# Patient Record
Sex: Female | Born: 1945
Health system: Southern US, Community
[De-identification: ages and names within clinical notes are randomized; demographics above are authoritative.]

## PROBLEM LIST (undated history)

## (undated) DIAGNOSIS — D649 Anemia, unspecified: Secondary | ICD-10-CM

## (undated) DIAGNOSIS — Z803 Family history of malignant neoplasm of breast: Secondary | ICD-10-CM

## (undated) DIAGNOSIS — C50919 Malignant neoplasm of unspecified site of unspecified female breast: Secondary | ICD-10-CM

## (undated) DIAGNOSIS — I8390 Asymptomatic varicose veins of unspecified lower extremity: Secondary | ICD-10-CM

## (undated) DIAGNOSIS — E785 Hyperlipidemia, unspecified: Secondary | ICD-10-CM

## (undated) DIAGNOSIS — E669 Obesity, unspecified: Secondary | ICD-10-CM

## (undated) DIAGNOSIS — Z87891 Personal history of nicotine dependence: Secondary | ICD-10-CM

## (undated) DIAGNOSIS — N6019 Diffuse cystic mastopathy of unspecified breast: Secondary | ICD-10-CM

## (undated) DIAGNOSIS — T884XXA Failed or difficult intubation, initial encounter: Secondary | ICD-10-CM

## (undated) DIAGNOSIS — C73 Malignant neoplasm of thyroid gland: Secondary | ICD-10-CM

## (undated) HISTORY — PX: BREAST LUMPECTOMY: SHX2

## (undated) HISTORY — DX: Asymptomatic varicose veins of unspecified lower extremity: I83.90

## (undated) HISTORY — PX: THYROID SURGERY: SHX805

## (undated) HISTORY — PX: VARICOSE VEIN SURGERY: SHX832

## (undated) HISTORY — PX: TUBAL LIGATION: SHX77

## (undated) HISTORY — DX: Malignant neoplasm of thyroid gland: C73

## (undated) HISTORY — PX: FRACTURE SURGERY: SHX138

## (undated) HISTORY — DX: Malignant neoplasm of unspecified site of unspecified female breast: C50.919

## (undated) HISTORY — DX: Personal history of nicotine dependence: Z87.891

## (undated) HISTORY — PX: BREAST SURGERY: SHX581

## (undated) HISTORY — PX: COLONOSCOPY: SHX5424

## (undated) HISTORY — DX: Obesity, unspecified: E66.9

## (undated) HISTORY — DX: Diffuse cystic mastopathy of unspecified breast: N60.19

## (undated) HISTORY — DX: Family history of malignant neoplasm of breast: Z80.3

---

## 1987-02-08 HISTORY — PX: BREAST EXCISIONAL BIOPSY: SUR124

## 2004-08-11 ENCOUNTER — Ambulatory Visit: Payer: Self-pay | Admitting: General Surgery

## 2005-09-08 ENCOUNTER — Ambulatory Visit: Payer: Self-pay | Admitting: General Surgery

## 2006-09-12 ENCOUNTER — Ambulatory Visit: Payer: Self-pay | Admitting: General Surgery

## 2007-02-08 HISTORY — PX: OTHER SURGICAL HISTORY: SHX169

## 2007-09-13 ENCOUNTER — Ambulatory Visit: Payer: Self-pay | Admitting: General Surgery

## 2008-09-15 ENCOUNTER — Ambulatory Visit: Payer: Self-pay | Admitting: General Surgery

## 2009-02-07 HISTORY — PX: COLONOSCOPY: SHX174

## 2009-09-09 ENCOUNTER — Ambulatory Visit: Payer: Self-pay | Admitting: Unknown Physician Specialty

## 2009-09-16 ENCOUNTER — Ambulatory Visit: Payer: Self-pay | Admitting: General Surgery

## 2009-09-17 ENCOUNTER — Ambulatory Visit: Payer: Self-pay | Admitting: General Surgery

## 2010-04-19 ENCOUNTER — Ambulatory Visit: Payer: Self-pay | Admitting: General Surgery

## 2010-09-21 ENCOUNTER — Ambulatory Visit: Payer: Self-pay

## 2011-11-01 ENCOUNTER — Ambulatory Visit: Payer: Self-pay

## 2011-11-04 ENCOUNTER — Ambulatory Visit: Payer: Self-pay

## 2012-03-17 ENCOUNTER — Encounter: Payer: Self-pay | Admitting: General Surgery

## 2012-05-07 ENCOUNTER — Ambulatory Visit: Payer: Self-pay

## 2012-05-15 ENCOUNTER — Ambulatory Visit (INDEPENDENT_AMBULATORY_CARE_PROVIDER_SITE_OTHER): Payer: BC Managed Care – PPO | Admitting: General Surgery

## 2012-05-15 ENCOUNTER — Encounter: Payer: Self-pay | Admitting: General Surgery

## 2012-05-15 VITALS — BP 124/80 | HR 80 | Resp 16 | Ht 66.0 in | Wt 189.0 lb

## 2012-05-15 DIAGNOSIS — Z803 Family history of malignant neoplasm of breast: Secondary | ICD-10-CM

## 2012-05-15 DIAGNOSIS — N6019 Diffuse cystic mastopathy of unspecified breast: Secondary | ICD-10-CM

## 2012-05-15 DIAGNOSIS — Z1231 Encounter for screening mammogram for malignant neoplasm of breast: Secondary | ICD-10-CM

## 2012-05-15 DIAGNOSIS — R920 Mammographic microcalcification found on diagnostic imaging of breast: Secondary | ICD-10-CM | POA: Insufficient documentation

## 2012-05-15 NOTE — Patient Instructions (Addendum)
Patient  To return in five month with bilateral screening mammogram.

## 2012-05-15 NOTE — Progress Notes (Signed)
Patient ID: Tanya Frey, female   DOB: 1945/03/11, 67 y.o.   MRN: [id number]  Chief Complaint  Patient presents with  . Follow-up    mammogram     HPI Tanya Frey is a 67 y.o. female here today for her follow up mammogram done @ [facility] on 05/07/12 cat 2. No new breast problems. She has FCD and family history of breast cancer. 6 months ago she had faint microcalcifications-was felt to cat 3.  HPI  Past Medical History  Diagnosis Date  . Personal history of tobacco use, presenting hazards to health   . Varicose veins   . Asthma   . Diffuse cystic mastopathy   . Breast screening, unspecified   . Mammographic microcalcification 2013    left breast cluster of calcifications are very faint   . Special screening for malignant neoplasms, colon   . Obesity, unspecified   . Family history of malignant neoplasm of breast     Past Surgical History  Procedure Laterality Date  . Vein closure procedure Right 2009  . Varicose vein surgery    . Colonoscopy  2011    Dr. Mechele Collin; colon polyps (tubular adenoma)  . Tubal ligation    . Breast biopsy Bilateral 1989    Family History  Problem Relation Age of Onset  . Breast cancer Daughter     Social History History  Substance Use Topics  . Smoking status: Former Smoker    Quit date: 02/07/1990  . Smokeless tobacco: Never Used  . Alcohol Use: .5 - 2 oz/week    1-4 drink(s) per week     Comment: wine    No Known Allergies  Current Outpatient Prescriptions  Medication Sig Dispense Refill  . simvastatin (ZOCOR) 40 MG tablet Take 40 mg by mouth daily.       No current facility-administered medications for this visit.    Review of Systems Review of Systems  Constitutional: Negative.   Respiratory: Negative.   Cardiovascular: Negative.     Blood pressure 124/80, pulse 80, resp. rate 16, height 5\' 6"  (1.676 m), weight 189 lb (85.73 kg).  Physical Exam Physical Exam  Constitutional: She appears well-developed and  well-nourished.  Neck: Neck supple.  Pulmonary/Chest: Effort normal and breath sounds normal. Right breast exhibits no inverted nipple, no mass, no nipple discharge, no skin change and no tenderness. Left breast exhibits no inverted nipple, no mass, no nipple discharge, no skin change and no tenderness.  Lymphadenopathy:    She has no cervical adenopathy.    She has no axillary adenopathy.    Data Reviewed Mammogram shows no change in there faint microcalcifications  Assessment    Stable exam.     Plan    6 mo f/u with bilammogram        SANKAR,SEEPLAPUTHUR G 05/15/2012, 8:05 PM

## 2012-11-01 ENCOUNTER — Ambulatory Visit: Payer: Self-pay | Admitting: General Surgery

## 2012-11-05 ENCOUNTER — Encounter: Payer: Self-pay | Admitting: General Surgery

## 2012-11-07 ENCOUNTER — Ambulatory Visit: Payer: BC Managed Care – PPO | Admitting: General Surgery

## 2012-11-13 ENCOUNTER — Encounter: Payer: Self-pay | Admitting: General Surgery

## 2012-11-13 ENCOUNTER — Ambulatory Visit (INDEPENDENT_AMBULATORY_CARE_PROVIDER_SITE_OTHER): Payer: BC Managed Care – PPO | Admitting: General Surgery

## 2012-11-13 VITALS — BP 142/90 | HR 78 | Resp 14 | Ht 66.0 in | Wt 191.0 lb

## 2012-11-13 DIAGNOSIS — Z803 Family history of malignant neoplasm of breast: Secondary | ICD-10-CM

## 2012-11-13 DIAGNOSIS — N6019 Diffuse cystic mastopathy of unspecified breast: Secondary | ICD-10-CM

## 2012-11-13 NOTE — Patient Instructions (Addendum)
Continue self breast exams. Call office for any new breast issues or concerns. Follow up one year bilateral screening mammogram and office visit

## 2012-11-13 NOTE — Progress Notes (Signed)
Patient ID: Tanya Frey, female   DOB: 1945/11/14, 67 y.o.   MRN: [id number]  Chief Complaint  Patient presents with  . Follow-up    mammogram    HPI Tanya Frey is a 67 y.o. female who presents for a breast evaluation. The most recent mammogram was done on 10/29/12.Patient does perform regular self breast checks and gets regular mammograms done.  She denies any new problems at this time.   HPI  Past Medical History  Diagnosis Date  . Personal history of tobacco use, presenting hazards to health   . Varicose veins   . Asthma   . Diffuse cystic mastopathy   . Breast screening, unspecified   . Mammographic microcalcification 2013    left breast cluster of calcifications are very faint   . Special screening for malignant neoplasms, colon   . Obesity, unspecified   . Family history of malignant neoplasm of breast     Past Surgical History  Procedure Laterality Date  . Vein closure procedure Right 2009  . Varicose vein surgery    . Colonoscopy  2011    Dr. Mechele Collin; colon polyps (tubular adenoma)  . Tubal ligation    . Breast biopsy Bilateral 1989    Family History  Problem Relation Age of Onset  . Breast cancer Daughter     Social History History  Substance Use Topics  . Smoking status: Former Smoker    Quit date: 02/07/1990  . Smokeless tobacco: Never Used  . Alcohol Use: .5 - 2 oz/week    1-4 drink(s) per week     Comment: wine    No Known Allergies  Current Outpatient Prescriptions  Medication Sig Dispense Refill  . simvastatin (ZOCOR) 40 MG tablet Take 40 mg by mouth daily.       No current facility-administered medications for this visit.    Review of Systems Review of Systems  Constitutional: Negative.   Respiratory: Negative.   Cardiovascular: Negative.     Blood pressure 142/90, pulse 78, resp. rate 14, height 5\' 6"  (1.676 m), weight 191 lb (86.637 kg).  Physical Exam Physical Exam  Constitutional: She is oriented to person, place, and  time. She appears well-developed and well-nourished.  Eyes: Conjunctivae are normal. No scleral icterus.  Neck: Neck supple.  Cardiovascular: Normal rate, regular rhythm and normal heart sounds.   Pulmonary/Chest: Effort normal and breath sounds normal. Right breast exhibits no inverted nipple, no mass, no nipple discharge, no skin change and no tenderness. Left breast exhibits no inverted nipple, no mass, no nipple discharge, no skin change and no tenderness.  Abdominal: Soft. There is no hepatosplenomegaly. There is no tenderness.  Lymphadenopathy:    She has no cervical adenopathy.    She has no axillary adenopathy.  Neurological: She is alert and oriented to person, place, and time.  Skin: Skin is warm and dry.    Data Reviewed Mammogram stable with micro calcifications left breast.  Assessment    Stable    Plan    Follow up one year bilateral screening mammogram and office visit.       Tanya Frey 11/13/2012, 6:39 PM

## 2013-06-14 DIAGNOSIS — D649 Anemia, unspecified: Secondary | ICD-10-CM | POA: Insufficient documentation

## 2013-06-14 DIAGNOSIS — E785 Hyperlipidemia, unspecified: Secondary | ICD-10-CM | POA: Insufficient documentation

## 2013-11-19 ENCOUNTER — Ambulatory Visit: Payer: Self-pay | Admitting: General Surgery

## 2013-11-21 ENCOUNTER — Encounter: Payer: Self-pay | Admitting: General Surgery

## 2013-11-27 ENCOUNTER — Encounter: Payer: Self-pay | Admitting: General Surgery

## 2013-11-27 ENCOUNTER — Ambulatory Visit (INDEPENDENT_AMBULATORY_CARE_PROVIDER_SITE_OTHER): Payer: Commercial Managed Care - HMO | Admitting: General Surgery

## 2013-11-27 VITALS — BP 100/64 | HR 72 | Resp 12 | Ht 65.0 in | Wt 184.0 lb

## 2013-11-27 DIAGNOSIS — Z803 Family history of malignant neoplasm of breast: Secondary | ICD-10-CM

## 2013-11-27 DIAGNOSIS — N6019 Diffuse cystic mastopathy of unspecified breast: Secondary | ICD-10-CM | POA: Insufficient documentation

## 2013-11-27 NOTE — Patient Instructions (Signed)
The patient has been asked to return to the office in one year with a bilateral screening mammogram. Continue self breast exams. Call office for any new breast issues or concerns.  

## 2013-11-27 NOTE — Progress Notes (Signed)
Patient ID: Tanya Frey, female   DOB: May 01, 1945, 68 y.o.   MRN: [id number]  Chief Complaint  Patient presents with  . Follow-up    mammogram    HPI Tanya Frey is a 68 y.o. female who presents for a breast evaluation. The most recent mammogram was done on 11/19/13.  Patient does perform regular self breast checks and gets regular mammograms done.  No new breast issues.  HPI  Past Medical History  Diagnosis Date  . Personal history of tobacco use, presenting hazards to health   . Varicose veins   . Asthma   . Diffuse cystic mastopathy   . Obesity, unspecified   . Family history of malignant neoplasm of breast     Past Surgical History  Procedure Laterality Date  . Vein closure procedure Right 2009  . Varicose vein surgery    . Colonoscopy  2011    Dr. [name]; colon polyps (tubular adenoma)  . Tubal ligation    . Breast biopsy Bilateral 1989    Family History  Problem Relation Age of Onset  . Breast cancer Daughter     [name]    Social History History  Substance Use Topics  . Smoking status: Former Smoker    Quit date: 02/07/1990  . Smokeless tobacco: Never Used  . Alcohol Use: 0.5 - 2.0 oz/week    1-4 drink(s) per week     Comment: wine    No Known Allergies  Current Outpatient Prescriptions  Medication Sig Dispense Refill  . Calcium-Vitamin D 600-200 MG-UNIT per tablet Take by mouth.      . simvastatin (ZOCOR) 40 MG tablet Take 40 mg by mouth daily.       No current facility-administered medications for this visit.    Review of Systems Review of Systems  Respiratory: Negative.   Cardiovascular: Negative.     Blood pressure 100/64, pulse 72, resp. rate 12, height 5\' 5"  (1.651 m), weight 184 lb (83.462 kg).  Physical Exam Physical Exam  Constitutional: She is oriented to person, place, and time. She appears well-developed and well-nourished.  Eyes: Conjunctivae are normal. No scleral icterus.  Neck: Neck supple.   Cardiovascular: Normal rate, regular rhythm and normal heart sounds.   Pulmonary/Chest: Effort normal and breath sounds normal. Right breast exhibits no inverted nipple, no mass, no nipple discharge, no skin change and no tenderness. Left breast exhibits no inverted nipple, no mass, no nipple discharge, no skin change and no tenderness.  Abdominal: Soft. Bowel sounds are normal. There is no hepatomegaly. There is no tenderness.  Lymphadenopathy:    She has no cervical adenopathy.    She has no axillary adenopathy.  Neurological: She is alert and oriented to person, place, and time.  Skin: Skin is warm and dry.    Data Reviewed Mammogram reviewed and stable.  Assessment    Stable physical exam. FCD. FH breast cancer.     Plan    The patient has been asked to return to the office in one year with a bilateral screening mammogram.     PCP: Ivin Poot 11/27/2013, 4:21 PM

## 2013-12-09 ENCOUNTER — Encounter: Payer: Self-pay | Admitting: General Surgery

## 2014-06-11 DIAGNOSIS — D649 Anemia, unspecified: Secondary | ICD-10-CM | POA: Diagnosis not present

## 2014-06-11 DIAGNOSIS — E780 Pure hypercholesterolemia: Secondary | ICD-10-CM | POA: Diagnosis not present

## 2014-06-17 DIAGNOSIS — Z23 Encounter for immunization: Secondary | ICD-10-CM | POA: Diagnosis not present

## 2014-06-17 DIAGNOSIS — Z0001 Encounter for general adult medical examination with abnormal findings: Secondary | ICD-10-CM | POA: Diagnosis not present

## 2014-08-29 DIAGNOSIS — Z86010 Personal history of colonic polyps: Secondary | ICD-10-CM | POA: Diagnosis not present

## 2014-09-22 ENCOUNTER — Other Ambulatory Visit: Payer: Self-pay

## 2014-09-22 DIAGNOSIS — Z1231 Encounter for screening mammogram for malignant neoplasm of breast: Secondary | ICD-10-CM

## 2014-10-09 ENCOUNTER — Encounter: Payer: Self-pay | Admitting: *Deleted

## 2014-10-10 ENCOUNTER — Ambulatory Visit: Payer: Commercial Managed Care - HMO | Admitting: Anesthesiology

## 2014-10-10 ENCOUNTER — Ambulatory Visit
Admission: RE | Admit: 2014-10-10 | Discharge: 2014-10-10 | Disposition: A | Discharge status: Home / Self Care | Payer: Commercial Managed Care - HMO | Source: Ambulatory Visit | Attending: Unknown Physician Specialty | Admitting: Unknown Physician Specialty

## 2014-10-10 ENCOUNTER — Encounter
Admission: RE | Disposition: A | Discharge status: Home / Self Care | Payer: Self-pay | Source: Ambulatory Visit | Attending: Unknown Physician Specialty

## 2014-10-10 DIAGNOSIS — Z09 Encounter for follow-up examination after completed treatment for conditions other than malignant neoplasm: Secondary | ICD-10-CM | POA: Insufficient documentation

## 2014-10-10 DIAGNOSIS — K649 Unspecified hemorrhoids: Secondary | ICD-10-CM | POA: Diagnosis not present

## 2014-10-10 DIAGNOSIS — Z87891 Personal history of nicotine dependence: Secondary | ICD-10-CM | POA: Diagnosis not present

## 2014-10-10 DIAGNOSIS — E669 Obesity, unspecified: Secondary | ICD-10-CM | POA: Diagnosis not present

## 2014-10-10 DIAGNOSIS — Z803 Family history of malignant neoplasm of breast: Secondary | ICD-10-CM | POA: Insufficient documentation

## 2014-10-10 DIAGNOSIS — Z79899 Other long term (current) drug therapy: Secondary | ICD-10-CM | POA: Insufficient documentation

## 2014-10-10 DIAGNOSIS — K640 First degree hemorrhoids: Secondary | ICD-10-CM | POA: Insufficient documentation

## 2014-10-10 DIAGNOSIS — Z86010 Personal history of colonic polyps: Secondary | ICD-10-CM | POA: Diagnosis not present

## 2014-10-10 DIAGNOSIS — I8390 Asymptomatic varicose veins of unspecified lower extremity: Secondary | ICD-10-CM | POA: Insufficient documentation

## 2014-10-10 DIAGNOSIS — D125 Benign neoplasm of sigmoid colon: Secondary | ICD-10-CM | POA: Diagnosis not present

## 2014-10-10 DIAGNOSIS — K635 Polyp of colon: Secondary | ICD-10-CM | POA: Diagnosis not present

## 2014-10-10 DIAGNOSIS — Z7982 Long term (current) use of aspirin: Secondary | ICD-10-CM | POA: Insufficient documentation

## 2014-10-10 DIAGNOSIS — D649 Anemia, unspecified: Secondary | ICD-10-CM | POA: Insufficient documentation

## 2014-10-10 DIAGNOSIS — Z6829 Body mass index (BMI) 29.0-29.9, adult: Secondary | ICD-10-CM | POA: Diagnosis not present

## 2014-10-10 DIAGNOSIS — J45909 Unspecified asthma, uncomplicated: Secondary | ICD-10-CM | POA: Diagnosis not present

## 2014-10-10 HISTORY — DX: Anemia, unspecified: D64.9

## 2014-10-10 HISTORY — PX: COLONOSCOPY WITH PROPOFOL: SHX5780

## 2014-10-10 SURGERY — COLONOSCOPY WITH PROPOFOL
Anesthesia: General

## 2014-10-10 MED ORDER — LIDOCAINE HCL (CARDIAC) 20 MG/ML IV SOLN
INTRAVENOUS | Status: DC | PRN
Start: 2014-10-10 — End: 2014-10-10
  Administered 2014-10-10: 60 mg via INTRAVENOUS

## 2014-10-10 MED ORDER — PROPOFOL 10 MG/ML IV BOLUS
INTRAVENOUS | Status: DC | PRN
  Administered 2014-10-10: 70 mg via INTRAVENOUS

## 2014-10-10 MED ORDER — PROPOFOL INFUSION 10 MG/ML OPTIME
INTRAVENOUS | Status: DC | PRN
  Administered 2014-10-10: 150 ug/kg/min via INTRAVENOUS

## 2014-10-10 MED ORDER — SODIUM CHLORIDE 0.9 % IV SOLN: INTRAVENOUS | Status: DC

## 2014-10-10 MED ORDER — SODIUM CHLORIDE 0.9 % IV SOLN
INTRAVENOUS | Status: DC
  Administered 2014-10-10: 1000 mL via INTRAVENOUS
  Administered 2014-10-10: 11:00:00 via INTRAVENOUS

## 2014-10-10 MED ORDER — FENTANYL CITRATE (PF) 100 MCG/2ML IJ SOLN
INTRAMUSCULAR | Status: DC | PRN
  Administered 2014-10-10: 50 ug via INTRAVENOUS

## 2014-10-10 MED ORDER — EPHEDRINE SULFATE 50 MG/ML IJ SOLN
INTRAMUSCULAR | Status: DC | PRN
  Administered 2014-10-10 (×2): 5 mg via INTRAVENOUS

## 2014-10-10 NOTE — Transfer of Care (Signed)
Immediate Anesthesia Transfer of Care Note  Patient: Tanya Frey  Procedure(s) Performed: Procedure(s): COLONOSCOPY WITH PROPOFOL (N/A)  Patient Location: PACU and Endoscopy Unit  Anesthesia Type:General  Level of Consciousness: awake  Airway & Oxygen Therapy: Patient Spontanous Breathing  Post-op Assessment: Report given to RN  Post vital signs: stable  Last Vitals:  Filed Vitals:   10/10/14 1013  BP: 138/66  Pulse: 72  Temp: 35.8 C  Resp: 18    Complications: No apparent anesthesia complications

## 2014-10-10 NOTE — H&P (Signed)
   Primary Care Physician:  Madelyn Brunner, MD Primary Gastroenterologist:  Dr. [name]  Pre-Procedure History & Physical: HPI:  Tanya Frey is a 69 y.o. female is here for an colonoscopy.   Past Medical History  Diagnosis Date  . Personal history of tobacco use, presenting hazards to health   . Varicose veins   . Asthma   . Diffuse cystic mastopathy   . Obesity, unspecified   . Family history of malignant neoplasm of breast   . Anemia     Past Surgical History  Procedure Laterality Date  . Vein closure procedure Right 2009  . Varicose vein surgery    . Colonoscopy  2011    Dr. [name]; colon polyps (tubular adenoma)  . Tubal ligation    . Breast biopsy Bilateral 1989    Prior to Admission medications   Medication Sig Start Date End Date Taking? Authorizing Provider  aspirin EC 81 MG tablet Take 81 mg by mouth daily.   Yes Historical Provider, MD  Calcium-Vitamin D 600-200 MG-UNIT per tablet Take by mouth.    Historical Provider, MD  simvastatin (ZOCOR) 40 MG tablet Take 40 mg by mouth daily.    Historical Provider, MD    Allergies as of 09/09/2014  . (No Known Allergies)    Family History  Problem Relation Age of Onset  . Breast cancer Daughter     [name]    Social History   Social History  . Marital Status: Married    Spouse Name: N/A  . Number of Children: N/A  . Years of Education: N/A   Occupational History  . Not on file.   Social History Main Topics  . Smoking status: Former Smoker -- 1.00 packs/day for 10 years    Quit date: 02/07/1990  . Smokeless tobacco: Never Used  . Alcohol Use: 0.5 - 2.0 oz/week    1-4 Standard drinks or equivalent per week     Comment: wine  . Drug Use: No  . Sexual Activity: Not on file   Other Topics Concern  . Not on file   Social History Narrative    Review of Systems: See HPI, otherwise negative ROS  Physical Exam: BP 138/66 mmHg  Pulse 72  Temp(Src) 96.4 F (35.8 C) (Tympanic)  Resp  18  Ht 5\' 5"  (1.651 m)  Wt 81.647 kg (180 lb)  BMI 29.95 kg/m2  SpO2 100% General:   Alert,  pleasant and cooperative in NAD Head:  Normocephalic and atraumatic. Neck:  Supple; no masses or thyromegaly. Lungs:  Clear throughout to auscultation.    Heart:  Regular rate and rhythm. Abdomen:  Soft, nontender and nondistended. Normal bowel sounds, without guarding, and without rebound.   Neurologic:  Alert and  oriented x4;  grossly normal neurologically.  Impression/Plan: Tanya Frey is here for an colonoscopy to be performed for [facility] colon polyps  Risks, benefits, limitations, and alternatives regarding  colonoscopy have been reviewed with the patient.  Questions have been answered.  All parties agreeable.   Gaylyn Cheers, MD  10/10/2014, 11:17 AM

## 2014-10-10 NOTE — Op Note (Signed)
[facility] Gastroenterology Patient Name: Tanya Frey Procedure Date: 10/10/2014 11:11 AM MRN: [id number] Account #: [id number] Date of Birth: Jun 03, 1945 Admit Type: Outpatient Age: 69 Room: [facility] ENDO ROOM 1 Gender: Female Note Status: Finalized Procedure:         Colonoscopy Indications:       Follow-up for history of adenomatous polyps in the colon Providers:         [name], MD Referring MD:      Hewitt Blade. Sarina Ser, MD (Referring MD) Medicines:         Propofol per Anesthesia Complications:     No immediate complications. Procedure:         Pre-Anesthesia Assessment:                    - After reviewing the risks and benefits, the patient was                     deemed in satisfactory condition to undergo the procedure.                    After obtaining informed consent, the colonoscope was                     passed under direct vision. Throughout the procedure, the                     patient's blood pressure, pulse, and oxygen saturations                     were monitored continuously. The Colonoscope was                     introduced through the anus and advanced to the the cecum,                     identified by appendiceal orifice and ileocecal valve. The                     colonoscopy was performed without difficulty. The patient                     tolerated the procedure well. The quality of the bowel                     preparation was excellent. Findings:      Two sessile polyps were found in the sigmoid colon. The polyps were       diminutive in size. These polyps were removed with a jumbo cold forceps.       Resection and retrieval were complete.      Internal hemorrhoids were found during endoscopy. The hemorrhoids were       small and Grade I (internal hemorrhoids that do not prolapse). Impression:        - Two diminutive polyps in the sigmoid colon. Resected and                     retrieved.                    - Internal  hemorrhoids. Recommendation:    - Await pathology results. [name], MD 10/10/2014 11:47:57 AM This report has been signed electronically. Number of Addenda: 0 Note Initiated On: 10/10/2014 11:11 AM Scope Withdrawal Time: 0 hours 13 minutes  11 seconds  Total Procedure Duration: 0 hours 19 minutes 46 seconds       [facility]

## 2014-10-10 NOTE — Anesthesia Preprocedure Evaluation (Signed)
Anesthesia Evaluation  Patient identified by MRN, date of birth, ID band Patient awake    Reviewed: Allergy & Precautions, NPO status , Patient's Chart, lab work & pertinent test results  History of Anesthesia Complications (+) PONV  Airway Mallampati: IV  TM Distance: <3 FB    Comment: Small mouth Dental  (+) Chipped   Pulmonary asthma , former smoker,  breath sounds clear to auscultation  Pulmonary exam normal       Cardiovascular negative cardio ROS Normal cardiovascular exam    Neuro/Psych negative neurological ROS  negative psych ROS   GI/Hepatic negative GI ROS, Neg liver ROS,   Endo/Other  negative endocrine ROS  Renal/GU negative Renal ROS  negative genitourinary   Musculoskeletal negative musculoskeletal ROS (+)   Abdominal Normal abdominal exam  (+)   Peds negative pediatric ROS (+)  Hematology  (+) anemia ,   Anesthesia Other Findings Varicose veins  Reproductive/Obstetrics                             Anesthesia Physical Anesthesia Plan  ASA: II  Anesthesia Plan: General   Post-op Pain Management:    Induction: Intravenous  Airway Management Planned: Nasal Cannula  Additional Equipment:   Intra-op Plan:   Post-operative Plan:   Informed Consent: I have reviewed the patients History and Physical, chart, labs and discussed the procedure including the risks, benefits and alternatives for the proposed anesthesia with the patient or authorized representative who has indicated his/her understanding and acceptance.   Dental advisory given  Plan Discussed with: CRNA and Surgeon  Anesthesia Plan Comments:         Anesthesia Quick Evaluation

## 2014-10-14 ENCOUNTER — Encounter: Payer: Self-pay | Admitting: Unknown Physician Specialty

## 2014-10-14 LAB — SURGICAL PATHOLOGY

## 2014-10-14 NOTE — Anesthesia Postprocedure Evaluation (Signed)
  Anesthesia Post-op Note  Patient: Tanya Frey  Procedure(s) Performed: Procedure(s): COLONOSCOPY WITH PROPOFOL (N/A)  Anesthesia type:General  Patient location: PACU  Post pain: Pain level controlled  Post assessment: Post-op Vital signs reviewed, Patient's Cardiovascular Status Stable, Respiratory Function Stable, Patent Airway and No signs of Nausea or vomiting  Post vital signs: Reviewed and stable  Last Vitals:  Filed Vitals:   10/10/14 1220  BP: 106/67  Pulse: 71  Temp:   Resp: 16    Level of consciousness: awake, alert  and patient cooperative  Complications: No apparent anesthesia complications

## 2014-11-21 ENCOUNTER — Other Ambulatory Visit: Payer: Self-pay | Admitting: General Surgery

## 2014-11-21 ENCOUNTER — Ambulatory Visit
Admission: RE | Admit: 2014-11-21 | Discharge: 2014-11-21 | Disposition: A | Discharge status: Home / Self Care | Payer: Commercial Managed Care - HMO | Source: Ambulatory Visit | Attending: General Surgery | Admitting: General Surgery

## 2014-11-21 DIAGNOSIS — Z1231 Encounter for screening mammogram for malignant neoplasm of breast: Secondary | ICD-10-CM | POA: Insufficient documentation

## 2014-11-26 ENCOUNTER — Other Ambulatory Visit: Payer: Self-pay | Admitting: General Surgery

## 2014-11-26 DIAGNOSIS — R928 Other abnormal and inconclusive findings on diagnostic imaging of breast: Secondary | ICD-10-CM

## 2014-11-27 ENCOUNTER — Ambulatory Visit
Admission: RE | Admit: 2014-11-27 | Discharge: 2014-11-27 | Disposition: A | Discharge status: Home / Self Care | Payer: Commercial Managed Care - HMO | Source: Ambulatory Visit | Attending: General Surgery | Admitting: General Surgery

## 2014-11-27 DIAGNOSIS — R928 Other abnormal and inconclusive findings on diagnostic imaging of breast: Secondary | ICD-10-CM | POA: Diagnosis not present

## 2014-12-04 ENCOUNTER — Encounter: Payer: Self-pay | Admitting: General Surgery

## 2014-12-04 ENCOUNTER — Ambulatory Visit (INDEPENDENT_AMBULATORY_CARE_PROVIDER_SITE_OTHER): Payer: Commercial Managed Care - HMO | Admitting: General Surgery

## 2014-12-04 VITALS — BP 124/72 | HR 68 | Resp 12 | Ht 65.0 in | Wt 188.0 lb

## 2014-12-04 DIAGNOSIS — N6019 Diffuse cystic mastopathy of unspecified breast: Secondary | ICD-10-CM | POA: Diagnosis not present

## 2014-12-04 DIAGNOSIS — Z803 Family history of malignant neoplasm of breast: Secondary | ICD-10-CM

## 2014-12-04 NOTE — Progress Notes (Signed)
Patient ID: Tanya Frey, female   DOB: 1945-09-28, 69 y.o.   MRN: [id number]  Chief Complaint  Patient presents with  . Follow-up    mammmgram    HPI Tanya Frey is a 69 y.o. female who presents for a breast evaluation. The most recent mammogram was done on .  Patient does perform regular self breast checks and gets regular mammograms done.  I have reviewed the history of present illness with the patient.   HPI   Past Medical History  Diagnosis Date  . Personal history of tobacco use, presenting hazards to health   . Varicose veins   . Asthma   . Diffuse cystic mastopathy   . Obesity, unspecified   . Family history of malignant neoplasm of breast   . Anemia     Past Surgical History  Procedure Laterality Date  . Vein closure procedure Right 2009  . Varicose vein surgery    . Colonoscopy  2011    Dr. [name]; colon polyps (tubular adenoma)  . Tubal ligation    . Colonoscopy with propofol N/A 10/10/2014    Procedure: COLONOSCOPY WITH PROPOFOL;  Surgeon: [name], MD;  Location: [facility] ENDOSCOPY;  Service: Endoscopy;  Laterality: N/A;  . Breast biopsy Bilateral 1989    excisional    Family History  Problem Relation Age of Onset  . Breast cancer Daughter     Tanya Frey    Social History Social History  Substance Use Topics  . Smoking status: Former Smoker -- 1.00 packs/day for 10 years    Quit date: 02/07/1990  . Smokeless tobacco: Never Used  . Alcohol Use: 0.5 - 2.0 oz/week    1-4 Standard drinks or equivalent per week     Comment: wine    No Known Allergies  Current Outpatient Prescriptions  Medication Sig Dispense Refill  . aspirin EC 81 MG tablet Take 81 mg by mouth daily.    . Calcium-Vitamin D 600-200 MG-UNIT per tablet Take by mouth.    . simvastatin (ZOCOR) 40 MG tablet Take 40 mg by mouth daily.     No current facility-administered medications for this visit.    Review of Systems Review of Systems  Constitutional: Negative.    Respiratory: Negative.   Cardiovascular: Negative.     Blood pressure 124/72, pulse 68, resp. rate 12, height 5\' 5"  (1.651 m), weight 188 lb (85.276 kg).  Physical Exam Physical Exam  Constitutional: She is oriented to person, place, and time. She appears well-developed and well-nourished.  Eyes: Conjunctivae are normal. No scleral icterus.  Neck: Neck supple.  Cardiovascular: Normal rate, regular rhythm and normal heart sounds.   Pulmonary/Chest: Effort normal and breath sounds normal. Right breast exhibits no inverted nipple, no mass, no nipple discharge, no skin change and no tenderness. Left breast exhibits no inverted nipple, no mass, no nipple discharge, no skin change and no tenderness.  Abdominal: Soft. Bowel sounds are normal. There is no tenderness.  Lymphadenopathy:    She has no cervical adenopathy.    She has no axillary adenopathy.  Neurological: She is alert and oriented to person, place, and time.  Skin: Skin is warm and dry.    Data Reviewed Mammogram reports reviewed, no suspicious findings.   Assessment    Stable exam, history of fibrocystic disease and family history of breast cancer.     Plan    Patient will be asked to return to the office in one year with a bilateral screening mammogram.  PCP:  Fulton Reek 12/04/2014, 9:08 AM

## 2014-12-04 NOTE — Patient Instructions (Addendum)
Patient will be asked to return to the office in one year with a bilateral screening mammogram. Continue monthly self breast exams. Call if any concerns arise.

## 2015-06-11 DIAGNOSIS — E7800 Pure hypercholesterolemia, unspecified: Secondary | ICD-10-CM | POA: Diagnosis not present

## 2015-06-18 DIAGNOSIS — L570 Actinic keratosis: Secondary | ICD-10-CM | POA: Diagnosis not present

## 2015-06-18 DIAGNOSIS — E7800 Pure hypercholesterolemia, unspecified: Secondary | ICD-10-CM | POA: Diagnosis not present

## 2015-06-18 DIAGNOSIS — Z0001 Encounter for general adult medical examination with abnormal findings: Secondary | ICD-10-CM | POA: Diagnosis not present

## 2015-07-21 DIAGNOSIS — L821 Other seborrheic keratosis: Secondary | ICD-10-CM | POA: Diagnosis not present

## 2015-07-21 DIAGNOSIS — D1801 Hemangioma of skin and subcutaneous tissue: Secondary | ICD-10-CM | POA: Diagnosis not present

## 2015-10-15 ENCOUNTER — Other Ambulatory Visit: Payer: Self-pay | Admitting: General Surgery

## 2015-10-15 DIAGNOSIS — Z1231 Encounter for screening mammogram for malignant neoplasm of breast: Secondary | ICD-10-CM

## 2015-10-22 ENCOUNTER — Encounter: Payer: Self-pay | Admitting: *Deleted

## 2015-11-23 ENCOUNTER — Ambulatory Visit
Admission: RE | Admit: 2015-11-23 | Discharge: 2015-11-23 | Disposition: A | Discharge status: Home / Self Care | Payer: Commercial Managed Care - HMO | Source: Ambulatory Visit | Attending: General Surgery | Admitting: General Surgery

## 2015-11-23 DIAGNOSIS — Z1231 Encounter for screening mammogram for malignant neoplasm of breast: Secondary | ICD-10-CM | POA: Diagnosis not present

## 2015-11-24 ENCOUNTER — Encounter: Payer: Self-pay | Admitting: *Deleted

## 2015-11-30 ENCOUNTER — Ambulatory Visit (INDEPENDENT_AMBULATORY_CARE_PROVIDER_SITE_OTHER): Payer: Commercial Managed Care - HMO | Admitting: General Surgery

## 2015-11-30 ENCOUNTER — Encounter: Payer: Self-pay | Admitting: General Surgery

## 2015-11-30 VITALS — BP 122/74 | HR 74 | Resp 12 | Ht 65.0 in | Wt 199.0 lb

## 2015-11-30 DIAGNOSIS — N6011 Diffuse cystic mastopathy of right breast: Secondary | ICD-10-CM | POA: Diagnosis not present

## 2015-11-30 DIAGNOSIS — N6012 Diffuse cystic mastopathy of left breast: Secondary | ICD-10-CM

## 2015-11-30 DIAGNOSIS — Z86010 Personal history of colonic polyps: Secondary | ICD-10-CM

## 2015-11-30 DIAGNOSIS — Z803 Family history of malignant neoplasm of breast: Secondary | ICD-10-CM

## 2015-11-30 NOTE — Progress Notes (Signed)
Patient ID: Tanya Frey, female   DOB: [birth year], 70 y.o.   MRN: [id number]  Chief Complaint  Patient presents with  . Follow-up    mammogram    HPI Tanya Frey is a 70 y.o. female who presents for a breast evaluation. The most recent mammogram was done on 11/23/15. Patient does perform regular self breast checks and gets regular mammograms done. She states that she is doing well.  I have reviewed the history of present illness with the patient.   HPI  Past Medical History:  Diagnosis Date  . Anemia   . Asthma   . Diffuse cystic mastopathy   . Family history of malignant neoplasm of breast   . Obesity, unspecified   . Personal history of tobacco use, presenting hazards to health   . Varicose veins     Past Surgical History:  Procedure Laterality Date  . BREAST EXCISIONAL BIOPSY Bilateral 1989   neg  . COLONOSCOPY  2011   Dr. [name]; colon polyps (tubular adenoma)  . COLONOSCOPY WITH PROPOFOL N/A 10/10/2014   Procedure: COLONOSCOPY WITH PROPOFOL;  Surgeon: [name], MD;  Location: [facility] ENDOSCOPY;  Service: Endoscopy;  Laterality: N/A;  . TUBAL LIGATION    . VARICOSE VEIN SURGERY    . vein closure procedure Right 2009    Family History  Problem Relation Age of Onset  . Breast cancer Daughter     Tanya Frey    Social History Social History  Substance Use Topics  . Smoking status: Former Smoker    Packs/day: 1.00    Years: 10.00    Quit date: 02/07/1990  . Smokeless tobacco: Never Used  . Alcohol use 0.5 - 2.0 oz/week    1 - 4 Standard drinks or equivalent per week     Comment: wine    No Known Allergies  Current Outpatient Prescriptions  Medication Sig Dispense Refill  . aspirin EC 81 MG tablet Take 81 mg by mouth daily.    . Calcium-Vitamin D 600-200 MG-UNIT per tablet Take by mouth.    . simvastatin (ZOCOR) 40 MG tablet Take 40 mg by mouth daily.     No current facility-administered medications for this visit.     Review of  Systems Review of Systems  Constitutional: Negative.   Respiratory: Negative.   Cardiovascular: Negative.     Blood pressure 122/74, pulse 74, resp. rate 12, height 5\' 5"  (1.651 m), weight 199 lb (90.3 kg).  Physical Exam Physical Exam  Constitutional: She is oriented to person, place, and time. She appears well-developed and well-nourished.  Eyes: Conjunctivae are normal. No scleral icterus.  Neck: Neck supple.  Cardiovascular: Normal rate and normal heart sounds.  An irregular rhythm present.  Pulmonary/Chest: Effort normal and breath sounds normal. Right breast exhibits no inverted nipple, no mass, no nipple discharge, no skin change and no tenderness. Left breast exhibits no inverted nipple, no mass, no nipple discharge, no skin change and no tenderness.  Lymphadenopathy:    She has no cervical adenopathy.    She has no axillary adenopathy.  Neurological: She is alert and oriented to person, place, and time.  Skin: Skin is warm and dry.  Psychiatric: She has a normal mood and affect.    Data Reviewed Mammogram reviewed- stable  Assessment    History of FCD. FH of breast cancer  History of colon polyps.  Plan    Bilateral screening mammogram in one year with office follow up.  This has been scribed by Lesly Rubenstein LPN    SANKAR,SEEPLAPUTHUR G 12/01/2015, 3:19 PM

## 2015-11-30 NOTE — Patient Instructions (Signed)
Have irregular heart rhythm assessed by your PCP.

## 2015-12-01 ENCOUNTER — Encounter: Payer: Self-pay | Admitting: General Surgery

## 2015-12-08 DIAGNOSIS — Z0000 Encounter for general adult medical examination without abnormal findings: Secondary | ICD-10-CM | POA: Diagnosis not present

## 2016-06-14 DIAGNOSIS — E7800 Pure hypercholesterolemia, unspecified: Secondary | ICD-10-CM | POA: Diagnosis not present

## 2016-06-14 DIAGNOSIS — D649 Anemia, unspecified: Secondary | ICD-10-CM | POA: Diagnosis not present

## 2016-06-21 DIAGNOSIS — E2839 Other primary ovarian failure: Secondary | ICD-10-CM | POA: Diagnosis not present

## 2016-06-21 DIAGNOSIS — E7800 Pure hypercholesterolemia, unspecified: Secondary | ICD-10-CM | POA: Diagnosis not present

## 2016-06-21 DIAGNOSIS — Z0001 Encounter for general adult medical examination with abnormal findings: Secondary | ICD-10-CM | POA: Diagnosis not present

## 2016-07-05 DIAGNOSIS — M8588 Other specified disorders of bone density and structure, other site: Secondary | ICD-10-CM | POA: Diagnosis not present

## 2016-07-18 DIAGNOSIS — L821 Other seborrheic keratosis: Secondary | ICD-10-CM | POA: Diagnosis not present

## 2016-07-18 DIAGNOSIS — D225 Melanocytic nevi of trunk: Secondary | ICD-10-CM | POA: Diagnosis not present

## 2016-07-18 DIAGNOSIS — D2262 Melanocytic nevi of left upper limb, including shoulder: Secondary | ICD-10-CM | POA: Diagnosis not present

## 2016-07-18 DIAGNOSIS — D2272 Melanocytic nevi of left lower limb, including hip: Secondary | ICD-10-CM | POA: Diagnosis not present

## 2016-10-07 ENCOUNTER — Other Ambulatory Visit: Payer: Self-pay

## 2016-10-07 DIAGNOSIS — Z1231 Encounter for screening mammogram for malignant neoplasm of breast: Secondary | ICD-10-CM

## 2016-11-24 ENCOUNTER — Ambulatory Visit
Admission: RE | Admit: 2016-11-24 | Discharge: 2016-11-24 | Disposition: A | Discharge status: Home / Self Care | Payer: Commercial Managed Care - HMO | Source: Ambulatory Visit | Attending: General Surgery | Admitting: General Surgery

## 2016-11-24 DIAGNOSIS — Z1231 Encounter for screening mammogram for malignant neoplasm of breast: Secondary | ICD-10-CM | POA: Insufficient documentation

## 2016-11-30 ENCOUNTER — Other Ambulatory Visit
Admission: RE | Admit: 2016-11-30 | Discharge: 2016-11-30 | Disposition: A | Discharge status: Home / Self Care | Payer: Commercial Managed Care - HMO | Source: Ambulatory Visit | Attending: General Surgery | Admitting: General Surgery

## 2016-11-30 ENCOUNTER — Ambulatory Visit (INDEPENDENT_AMBULATORY_CARE_PROVIDER_SITE_OTHER): Payer: Medicare HMO | Admitting: General Surgery

## 2016-11-30 ENCOUNTER — Inpatient Hospital Stay: Payer: Self-pay

## 2016-11-30 ENCOUNTER — Encounter: Payer: Self-pay | Admitting: General Surgery

## 2016-11-30 VITALS — BP 136/78 | HR 80 | Resp 12 | Ht 65.0 in | Wt 169.0 lb

## 2016-11-30 DIAGNOSIS — E049 Nontoxic goiter, unspecified: Secondary | ICD-10-CM | POA: Insufficient documentation

## 2016-11-30 DIAGNOSIS — Z803 Family history of malignant neoplasm of breast: Secondary | ICD-10-CM

## 2016-11-30 DIAGNOSIS — N6012 Diffuse cystic mastopathy of left breast: Secondary | ICD-10-CM

## 2016-11-30 DIAGNOSIS — E079 Disorder of thyroid, unspecified: Secondary | ICD-10-CM | POA: Diagnosis not present

## 2016-11-30 DIAGNOSIS — N6011 Diffuse cystic mastopathy of right breast: Secondary | ICD-10-CM

## 2016-11-30 LAB — TSH: TSH: 2.684 u[IU]/mL (ref 0.350–4.500)

## 2016-11-30 NOTE — Progress Notes (Addendum)
Patient ID: Tanya Frey, female   DOB: 20-Dec-1945, 71 y.o.   MRN: [id number]  Chief Complaint  Patient presents with  . Follow-up    HPI Tanya Frey is a 71 y.o. female who presents for a breast evaluation. The most recent mammogram was done on 11/16/2016.  Patient does perform regular self breast checks and gets regular mammograms done. She has a personal hx of fibrocystic breast disease and her daughter has breast cancer.  Denies history of thyroid problems.  HPI  Past Medical History:  Diagnosis Date  . Anemia   . Asthma   . Diffuse cystic mastopathy   . Family history of malignant neoplasm of breast   . Obesity, unspecified   . Personal history of tobacco use, presenting hazards to health   . Varicose veins     Past Surgical History:  Procedure Laterality Date  . BREAST EXCISIONAL BIOPSY Bilateral 1989   neg  . COLONOSCOPY  2011   Dr. [name]; colon polyps (tubular adenoma)  . COLONOSCOPY WITH PROPOFOL N/A 10/10/2014   Procedure: COLONOSCOPY WITH PROPOFOL;  Surgeon: [name], MD;  Location: [facility] ENDOSCOPY;  Service: Endoscopy;  Laterality: N/A;  . TUBAL LIGATION    . VARICOSE VEIN SURGERY    . vein closure procedure Right 2009    Family History  Problem Relation Age of Onset  . Breast cancer Daughter        [name]    Social History Social History  Substance Use Topics  . Smoking status: Former Smoker    Packs/day: 1.00    Years: 10.00    Quit date: 02/07/1990  . Smokeless tobacco: Never Used  . Alcohol use 0.5 - 2.0 oz/week    1 - 4 Standard drinks or equivalent per week     Comment: wine    No Known Allergies  Current Outpatient Prescriptions  Medication Sig Dispense Refill  . aspirin EC 81 MG tablet Take 81 mg by mouth daily.    . Calcium-Vitamin D 600-200 MG-UNIT per tablet Take by mouth.    . simvastatin (ZOCOR) 40 MG tablet Take 40 mg by mouth daily.     No current facility-administered medications for this visit.      Review of Systems Review of Systems  Constitutional: Negative.   Respiratory: Negative.   Cardiovascular: Negative.   Gastrointestinal: Negative.     Blood pressure 136/78, pulse 80, resp. rate 12, height 5\' 5"  (1.651 m), weight 169 lb (76.7 kg).  Physical Exam Physical Exam  Constitutional: She is oriented to person, place, and time. She appears well-developed and well-nourished.  Eyes: Conjunctivae are normal. No scleral icterus.  Neck: Neck supple.  Right thyroid enlarged.   Cardiovascular: Normal rate, regular rhythm and normal heart sounds.   Pulmonary/Chest: Effort normal and breath sounds normal. Right breast exhibits no inverted nipple, no mass, no nipple discharge, no skin change and no tenderness. Left breast exhibits no inverted nipple, no mass, no nipple discharge, no skin change and no tenderness.  Abdominal: Soft. Bowel sounds are normal. There is no hepatomegaly. There is no tenderness. No hernia.  Lymphadenopathy:    She has no cervical adenopathy.    She has no axillary adenopathy.  Neurological: She is alert and oriented to person, place, and time.  Skin: Skin is warm and dry.    Data Reviewed Mammogram- no acute changes, no concerning findings for malignancy.  Previous thyroid labs- last TSH 3.243 on 06/14/2016   Assessment  Fibrocystic breast disease, mammogram not concerning at this time, stable.  Right thyroid enlargement- right thyroid was enlarged on physical exam. Today pt consented to in office ultrasound evaluation of her thyroid and subsequent FNA of right thyroid nodule.  US revealed a dominant 3.30 x 2.65 cm  heterogeneous thyroid nodule on the right with calcifications and an ill-defined 1.28 x 1.06 cm thyroid nodule on the left also with  Calcifications.  [place] assisted FNA of right thyroid nodule:  Biopsy site was cleaned with alcohol pad, area infiltrated with _1 mL of 1% Xylocaine , and a 22-gauge  needle was inserted into the right thyroid  nodule under ultrasound guidance. A portion of the nodule was aspirated and sent out to cytology after smears were made. A band-aid was placed on biopsy site. Pt tolerated procedure well.     Plan       Patient will be asked to return to the office in one year with a bilateral screening mammogram with Dr. Bary Castilla. The patient is aware to call back for any questions or concerns.  Enlarged right thyroid-  Order thyroid panel- TSH, T4. Right thyroid biopsy sent to pathology. Will discuss follow up with patient after results return.   HPI, Physical Exam, Assessment and Plan have been scribed under the direction and in the presence of Mckinley Jewel, MD  Gaspar Cola, CMA  I have completed the exam and reviewed the above documentation for accuracy and completeness.  I agree with the above.  Haematologist has been used and any errors in dictation or transcription are unintentional.  Seeplaputhur G. Jamal Collin, M.D., F.A.C.S.   Junie Panning G 11/30/2016, 10:40 AM  Patient was sent to [facility] to have the following labs drawn today: T4 and TSH.   Dominga Ferry, CMA

## 2016-11-30 NOTE — Patient Instructions (Addendum)
Patient will be asked to return to the office in one year with a bilateral screening mammogram with DR. Byrnett. The patient is aware to call back for any questions or concerns.

## 2016-12-01 LAB — T4: T4 TOTAL: 7.8 ug/dL (ref 4.5–12.0)

## 2016-12-13 ENCOUNTER — Telehealth: Payer: Self-pay | Admitting: *Deleted

## 2016-12-13 NOTE — Telephone Encounter (Signed)
Patient wants to know what results of her thyroid.

## 2016-12-14 ENCOUNTER — Telehealth: Payer: Self-pay | Admitting: *Deleted

## 2016-12-14 NOTE — Telephone Encounter (Signed)
Left message on home number and not able to leave a message on cell.   Patient needs to be scheduled for a core biopsy of the thyroid to get a definite diagnosis. This needs to be scheduled for the week of 12-26-16 or the week of 01-02-17 per Dr. Jamal Collin.

## 2016-12-14 NOTE — Telephone Encounter (Signed)
Appointment scheduled for 12-28-16 at 1:15 pm. Patient aware.

## 2016-12-28 ENCOUNTER — Ambulatory Visit: Payer: Medicare HMO | Admitting: General Surgery

## 2016-12-28 ENCOUNTER — Encounter: Payer: Self-pay | Admitting: General Surgery

## 2016-12-28 ENCOUNTER — Inpatient Hospital Stay: Payer: Self-pay

## 2016-12-28 VITALS — BP 126/72 | HR 80 | Resp 12 | Ht 65.0 in | Wt 177.0 lb

## 2016-12-28 DIAGNOSIS — C73 Malignant neoplasm of thyroid gland: Secondary | ICD-10-CM | POA: Diagnosis not present

## 2016-12-28 DIAGNOSIS — E049 Nontoxic goiter, unspecified: Secondary | ICD-10-CM

## 2016-12-28 DIAGNOSIS — E041 Nontoxic single thyroid nodule: Secondary | ICD-10-CM

## 2016-12-28 NOTE — Progress Notes (Signed)
Patient ID: Tanya Frey, female   DOB: April 16, 1945, 71 y.o.   MRN: [id number]  Chief Complaint  Patient presents with  . Procedure    HPI Tanya Frey is a 71 y.o. female.  Here today for thyroid biopsy.  FNA of the thyroid nodule showed inconclusive due to lack of cells.  Given the large size of the nodule of the right lobe core biopsy was recommended.  Patient agreeable  HPI  Past Medical History:  Diagnosis Date  . Anemia   . Asthma   . Diffuse cystic mastopathy   . Family history of malignant neoplasm of breast   . Obesity, unspecified   . Personal history of tobacco use, presenting hazards to health   . Varicose veins     Past Surgical History:  Procedure Laterality Date  . BREAST EXCISIONAL BIOPSY Bilateral 1989   neg  . COLONOSCOPY  2011   Dr. [name]; colon polyps (tubular adenoma)  . COLONOSCOPY WITH PROPOFOL N/A 10/10/2014   Procedure: COLONOSCOPY WITH PROPOFOL;  Surgeon: [name], MD;  Location: [facility] ENDOSCOPY;  Service: Endoscopy;  Laterality: N/A;  . TUBAL LIGATION    . VARICOSE VEIN SURGERY    . vein closure procedure Right 2009    Family History  Problem Relation Age of Onset  . Breast cancer Daughter        [name]    Social History Social History   Tobacco Use  . Smoking status: Former Smoker    Packs/day: 1.00    Years: 10.00    Pack years: 10.00    Last attempt to quit: 02/07/1990    Years since quitting: 26.9  . Smokeless tobacco: Never Used  Substance Use Topics  . Alcohol use: Yes    Alcohol/week: 0.5 - 2.0 oz    Types: 1 - 4 Standard drinks or equivalent per week    Comment: wine  . Drug use: No    No Known Allergies  Current Outpatient Medications  Medication Sig Dispense Refill  . aspirin EC 81 MG tablet Take 81 mg by mouth daily.    . Calcium-Vitamin D 600-200 MG-UNIT per tablet Take by mouth.    . simvastatin (ZOCOR) 40 MG tablet Take 40 mg by mouth daily.     No current facility-administered  medications for this visit.     Review of Systems Review of Systems  Constitutional: Negative.   Respiratory: Negative.   Cardiovascular: Negative.     Blood pressure 126/72, pulse 80, resp. rate 12, height 5\' 5"  (1.651 m), weight 177 lb (80.3 kg), SpO2 98 %.  Physical Exam Physical Exam  Constitutional: She is oriented to person, place, and time. She appears well-developed and well-nourished.  Neurological: She is alert and oriented to person, place, and time.  Skin: Skin is warm and dry.  Psychiatric: Her behavior is normal.    Data Reviewed Prior notes  Assessment    Dominant thyroid nodule on the right lobe and a smaller similar nodule in the left    Plan   The right thyroid region of the neck was prepped with ChloraPrep and draped out the sterile towels.  A 5 mL of 1% Xylocaine mixed with 0.5% Marcaine was instilled superior to the thyroid nodule.  Ultrasound probe with sterile, was brought up.  Small stab incision was made.  Bard 14-gauge core biopsy needle was then employed with ultrasound guidance and 3 cores were obtained from the right thyroid nodule.  Tissue was sent in  formalin to pathology.  Pressure was held minimize oozing in the small skin opening closed with Steri-Strips and tincture of benzoin. Telfa and Tegaderm dressing was placed and ice pack applied. Procedure was well-tolerated with no immediate problems encountered. Further management based on the pathology report.     HPI, Physical Exam, Assessment and Plan have been scribed under the direction and in the presence of Mckinley Jewel, MD Karie Fetch, RN I have completed the exam and reviewed the above documentation for accuracy and completeness.  I agree with the above.  Haematologist has been used and any errors in dictation or transcription are unintentional.  Sherisa Gilvin G. Jamal Collin, M.D., F.A.C.S.   Junie Panning G 01/03/2017, 6:38 AM

## 2016-12-28 NOTE — Patient Instructions (Signed)
The patient is aware to call back for any questions or concerns. May shower May remove dressing in 2-3 days May use an Ice pack as needed for comfort

## 2017-01-05 ENCOUNTER — Ambulatory Visit (INDEPENDENT_AMBULATORY_CARE_PROVIDER_SITE_OTHER): Payer: Medicare HMO | Admitting: General Surgery

## 2017-01-05 ENCOUNTER — Encounter: Payer: Self-pay | Admitting: General Surgery

## 2017-01-05 ENCOUNTER — Inpatient Hospital Stay: Payer: Self-pay

## 2017-01-05 ENCOUNTER — Other Ambulatory Visit: Payer: Self-pay | Admitting: *Deleted

## 2017-01-05 VITALS — BP 130/72 | HR 74 | Resp 14 | Ht 65.0 in | Wt 185.0 lb

## 2017-01-05 DIAGNOSIS — C73 Malignant neoplasm of thyroid gland: Secondary | ICD-10-CM

## 2017-01-05 DIAGNOSIS — E041 Nontoxic single thyroid nodule: Secondary | ICD-10-CM

## 2017-01-05 NOTE — Progress Notes (Signed)
Patient ID: Tanya Frey, female   DOB: [birth year], 71 y.o.   MRN: [id number]  Chief Complaint  Patient presents with  . Follow-up    HPI Tanya Frey is a 71 y.o. female here today to discuss her recently diagnosed thyroid cancer. She has no new complaints.                                     Marland KitchenHPI  Past Medical History:  Diagnosis Date  . Anemia   . Asthma   . Diffuse cystic mastopathy   . Family history of malignant neoplasm of breast   . Obesity, unspecified   . Personal history of tobacco use, presenting hazards to health   . Varicose veins     Past Surgical History:  Procedure Laterality Date  . BREAST EXCISIONAL BIOPSY Bilateral 1989   neg  . COLONOSCOPY  2011   Dr. [name]; colon polyps (tubular adenoma)  . COLONOSCOPY WITH PROPOFOL N/A 10/10/2014   Procedure: COLONOSCOPY WITH PROPOFOL;  Surgeon: [name], MD;  Location: [facility] ENDOSCOPY;  Service: Endoscopy;  Laterality: N/A;  . TUBAL LIGATION    . VARICOSE VEIN SURGERY    . vein closure procedure Right 2009    Family History  Problem Relation Age of Onset  . Breast cancer Daughter        [name]    Social History Social History   Tobacco Use  . Smoking status: Former Smoker    Packs/day: 1.00    Years: 10.00    Pack years: 10.00    Last attempt to quit: 02/07/1990    Years since quitting: 26.9  . Smokeless tobacco: Never Used  Substance Use Topics  . Alcohol use: Yes    Alcohol/week: 0.5 - 2.0 oz    Types: 1 - 4 Standard drinks or equivalent per week    Comment: wine  . Drug use: No    No Known Allergies  Current Outpatient Medications  Medication Sig Dispense Refill  . aspirin EC 81 MG tablet Take 81 mg by mouth daily.    . Calcium-Vitamin D 600-200 MG-UNIT per tablet Take by mouth.    . simvastatin (ZOCOR) 40 MG tablet Take 40 mg by mouth daily.     No current facility-administered medications for this visit.     Review of Systems Review of Systems  Constitutional:  Negative.   Respiratory: Negative.   Cardiovascular: Negative.     Blood pressure 130/72, pulse 74, resp. rate 14, height 5\' 5"  (1.651 m), weight 185 lb (83.9 kg).  Physical Exam Physical Exam  Constitutional: She is oriented to person, place, and time. She appears well-developed and well-nourished.  Neck: Thyromegaly (right thyroid enlarged) present.  Mild bruising on anterior neck due to biopsy.  Cardiovascular: Normal rate and regular rhythm.  Pulmonary/Chest: Effort normal.  Lymphadenopathy:    She has no cervical adenopathy.  Neurological: She is alert and oriented to person, place, and time.  Skin: Skin is warm and dry.  Psychiatric: She has a normal mood and affect. Her behavior is normal.    Data Reviewed [place], surgical pathology, labs, previous notes Surgical pathology revealed papillary thyroid carcinoma TSH 2.684, T4 7.8  Assessment    Enlarged right thyroid, subsequent US revealed dominant thyroid nodule on the right lobe and a smaller similar nodule in the left. Right nodule biopsy revealed papillary thyroid carcinoma. Today US revealed  1-2 enlarged right supracervical lymph nodes, non-palpable on exam. CT to further evaluate lymph nodes before surgery. Surgical management- thyroidectomy recommended with lymph node excision. Discussed risks and benefits to the surgery, the patient had no further questions.       Plan    Order CT. Refer to Dr. Tami Ribas, ENT for total thyroidectomy andpossible lymph node dissection.  The patient is aware to call back for any questions or concerns.      HPI, Physical Exam, Assessment and Plan have been scribed under the direction and in the presence of Mckinley Jewel, MD  Gaspar Cola, CMA  I have completed the exam and reviewed the above documentation for accuracy and completeness.  I agree with the above.  Haematologist has been used and any errors in dictation or transcription are unintentional.  Seeplaputhur G. Jamal Collin,  M.D., F.A.C.S.  Junie Panning G 01/05/2017, 9:39 AM   Patient has been scheduled for a CT neck with contrast at [place] for 01-11-17 at 11:30 am (arrive 11:15 am). Prep: no solids 4 hours prior. Patient verbalizes understanding.  The patient has also been scheduled for an appointment with Dr. Anda Latina for 01-12-17 at 2:15 pm (arrive 1:45 pm). She is aware of date, time, and instructions.   Dominga Ferry, CMA

## 2017-01-05 NOTE — Patient Instructions (Addendum)
Order CT. Refer to Dr. Tami Ribas, ENT for total thyroidectomy and lymph node excision.  The patient is aware to call back for any questions or concerns.

## 2017-01-11 ENCOUNTER — Ambulatory Visit
Admission: RE | Admit: 2017-01-11 | Discharge: 2017-01-11 | Disposition: A | Discharge status: Home / Self Care | Payer: Medicare HMO | Source: Ambulatory Visit | Attending: General Surgery | Admitting: General Surgery

## 2017-01-11 DIAGNOSIS — C73 Malignant neoplasm of thyroid gland: Secondary | ICD-10-CM | POA: Diagnosis not present

## 2017-01-11 DIAGNOSIS — E041 Nontoxic single thyroid nodule: Secondary | ICD-10-CM | POA: Insufficient documentation

## 2017-01-11 LAB — POCT I-STAT CREATININE: CREATININE: 0.7 mg/dL (ref 0.44–1.00)

## 2017-01-11 MED ORDER — IOPAMIDOL (ISOVUE-300) INJECTION 61%
75.0000 mL | Freq: Once | INTRAVENOUS | Status: AC | PRN
  Administered 2017-01-11: 75 mL via INTRAVENOUS

## 2017-01-12 DIAGNOSIS — C73 Malignant neoplasm of thyroid gland: Secondary | ICD-10-CM | POA: Diagnosis not present

## 2017-02-06 ENCOUNTER — Encounter
Admission: RE | Admit: 2017-02-06 | Discharge: 2017-02-06 | Disposition: A | Discharge status: Home / Self Care | Payer: Medicare HMO | Source: Ambulatory Visit | Attending: Unknown Physician Specialty | Admitting: Unknown Physician Specialty

## 2017-02-06 ENCOUNTER — Other Ambulatory Visit: Payer: Self-pay

## 2017-02-06 DIAGNOSIS — Z9889 Other specified postprocedural states: Secondary | ICD-10-CM | POA: Diagnosis not present

## 2017-02-06 DIAGNOSIS — Z87891 Personal history of nicotine dependence: Secondary | ICD-10-CM | POA: Insufficient documentation

## 2017-02-06 DIAGNOSIS — Z79899 Other long term (current) drug therapy: Secondary | ICD-10-CM | POA: Diagnosis not present

## 2017-02-06 DIAGNOSIS — I8390 Asymptomatic varicose veins of unspecified lower extremity: Secondary | ICD-10-CM | POA: Insufficient documentation

## 2017-02-06 DIAGNOSIS — J45909 Unspecified asthma, uncomplicated: Secondary | ICD-10-CM | POA: Diagnosis not present

## 2017-02-06 DIAGNOSIS — C73 Malignant neoplasm of thyroid gland: Secondary | ICD-10-CM | POA: Diagnosis not present

## 2017-02-06 DIAGNOSIS — D649 Anemia, unspecified: Secondary | ICD-10-CM | POA: Diagnosis not present

## 2017-02-06 DIAGNOSIS — Z7982 Long term (current) use of aspirin: Secondary | ICD-10-CM | POA: Diagnosis not present

## 2017-02-06 DIAGNOSIS — Z803 Family history of malignant neoplasm of breast: Secondary | ICD-10-CM | POA: Diagnosis not present

## 2017-02-06 DIAGNOSIS — E669 Obesity, unspecified: Secondary | ICD-10-CM | POA: Insufficient documentation

## 2017-02-06 DIAGNOSIS — Z01810 Encounter for preprocedural cardiovascular examination: Secondary | ICD-10-CM | POA: Insufficient documentation

## 2017-02-06 DIAGNOSIS — I10 Essential (primary) hypertension: Secondary | ICD-10-CM | POA: Diagnosis not present

## 2017-02-06 HISTORY — DX: Hyperlipidemia, unspecified: E78.5

## 2017-02-06 NOTE — Pre-Procedure Instructions (Signed)
INSTRUCTION TO PATIENT CHANGE FROM NO ASPIRIN 7 DAYS BEFORE SURGERY TO 10 DAYS BEFORE SURGERY

## 2017-02-06 NOTE — Patient Instructions (Signed)
Your procedure is scheduled on: February 21, 2017 Report to Same Day Surgery on the 2nd floor in the [facility]. To find out your arrival time, please call [phone] between 1PM - 3PM on: February 20, 2017 Monday   REMEMBER: Instructions that are not followed completely may result in serious medical risk, up to and including death; or upon the discretion of your surgeon and anesthesiologist your surgery may need to be rescheduled.  Do not eat food after midnight the night before your procedure.  No gum chewing or hard candies.  You may however, drink CLEAR liquids up to 2 hours before you are scheduled to arrive at the hospital for your procedure.  Do not drink clear liquids within 2 hours of the start of your surgery.  Clear liquids include: - water  - apple juice without pulp - clear gatorade - black coffee or tea (Do NOT add anything to the coffee or tea) Do NOT drink anything that is not on this list.   No Alcohol for 24 hours before or after surgery.  No Smoking including e-cigarettes for 24 hours prior to surgery. No chewable tobacco products for at least 6 hours prior to surgery. No nicotine patches on the day of surgery.  Notify your doctor if there is any change in your medical condition (cold, fever, infection).  Do not wear jewelry, make-up, hairpins, clips or nail polish.  Do not wear lotions, powders, or perfumes. You may NOT wear deodorant.  Do not shave 48 hours prior to surgery. Men may shave face and neck.  Contacts and dentures may not be worn into surgery.  Do not bring valuables to the hospital. [facility] is not responsible for any belongings or valuables.   TAKE THESE MEDICATIONS THE MORNING OF SURGERY WITH A SIP OF WATER: NONE  Follow recommendations from Cardiologist, Pulmonologist or PCP regarding stopping Aspirin, Coumadin, Plavix, Eliquis, Pradaxa, or Pletal. NO ASPIRIN AFTER February 13, 2017  Stop Anti-inflammatories such as Advil, Aleve,  Ibuprofen, Motrin, Naproxen, Naprosyn, Goodie powder, or aspirin products. (May take Tylenol or Acetaminophen if needed.) NONE AFTER February 13, 2017  Stop ANY OVER THE COUNTER supplements until after surgery. (May continue Vitamin D, Vitamin B, and multivitamin.)  If you are being admitted to the hospital overnight, leave your suitcase in the car. After surgery it may be brought to your room.  If you are being discharged the day of surgery, you will not be allowed to drive home. You will need someone to drive you home and stay with you that night.   If you are taking public transportation, you will need to have a responsible adult to with you.  Please call the number above if you have any questions about these instructions.

## 2017-02-21 ENCOUNTER — Encounter
Admission: RE | Disposition: A | Discharge status: Home / Self Care | Payer: Self-pay | Source: Ambulatory Visit | Attending: Unknown Physician Specialty

## 2017-02-21 ENCOUNTER — Encounter: Payer: Self-pay | Admitting: *Deleted

## 2017-02-21 ENCOUNTER — Inpatient Hospital Stay
Admission: RE | Admit: 2017-02-21 | Discharge: 2017-02-27 | DRG: 627 | Disposition: A | Discharge status: Home / Self Care | Payer: Medicare HMO | Source: Ambulatory Visit | Attending: Unknown Physician Specialty | Admitting: Unknown Physician Specialty

## 2017-02-21 ENCOUNTER — Ambulatory Visit: Payer: Medicare HMO | Admitting: Anesthesiology

## 2017-02-21 ENCOUNTER — Other Ambulatory Visit: Payer: Self-pay

## 2017-02-21 DIAGNOSIS — C73 Malignant neoplasm of thyroid gland: Secondary | ICD-10-CM | POA: Diagnosis not present

## 2017-02-21 DIAGNOSIS — E041 Nontoxic single thyroid nodule: Secondary | ICD-10-CM | POA: Diagnosis not present

## 2017-02-21 DIAGNOSIS — T17308A Unspecified foreign body in larynx causing other injury, initial encounter: Secondary | ICD-10-CM | POA: Diagnosis not present

## 2017-02-21 DIAGNOSIS — T471X5A Adverse effect of other antacids and anti-gastric-secretion drugs, initial encounter: Secondary | ICD-10-CM | POA: Diagnosis not present

## 2017-02-21 DIAGNOSIS — E890 Postprocedural hypothyroidism: Secondary | ICD-10-CM

## 2017-02-21 DIAGNOSIS — D497 Neoplasm of unspecified behavior of endocrine glands and other parts of nervous system: Principal | ICD-10-CM | POA: Diagnosis present

## 2017-02-21 DIAGNOSIS — Z803 Family history of malignant neoplasm of breast: Secondary | ICD-10-CM | POA: Diagnosis not present

## 2017-02-21 DIAGNOSIS — Z6829 Body mass index (BMI) 29.0-29.9, adult: Secondary | ICD-10-CM

## 2017-02-21 DIAGNOSIS — Z87891 Personal history of nicotine dependence: Secondary | ICD-10-CM

## 2017-02-21 DIAGNOSIS — Z6828 Body mass index (BMI) 28.0-28.9, adult: Secondary | ICD-10-CM | POA: Diagnosis not present

## 2017-02-21 DIAGNOSIS — E785 Hyperlipidemia, unspecified: Secondary | ICD-10-CM | POA: Diagnosis present

## 2017-02-21 DIAGNOSIS — E669 Obesity, unspecified: Secondary | ICD-10-CM | POA: Diagnosis present

## 2017-02-21 HISTORY — DX: Failed or difficult intubation, initial encounter: T88.4XXA

## 2017-02-21 HISTORY — PX: THYROIDECTOMY: SHX17

## 2017-02-21 LAB — CREATININE, SERUM
Creatinine, Ser: 0.72 mg/dL (ref 0.44–1.00)
GFR calc Af Amer: >60 mL/min (ref >60)
GFR calc non Af Amer: >60 mL/min (ref >60)

## 2017-02-21 LAB — CALCIUM
CALCIUM: 8.8 mg/dL — AB (ref 8.9–10.3)
CALCIUM: 8.8 mg/dL — AB (ref 8.9–10.3)

## 2017-02-21 SURGERY — THYROIDECTOMY
Anesthesia: General

## 2017-02-21 MED ORDER — DEXAMETHASONE SODIUM PHOSPHATE 10 MG/ML IJ SOLN
INTRAMUSCULAR | Status: DC | PRN
  Administered 2017-02-21: 10 mg via INTRAVENOUS

## 2017-02-21 MED ORDER — PROPOFOL 10 MG/ML IV BOLUS
INTRAVENOUS | Status: DC | PRN
  Administered 2017-02-21: 130 mg via INTRAVENOUS

## 2017-02-21 MED ORDER — PHENYLEPHRINE HCL 10 MG/ML IJ SOLN
INTRAMUSCULAR | Status: DC | PRN
  Administered 2017-02-21: 200 ug via INTRAVENOUS

## 2017-02-21 MED ORDER — ACETAMINOPHEN 160 MG/5ML PO SOLN
650.0000 mg | ORAL | Status: DC | PRN
  Filled 2017-02-21: qty 20.3

## 2017-02-21 MED ORDER — ONDANSETRON HCL 4 MG PO TABS: 4.0000 mg | ORAL_TABLET | ORAL | Status: DC | PRN

## 2017-02-21 MED ORDER — FAMOTIDINE 20 MG PO TABS
20.0000 mg | ORAL_TABLET | Freq: Once | ORAL | Status: AC
  Administered 2017-02-21: 20 mg via ORAL

## 2017-02-21 MED ORDER — PROPOFOL 10 MG/ML IV BOLUS
INTRAVENOUS | Status: AC
  Filled 2017-02-21: qty 20

## 2017-02-21 MED ORDER — OXYCODONE HCL 5 MG PO TABS: 5.0000 mg | ORAL_TABLET | Freq: Once | ORAL | Status: DC | PRN

## 2017-02-21 MED ORDER — FENTANYL CITRATE (PF) 100 MCG/2ML IJ SOLN
INTRAMUSCULAR | Status: AC
  Filled 2017-02-21: qty 2

## 2017-02-21 MED ORDER — MIDAZOLAM HCL 2 MG/2ML IJ SOLN
INTRAMUSCULAR | Status: AC
  Filled 2017-02-21: qty 2

## 2017-02-21 MED ORDER — PROPOFOL 10 MG/ML IV BOLUS
INTRAVENOUS | Status: AC
  Filled 2017-02-21: qty 40

## 2017-02-21 MED ORDER — REMIFENTANIL HCL 1 MG IV SOLR
INTRAVENOUS | Status: DC | PRN
  Administered 2017-02-21: .2 ug/kg/min via INTRAVENOUS

## 2017-02-21 MED ORDER — LIDOCAINE-EPINEPHRINE 1 %-1:100000 IJ SOLN
INTRAMUSCULAR | Status: AC
  Filled 2017-02-21: qty 1

## 2017-02-21 MED ORDER — MORPHINE SULFATE (PF) 2 MG/ML IV SOLN
2.0000 mg | INTRAVENOUS | Status: DC | PRN
  Administered 2017-02-21: 2 mg via INTRAVENOUS
  Filled 2017-02-21: qty 1

## 2017-02-21 MED ORDER — EPHEDRINE SULFATE 50 MG/ML IJ SOLN
INTRAMUSCULAR | Status: AC
  Filled 2017-02-21: qty 1

## 2017-02-21 MED ORDER — SUCCINYLCHOLINE CHLORIDE 20 MG/ML IJ SOLN
INTRAMUSCULAR | Status: DC | PRN
  Administered 2017-02-21: 100 mg via INTRAVENOUS

## 2017-02-21 MED ORDER — SUCCINYLCHOLINE CHLORIDE 20 MG/ML IJ SOLN
INTRAMUSCULAR | Status: AC
  Filled 2017-02-21: qty 1

## 2017-02-21 MED ORDER — ONDANSETRON HCL 4 MG/2ML IJ SOLN
4.0000 mg | INTRAMUSCULAR | Status: DC | PRN
  Administered 2017-02-22: 4 mg via INTRAVENOUS
  Filled 2017-02-21: qty 2

## 2017-02-21 MED ORDER — PHENYLEPHRINE HCL 10 MG/ML IJ SOLN
INTRAMUSCULAR | Status: AC
  Filled 2017-02-21: qty 1

## 2017-02-21 MED ORDER — FENTANYL CITRATE (PF) 100 MCG/2ML IJ SOLN: 25.0000 ug | INTRAMUSCULAR | Status: DC | PRN

## 2017-02-21 MED ORDER — SUGAMMADEX SODIUM 500 MG/5ML IV SOLN
INTRAVENOUS | Status: AC
  Filled 2017-02-21: qty 5

## 2017-02-21 MED ORDER — ONDANSETRON HCL 4 MG/2ML IJ SOLN
INTRAMUSCULAR | Status: DC | PRN
  Administered 2017-02-21: 4 mg via INTRAVENOUS

## 2017-02-21 MED ORDER — SODIUM CHLORIDE 0.9 % IV SOLN
INTRAVENOUS | Status: DC | PRN
  Administered 2017-02-21: 75 ug/min via INTRAVENOUS

## 2017-02-21 MED ORDER — LACTATED RINGERS IV SOLN
INTRAVENOUS | Status: DC
  Administered 2017-02-21: 07:00:00 via INTRAVENOUS

## 2017-02-21 MED ORDER — BACITRACIN ZINC 500 UNIT/GM EX OINT
1.0000 "application " | TOPICAL_OINTMENT | Freq: Three times a day (TID) | CUTANEOUS | Status: DC
  Administered 2017-02-21 – 2017-02-27 (×19): 1 via TOPICAL
  Filled 2017-02-21: qty 28.35

## 2017-02-21 MED ORDER — SODIUM CHLORIDE 0.9 % IV SOLN
3.0000 g | Freq: Once | INTRAVENOUS | Status: DC
  Filled 2017-02-21: qty 3

## 2017-02-21 MED ORDER — ACETAMINOPHEN 650 MG RE SUPP: 650.0000 mg | RECTAL | Status: DC | PRN

## 2017-02-21 MED ORDER — FAMOTIDINE 20 MG PO TABS
ORAL_TABLET | ORAL | Status: AC
  Administered 2017-02-21: 20 mg via ORAL
  Filled 2017-02-21: qty 1

## 2017-02-21 MED ORDER — EPHEDRINE SULFATE 50 MG/ML IJ SOLN
INTRAMUSCULAR | Status: DC | PRN
  Administered 2017-02-21: 10 mg via INTRAVENOUS
  Administered 2017-02-21: 15 mg via INTRAVENOUS

## 2017-02-21 MED ORDER — LIDOCAINE HCL (CARDIAC) 20 MG/ML IV SOLN
INTRAVENOUS | Status: DC | PRN
  Administered 2017-02-21: 100 mg via INTRAVENOUS

## 2017-02-21 MED ORDER — OXYCODONE HCL 5 MG/5ML PO SOLN: 5.0000 mg | Freq: Once | ORAL | Status: DC | PRN

## 2017-02-21 MED ORDER — KETOROLAC TROMETHAMINE 15 MG/ML IJ SOLN
15.0000 mg | Freq: Four times a day (QID) | INTRAMUSCULAR | Status: AC | PRN
  Administered 2017-02-22 – 2017-02-25 (×3): 15 mg via INTRAVENOUS
  Filled 2017-02-21 (×3): qty 1

## 2017-02-21 MED ORDER — REMIFENTANIL HCL 1 MG IV SOLR
INTRAVENOUS | Status: AC
Start: 2017-02-21 — End: 2017-02-21
  Filled 2017-02-21: qty 1000

## 2017-02-21 MED ORDER — LIDOCAINE HCL (PF) 2 % IJ SOLN
INTRAMUSCULAR | Status: AC
  Filled 2017-02-21: qty 10

## 2017-02-21 MED ORDER — LIDOCAINE-EPINEPHRINE 1 %-1:100000 IJ SOLN
INTRAMUSCULAR | Status: DC | PRN
  Administered 2017-02-21: 4 mL

## 2017-02-21 MED ORDER — DEXTROSE-NACL 5-0.45 % IV SOLN
INTRAVENOUS | Status: DC
  Administered 2017-02-21 – 2017-02-27 (×11): via INTRAVENOUS

## 2017-02-21 MED ORDER — MIDAZOLAM HCL 2 MG/2ML IJ SOLN
INTRAMUSCULAR | Status: DC | PRN
  Administered 2017-02-21: 2 mg via INTRAVENOUS

## 2017-02-21 MED ORDER — SODIUM CHLORIDE 0.9 % IV SOLN
3.0000 g | Freq: Four times a day (QID) | INTRAVENOUS | Status: DC
  Administered 2017-02-21 – 2017-02-27 (×23): 3 g via INTRAVENOUS
  Filled 2017-02-21 (×28): qty 3

## 2017-02-21 MED ORDER — ONDANSETRON HCL 4 MG/2ML IJ SOLN
INTRAMUSCULAR | Status: AC
  Filled 2017-02-21: qty 2

## 2017-02-21 MED ORDER — REMIFENTANIL HCL 1 MG IV SOLR
INTRAVENOUS | Status: AC
  Filled 2017-02-21: qty 1000

## 2017-02-21 MED ORDER — FENTANYL CITRATE (PF) 100 MCG/2ML IJ SOLN
INTRAMUSCULAR | Status: DC | PRN
  Administered 2017-02-21 (×2): 50 ug via INTRAVENOUS

## 2017-02-21 MED ORDER — AMPICILLIN-SULBACTAM SODIUM 3 (2-1) G IJ SOLR
INTRAMUSCULAR | Status: DC | PRN
  Administered 2017-02-21: 3 g via INTRAVENOUS

## 2017-02-21 SURGICAL SUPPLY — 37 items
BLADE SURG 15 STRL LF DISP TIS (BLADE) ×1 IMPLANT
BLADE SURG 15 STRL SS (BLADE) ×2
CANISTER SUCT 1200ML W/VALVE (MISCELLANEOUS) ×3 IMPLANT
CORD BIP STRL DISP 12FT (MISCELLANEOUS) ×3 IMPLANT
DERMABOND ADVANCED (GAUZE/BANDAGES/DRESSINGS) ×2
DERMABOND ADVANCED .7 DNX12 (GAUZE/BANDAGES/DRESSINGS) ×1 IMPLANT
DRAIN TLS ROUND 10FR (DRAIN) IMPLANT
DRAPE MAG INST 16X20 L/F (DRAPES) ×3 IMPLANT
DRSG TEGADERM 2-3/8X2-3/4 SM (GAUZE/BANDAGES/DRESSINGS) ×3 IMPLANT
ELECT LARYNGEAL 6/7 (MISCELLANEOUS) ×3
ELECT LARYNGEAL 8/9 (MISCELLANEOUS)
ELECT REM PT RETURN 9FT ADLT (ELECTROSURGICAL) ×3
ELECTRODE LARYNGEAL 6/7 (MISCELLANEOUS) ×1 IMPLANT
ELECTRODE LARYNGEAL 8/9 (MISCELLANEOUS) IMPLANT
ELECTRODE REM PT RTRN 9FT ADLT (ELECTROSURGICAL) ×1 IMPLANT
FORCEPS JEWEL BIP 4-3/4 STR (INSTRUMENTS) ×3 IMPLANT
GLOVE BIO SURGEON STRL SZ7.5 (GLOVE) ×6 IMPLANT
GOWN STRL REUS W/ TWL LRG LVL3 (GOWN DISPOSABLE) ×3 IMPLANT
GOWN STRL REUS W/TWL LRG LVL3 (GOWN DISPOSABLE) ×6
HEMOSTAT SURGICEL 2X3 (HEMOSTASIS) ×3 IMPLANT
HOOK STAY 5M SHARP BLUNT 3316- (MISCELLANEOUS) IMPLANT
JACKSON PRATT 7MM (INSTRUMENTS) ×6 IMPLANT
KIT RM TURNOVER STRD PROC AR (KITS) ×3 IMPLANT
LABEL OR SOLS (LABEL) IMPLANT
NS IRRIG 500ML POUR BTL (IV SOLUTION) ×3 IMPLANT
PACK HEAD/NECK (MISCELLANEOUS) ×3 IMPLANT
PROBE NEUROSIGN BIPOL (MISCELLANEOUS) ×1 IMPLANT
PROBE NEUROSIGN BIPOLAR (MISCELLANEOUS) ×2
SHEARS HARMONIC 9CM CVD (BLADE) ×3 IMPLANT
SPONGE KITTNER 5P (MISCELLANEOUS) ×3 IMPLANT
SPONGE XRAY 4X4 16PLY STRL (MISCELLANEOUS) ×9 IMPLANT
STAPLER SKIN PROX 35W (STAPLE) ×3 IMPLANT
SUT SILK 2 0 (SUTURE) ×2
SUT SILK 2 0 SH (SUTURE) ×3 IMPLANT
SUT SILK 2-0 18XBRD TIE 12 (SUTURE) ×1 IMPLANT
SUT VIC AB 4-0 RB1 18 (SUTURE) ×12 IMPLANT
SYSTEM CHEST DRAIN TLS 7FR (DRAIN) IMPLANT

## 2017-02-21 NOTE — Anesthesia Procedure Notes (Signed)
Procedure Name: Intubation Date/Time: 02/21/2017 7:20 AM Performed by: Silvana Newness, CRNA Pre-anesthesia Checklist: Patient identified, Emergency Drugs available, Suction available, Patient being monitored and Timeout performed Patient Re-evaluated:Patient Re-evaluated prior to induction Oxygen Delivery Method: Circle system utilized Preoxygenation: Pre-oxygenation with 100% oxygen Induction Type: IV induction Ventilation: Mask ventilation without difficulty Laryngoscope Size: McGraph and 3 Grade View: Grade II Tube type: Oral (nerve monitor placed on tube) Tube size: 7.0 mm Number of attempts: 1 Airway Equipment and Method: Video-laryngoscopy and Rigid stylet Placement Confirmation: ETT inserted through vocal cords under direct vision,  positive ETCO2 and breath sounds checked- equal and bilateral Secured at: 20 cm Tube secured with: Tape Dental Injury: Teeth and Oropharynx as per pre-operative assessment  Comments: Mcgraph used to see visualization of nerve monitor at vocal cords.  Surgeon present during placement, agreed with placement.

## 2017-02-21 NOTE — H&P (Signed)
The patient's history has been reviewed, patient examined, no change in status, stable for surgery.  Questions were answered to the patients satisfaction.  

## 2017-02-21 NOTE — Op Note (Signed)
02/21/2017  10:22 AM    Tanya Frey  [id number]   Pre-Op Dx: thyroid nodule  Post-op Dx: SAME  Proc: #1 total thyroidectomy #2 recurrent laryngeal nerve monitoring 2 hours   Surg:  [name]   Asst.: Juengel  Anes:  GOT  EBL:  30 cc  Comp:  None  Findings:  Large firm mass involving the entire right lobe of the thyroid with erosion of the lateral aspect of the lamina of the thyroid cartilage on the upper right posterior side adherence of the tumor to the mucosa of the right piriform sinus region.  Procedure: [name] was identified in the holding area taken operating placed in supine position. She was then intubated with a endotracheal tube equipped with a laryngeal nerve monitoring device. With The patient safely intubated a shoulder roll was placed.  The neck was palpated was a large mass involving the right lobe of the thyroid. An incision line was marked over a natural skin crease. A local anesthetic 1% lidocaine with 1 100,000 epinephrine was used to inject along the incision line a total of 5 cc was used. The neck was then prepped and draped sterilely. A 15 blade was used to divide down to and through the platysma muscle. Hemostasis was achieved using the Bovie cautery. The strap muscles were identified in the midline and gently divided using the Harmonic scalpel. Beginning on the right-hand side the thyroid gland was identified with a large firm mass. the muscular layer was adherent and stuck to the gland in the lateral aspect particularly as it ascended superiorly. There were several small feeding vessels which were divided using the Harmonic scalpel. The mass extended superiorly up to the superior pole were this region wrapped around the posterior aspect of the thyroid cartilage.  Because the tumor was so large and we were having difficulty with lateral access the strap muscles were divided using the Harmonic scalpel. The tumor was stuck to the cartilage and actually  had eroded a portion the lateral table of the thyroid cartilage posteriorly and superiorly on the right. The tumor was also adherent to the mucosa of the right piriform sinus region. As the dissection proceeded careful dissection was used to attempt to separate the plane of the tumor from the mucosa. During this dissection approximately 1.5 cm opening was created in the mucosa of the piriform sinus. The tumor was then peeled inferiorly giving access to this region. A 3 layer closure was then used to seal this opening into the piriform sinus, 4-0 Vicryl was used as an inverting stitch in interrupted layers. the mucosa was closed primarily in this fashion. The surrounding muscle was then used to invert and give a second layer of closure over the opening. This was  followed by a third vertical level closure of the surrounding soft tissue. Careful inspection of the  area showed no evidence of connection into pyriform sinus region. As the tumor was dissected inferiorly the recurrent laryngeal nerve was identified in the tracheoesophageal groove this was stimulated and remained intact throughout the procedure. There was significant adherence of the tumor to the surrounding soft tissue. There were multiple feeding vessels which were divided using the Harmonic scalpel. The superior parathyroid gland could not be identified due to the extensive tumor burden however the inferior parathyroid gland was identified and preserved on its neurovascular pedicle. As the gland was peeled off the anterior tracheal wall there  was significant adherence to the cartilage but there did not appear to be  any invasion of the tracheal wall. With the right side dissected attention then turned to the left lobe. There was no nodularity noted in this lobe. The strap muscles retracted laterally and the superior pole vessels were isolated and divided using Harmonic scalpel. There were multiple feeding vessels which were divided using the Harmonic  scalpel. The gland was then retracted medially the recurrent laryngeal nerve was identified in the tracheoesophageal groove was stimulated and preserved throughout the case.  The superior and inferior parathyroid glands were identified on the left and gently dissected away from the gland and remained on her vascular pedicles. It was then freed inferiorly and retracted over the trachea berry's ligament was released allowing the gland to be removed in its entirety. A stitch was placed in the left upper lobe for marking. The Wound was then copiously irrigated with saline any small bleeding areas were cauterized using the microbipolar. The right superior portion was reinspected and did not appear to be any other areas of opening into the piriform sinus mucosa. Surgicel was then placed in both neurovascular beds 2 #7 Jackson-Pratt drains were brought out of the wound inferiorly. The strap muscle which had been divided on the right-hand side was then reapproximated using 4-0 Vicryl the midline raphae was closed using 4-0 Vicryl the platysma layer was closed using 4-0 Vicryl subcutaneous tissue was closed with 4-0 Vicryl and the skin was closed with Dermabond. 2-0 silk sutures were used for drain stitches.  Prior to closure both recurrent laryngeal nerves were stimulated and were neurologically intact. The patient was in return anesthesia where she was awakened in the operating room taken recovery room in stable condition.  Cultures: None  Specimen: Total thyroid  Dispo:   Good  Plan:  Admit for overnight observation  [name]  02/21/2017 10:22 AM

## 2017-02-21 NOTE — Anesthesia Preprocedure Evaluation (Signed)
Anesthesia Evaluation  Patient identified by MRN, date of birth, ID band Patient awake    Reviewed: Allergy & Precautions, H&P , NPO status , Patient's Chart, lab work & pertinent test results  History of Anesthesia Complications Negative for: history of anesthetic complications  Airway Mallampati: III  TM Distance: <3 FB Neck ROM: limited    Dental  (+) Chipped, Poor Dentition   Pulmonary neg shortness of breath, former smoker,           Cardiovascular Exercise Tolerance: Good (-) angina(-) Past MI and (-) DOE negative cardio ROS       Neuro/Psych negative neurological ROS  negative psych ROS   GI/Hepatic negative GI ROS, Neg liver ROS, neg GERD  ,  Endo/Other  negative endocrine ROS  Renal/GU      Musculoskeletal   Abdominal   Peds  Hematology negative hematology ROS (+)   Anesthesia Other Findings Past Medical History: No date: Anemia No date: Diffuse cystic mastopathy No date: Family history of malignant neoplasm of breast No date: Hyperlipidemia No date: Obesity, unspecified No date: Personal history of tobacco use, presenting hazards to health No date: Varicose veins  Past Surgical History: 1989: BREAST EXCISIONAL BIOPSY; Bilateral     Comment:  neg 2011: COLONOSCOPY     Comment:  Dr. [name]; colon polyps (tubular adenoma) 10/10/2014: COLONOSCOPY WITH PROPOFOL; N/A     Comment:  Procedure: COLONOSCOPY WITH PROPOFOL;  Surgeon: [name], MD;  Location: [facility] ENDOSCOPY;  Service:               Endoscopy;  Laterality: N/A; No date: TUBAL LIGATION No date: VARICOSE VEIN SURGERY 2009: vein closure procedure; Right     Reproductive/Obstetrics negative OB ROS                             Anesthesia Physical Anesthesia Plan  ASA: III  Anesthesia Plan: General ETT   Post-op Pain Management:    Induction: Intravenous  PONV Risk Score and Plan: 4 or  greater and Ondansetron, Midazolam and Dexamethasone  Airway Management Planned: Oral ETT and Video Laryngoscope Planned  Additional Equipment:   Intra-op Plan:   Post-operative Plan: Extubation in OR  Informed Consent: I have reviewed the patients History and Physical, chart, labs and discussed the procedure including the risks, benefits and alternatives for the proposed anesthesia with the patient or authorized representative who has indicated his/her understanding and acceptance.   Dental Advisory Given  Plan Discussed with: Anesthesiologist, CRNA and Surgeon  Anesthesia Plan Comments: (Patient consented for risks of anesthesia including but not limited to:  - adverse reactions to medications - damage to teeth, lips or other oral mucosa - sore throat or hoarseness - Damage to heart, brain, lungs or loss of life  Patient voiced understanding.)        Anesthesia Quick Evaluation

## 2017-02-21 NOTE — Transfer of Care (Signed)
Immediate Anesthesia Transfer of Care Note  Patient: Kris Mouton  Procedure(s) Performed: THYROIDECTOMY (N/A )  Patient Location: PACU  Anesthesia Type:General  Level of Consciousness: drowsy and patient cooperative  Airway & Oxygen Therapy: Patient Spontanous Breathing and Patient connected to face mask oxygen  Post-op Assessment: Report given to RN, Post -op Vital signs reviewed and stable and Patient moving all extremities X 4  Post vital signs: Reviewed and stable  Last Vitals:  Vitals:   02/21/17 0614 02/21/17 1012  BP: (!) 152/75 (P) 95/82  Pulse: 69   Resp: 20   Temp: 37.1 C (!) (P) 36.4 C  SpO2: 100%     Last Pain:  Vitals:   02/21/17 0614  TempSrc: Tympanic         Complications: No apparent anesthesia complications

## 2017-02-21 NOTE — Progress Notes (Signed)
02/21/2017 5:28 PM  Tanya Frey [id number]  Post-Op Day 0    Temp:  [97.3 F (36.3 C)-98.7 F (37.1 C)] 97.3 F (36.3 C) (01/15 1255) Pulse Rate:  [69-109] 109 (01/15 1255) Resp:  [16-26] 16 (01/15 1255) BP: (95-152)/(64-82) 133/74 (01/15 1255) SpO2:  [91 %-100 %] 96 % (01/15 1255) Weight:  [79.7 kg (175 lb 11.2 oz)] 79.7 kg (175 lb 11.2 oz) (01/15 1529),     Intake/Output Summary (Last 24 hours) at 02/21/2017 1728 Last data filed at 02/21/2017 1611 Gross per 24 hour  Intake 928 ml  Output 390 ml  Net 538 ml    Results for orders placed or performed during the hospital encounter of 02/21/17 (from the past 24 hour(s))  Calcium     Status: Abnormal   Collection Time: 02/21/17 11:19 AM  Result Value Ref Range   Calcium 8.8 (L) 8.9 - 10.3 mg/dL  Calcium     Status: Abnormal   Collection Time: 02/21/17  3:59 PM  Result Value Ref Range   Calcium 8.8 (L) 8.9 - 10.3 mg/dL    SUBJECTIVE:  Sore throat but otherwise ok.  Pain controlled  OBJECTIVE:  Neck looks great, no hematoma, drains holding bilaterally with no air leak.  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  No evidence of leak.  Will follow drains closely and maintain NPO status.  Calcium stable at 8.8.    PLAN:  Continue NPO, antibiotics.  Will likely get Ba swallow post-op day 2 or 3 to be sure no leak prior to allowing any by mouth.  Check calcium in am.    [name] 02/21/2017, 5:28 PM

## 2017-02-21 NOTE — Anesthesia Postprocedure Evaluation (Signed)
Anesthesia Post Note  Patient: Tanya Frey  Procedure(s) Performed: THYROIDECTOMY (N/A )  Patient location during evaluation: PACU Anesthesia Type: General Level of consciousness: awake and alert Pain management: pain level controlled Vital Signs Assessment: post-procedure vital signs reviewed and stable Respiratory status: spontaneous breathing, nonlabored ventilation, respiratory function stable and patient connected to nasal cannula oxygen Cardiovascular status: blood pressure returned to baseline and stable Postop Assessment: no apparent nausea or vomiting Anesthetic complications: no     Last Vitals:  Vitals:   02/21/17 1109 02/21/17 1145  BP: 137/73 124/64  Pulse: (!) 105 (!) 106  Resp: 18 16  Temp: 36.5 C 36.7 C  SpO2: 95% 95%    Last Pain:  Vitals:   02/21/17 1145  TempSrc: Oral  PainSc:                  [name]

## 2017-02-21 NOTE — Anesthesia Post-op Follow-up Note (Signed)
Anesthesia QCDR form completed.        

## 2017-02-22 ENCOUNTER — Encounter: Payer: Self-pay | Admitting: Unknown Physician Specialty

## 2017-02-22 DIAGNOSIS — T471X5A Adverse effect of other antacids and anti-gastric-secretion drugs, initial encounter: Secondary | ICD-10-CM | POA: Diagnosis not present

## 2017-02-22 DIAGNOSIS — Z803 Family history of malignant neoplasm of breast: Secondary | ICD-10-CM | POA: Diagnosis not present

## 2017-02-22 DIAGNOSIS — E785 Hyperlipidemia, unspecified: Secondary | ICD-10-CM | POA: Diagnosis present

## 2017-02-22 DIAGNOSIS — Z87891 Personal history of nicotine dependence: Secondary | ICD-10-CM | POA: Diagnosis not present

## 2017-02-22 DIAGNOSIS — Z6829 Body mass index (BMI) 29.0-29.9, adult: Secondary | ICD-10-CM | POA: Diagnosis not present

## 2017-02-22 DIAGNOSIS — E669 Obesity, unspecified: Secondary | ICD-10-CM | POA: Diagnosis present

## 2017-02-22 DIAGNOSIS — D497 Neoplasm of unspecified behavior of endocrine glands and other parts of nervous system: Secondary | ICD-10-CM | POA: Diagnosis present

## 2017-02-22 LAB — SURGICAL PATHOLOGY

## 2017-02-22 LAB — CALCIUM
Calcium: 8.1 mg/dL — ABNORMAL LOW (ref 8.9–10.3)
Calcium: 8.1 mg/dL — ABNORMAL LOW (ref 8.9–10.3)

## 2017-02-22 MED ORDER — LORAZEPAM 2 MG/ML IJ SOLN
2.0000 mg | Freq: Four times a day (QID) | INTRAMUSCULAR | Status: DC | PRN
  Administered 2017-02-22 – 2017-02-23 (×2): 2 mg via INTRAVENOUS
  Filled 2017-02-22 (×3): qty 1

## 2017-02-22 MED ORDER — BENZOCAINE 20 % MT AERO
INHALATION_SPRAY | Freq: Four times a day (QID) | OROMUCOSAL | Status: DC | PRN
  Filled 2017-02-22: qty 57

## 2017-02-22 MED ORDER — BENZOCAINE 20 % MT SOLN
1.0000 "application " | Freq: Four times a day (QID) | OROMUCOSAL | Status: DC | PRN
  Filled 2017-02-22: qty 5

## 2017-02-22 NOTE — Progress Notes (Signed)
02/22/2017 7:56 AM  Tanya Frey [id number]  Post-Op Day 1    Temp:  [97.3 F (36.3 C)-98.2 F (36.8 C)] 98.2 F (36.8 C) (01/16 0358) Pulse Rate:  [103-111] 103 (01/16 0358) Resp:  [16-26] 18 (01/16 0358) BP: (95-137)/(63-82) 125/63 (01/16 0358) SpO2:  [91 %-97 %] 97 % (01/16 0358) Weight:  [79.7 kg (175 lb 11.2 oz)] 79.7 kg (175 lb 11.2 oz) (01/15 1529),     Intake/Output Summary (Last 24 hours) at 02/22/2017 0756 Last data filed at 02/22/2017 0401 Gross per 24 hour  Intake 1983 ml  Output 1805 ml  Net 178 ml    Results for orders placed or performed during the hospital encounter of 02/21/17 (from the past 24 hour(s))  Calcium     Status: Abnormal   Collection Time: 02/21/17 11:19 AM  Result Value Ref Range   Calcium 8.8 (L) 8.9 - 10.3 mg/dL  Calcium     Status: Abnormal   Collection Time: 02/21/17  3:59 PM  Result Value Ref Range   Calcium 8.8 (L) 8.9 - 10.3 mg/dL  Creatinine, serum     Status: None   Collection Time: 02/21/17  3:59 PM  Result Value Ref Range   Creatinine, Ser 0.72 0.44 - 1.00 mg/dL   GFR calc non Af Amer >60 >60 mL/min   GFR calc Af Amer >60 >60 mL/min  Calcium     Status: Abnormal   Collection Time: 02/22/17  4:48 AM  Result Value Ref Range   Calcium 8.1 (L) 8.9 - 10.3 mg/dL    SUBJECTIVE:  Feeling better, mild cough, throat pain improving  OBJECTIVE:  Chest clear to auscultation, Neck no hematoma, drains holding, no leak apparent, Ca 8.1 this am  IMPRESSION:  S/p total thyroidectomy, drains holding, no leak apparent.  Ca slightly down at 8.1   PLAN:  Will check Ca this pm, she is aware of symptoms of hypocalcemia and will let nursing know if any symptoms arise.  Plan Ba swallow either tomorrow or Friday to rule out leak.  Possilbly DC drain to left neck later today.  Continue Unasyn until drains removed.  Ambulate today.    [name] 02/22/2017, 7:56 AM

## 2017-02-23 LAB — CALCIUM
Calcium: 7.9 mg/dL — ABNORMAL LOW (ref 8.9–10.3)
Calcium: 7.6 mg/dL — ABNORMAL LOW (ref 8.9–10.3)

## 2017-02-23 LAB — ALBUMIN: Albumin: 3.7 g/dL (ref 3.5–5.0)

## 2017-02-23 MED ORDER — GLYCOPYRROLATE 0.2 MG/ML IJ SOLN
0.1000 mg | INTRAMUSCULAR | Status: DC
  Administered 2017-02-23 – 2017-02-24 (×7): 0.1 mg via INTRAVENOUS
  Administered 2017-02-25: via INTRAVENOUS
  Administered 2017-02-25 – 2017-02-27 (×15): 0.1 mg via INTRAVENOUS
  Filled 2017-02-23 (×29): qty 0.5

## 2017-02-23 MED ORDER — DEXAMETHASONE SODIUM PHOSPHATE 10 MG/ML IJ SOLN
10.0000 mg | Freq: Once | INTRAMUSCULAR | Status: AC
  Administered 2017-02-23: 10 mg via INTRAVENOUS
  Filled 2017-02-23 (×2): qty 1

## 2017-02-23 NOTE — Progress Notes (Signed)
02/23/2017 8:02 AM  [name] [id number]  Post-Op Day 2    Temp:  [97.7 F (36.5 C)-98.8 F (37.1 C)] 98.1 F (36.7 C) (01/17 0437) Pulse Rate:  [84-103] 103 (01/17 0437) Resp:  [17-20] 18 (01/17 0437) BP: (106-131)/(53-81) 106/81 (01/17 0437) SpO2:  [94 %-99 %] 94 % (01/17 0437),     Intake/Output Summary (Last 24 hours) at 02/23/2017 0802 Last data filed at 02/23/2017 0416 Gross per 24 hour  Intake 2212.9 ml  Output 850 ml  Net 1362.9 ml    Results for orders placed or performed during the hospital encounter of 02/21/17 (from the past 24 hour(s))  Calcium     Status: Abnormal   Collection Time: 02/22/17  4:03 PM  Result Value Ref Range   Calcium 8.1 (L) 8.9 - 10.3 mg/dL  Calcium     Status: Abnormal   Collection Time: 02/23/17  5:00 AM  Result Value Ref Range   Calcium 7.9 (L) 8.9 - 10.3 mg/dL    SUBJECTIVE:  Feeling better this am.  Slept better with ativan last night.  Some coughing but pain controlled. Both Drains 35 total for the shift, serosanguinous only.    OBJECTIVE:  Lungs-CTA, Neck without swelling or hematoma.  Drains only serosanguinous drainage.    IMPRESSION:  S/p total thyroidectomy, pain controlled, Ca at 7.9 this am.  Debating on when to do swallow study, either at postop day 3 or 5.  Will see how she does today.  No evidence of leak with drains, bulbs were holding and only small amount of sersanguinous drainage.  I removed both drains today.  We will see how she progresses over the next 24 hours.   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  Will discuss at tumor conference today.    PLAN:  Follow Calcium Continue NPO, still debating on when to do leak study.  If she can tolerate not eating until Sunday, may wait until then.    [name] 02/23/2017, 8:02 AM

## 2017-02-23 NOTE — Progress Notes (Signed)
02/23/2017 5:37 PM  Tanya Frey [id number]  Post-Op Day 2    Temp:  [97.7 F (36.5 C)-98.3 F (36.8 C)] 98.3 F (36.8 C) (01/17 1424) Pulse Rate:  [83-103] 88 (01/17 1424) Resp:  [18-20] 18 (01/17 1424) BP: (106-131)/(57-81) 123/69 (01/17 1424) SpO2:  [94 %-99 %] 95 % (01/17 1424),     Intake/Output Summary (Last 24 hours) at 02/23/2017 1737 Last data filed at 02/23/2017 0416 Gross per 24 hour  Intake 1038 ml  Output 535 ml  Net 503 ml    Results for orders placed or performed during the hospital encounter of 02/21/17 (from the past 24 hour(s))  Calcium     Status: Abnormal   Collection Time: 02/23/17  5:00 AM  Result Value Ref Range   Calcium 7.9 (L) 8.9 - 10.3 mg/dL  Calcium     Status: Abnormal   Collection Time: 02/23/17  4:35 PM  Result Value Ref Range   Calcium 7.6 (L) 8.9 - 10.3 mg/dL    SUBJECTIVE:  Upright in bed, only real complaint constant feeling like she needs to clear her throat.  Not hungry.  No perioral tingling or in hands or feet.    OBJECTIVE:  Neck with symmetric ant midline soft tissue swelling, no abscess or fluid collection noted.  No significant drainage from drain ports from drains removed this am.     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  Plan will be for Radioactive iodine once healed.  Will try some gycopyrolate for excess saliva as that is what she thinks is causing the cough.  I'll also give a dose of decadron for possible post-op swelling, but I don't want to keep her on that as I want the possible fistula tract to heal.  Certainly possible that the right cord is weak from the surgery and tumor which could cause some aspiration, but she is NPO anyway.  She is asymptomatic at this point from hypocalcemia.  Will send for stat albumin to see if corrects to normal. Pharmacy is calculating a dose for  Calcium gluconate if she should need it.  Had discussion with family, radiology, and head/neck surgeons at [facility].  The 5 day mark is the most conservative for a leak study, that would be Sunday, however the best flouro team would be there Monday AM, so unless things change, will proceed with leak study Monday AM. Dr. Kathyrn Sheriff will be following her in my absence tomorrow and Saturday.    Tanya Frey 02/23/2017, 5:37 PM

## 2017-02-24 LAB — CALCIUM: CALCIUM: 7.6 mg/dL — AB (ref 8.9–10.3)

## 2017-02-24 MED ORDER — PANTOPRAZOLE SODIUM 40 MG IV SOLR
40.0000 mg | INTRAVENOUS | Status: DC
  Filled 2017-02-24: qty 40

## 2017-02-24 MED ORDER — PANTOPRAZOLE SODIUM 40 MG IV SOLR
40.0000 mg | Freq: Once | INTRAVENOUS | Status: AC
  Administered 2017-02-24: 40 mg via INTRAVENOUS
  Filled 2017-02-24: qty 40

## 2017-02-24 MED ORDER — LORAZEPAM 2 MG/ML IJ SOLN
0.5000 mg | Freq: Four times a day (QID) | INTRAMUSCULAR | Status: DC | PRN
  Administered 2017-02-24: 0.5 mg via INTRAVENOUS
  Filled 2017-02-24: qty 1

## 2017-02-24 NOTE — Progress Notes (Signed)
Denies s/s of hypercalcemia overnight; slept well after Ativan adm; up in hall with sister this am; Barbaraann Faster, RN 7:05 AM 02/24/2017

## 2017-02-24 NOTE — Progress Notes (Signed)
02/24/2017  12:30 PM  Doing rounds, covering for Dr. Tami Ribas.  S: Patient feels  a little better than yesterday.  Still feels like there is mucus in her throat but the Robinul is helped her a lot.  She did not have any unusual pain or any drainage sensation in her throat.  She does not have any tingling in her hands or legs.  She is still n.p.o. she has felt a little bit of acid in her throat a couple of times.  O: Afebrile and vital signs stable.  Her neck wound still shows some submental edema as before but she says it is not any larger than it was.  Her drains sites are dry without any signs of leakage.  There is no sign of any fluid that is developing under her flap.  Her calcium this morning was 7.8, which is slightly better than yesterday.  A: She is stable right now with no evidence of drainage or fluid collecting under the flap.  She is still n.p.o.  And will remain that way until Monday when she will have a barium swallow to make sure there is no sign of leak.  He is on IV fluids and stable.  Pain is well controlled.  She has some history of reflux and takes little acinar throat that could be adding to her drainage sensation in throat.  P: We will add IV omeprazole in the morning to help prevent reflux symptoms.  Continue supportive therapy for her and staying n.p.o.  She used the Ativan last night and worked well and she has that as needed if needed tonight.  Remain on Robinul to help control mucus in her throat.  Will evaluate again tomorrow for Dr. Tami Ribas.  Elon Alas [place] Shaler

## 2017-02-24 NOTE — Care Management Important Message (Signed)
Important Message  Patient Details  Name: Tanya Frey MRN: [id number] Date of Birth: Jan 27, 1946   Medicare Important Message Given:  Yes    Beverly Sessions, RN 02/24/2017, 5:27 PM

## 2017-02-25 MED ORDER — LORAZEPAM 2 MG/ML IJ SOLN
1.0000 mg | Freq: Every evening | INTRAMUSCULAR | Status: DC | PRN
  Administered 2017-02-25 – 2017-02-26 (×3): 1 mg via INTRAVENOUS
  Filled 2017-02-25 (×3): qty 1

## 2017-02-25 NOTE — Plan of Care (Signed)
Neck incision is slight red and edematous but per pt is improving and pain is at a minimum. Still NPO status. Ambulating in halls frequently.

## 2017-02-25 NOTE — Progress Notes (Signed)
Notified Dr. Duane Boston pt's daughter is concern about her mom getting too much of Ativan(2mg ). New orders put in at this time.

## 2017-02-25 NOTE — Progress Notes (Signed)
02/25/2017 3:30 PM  S: Patient is feeling well with no new pains.  She has not have any significant discomfort when she swallows.  She was given 1 dose of omeprazole yesterday IV and got very red and swollen after it so it was stopped as she apparently had an acute allergic reaction to it.  She is sleeping okay at night.  O: Afebrile, vital signs stable.  Neck is not any more swollen, and the pinkness in the neck and the swelling of the submental area is slowly going down.  The drains are not leaking at all and there is no fullness or signs of fluid in the neck at all.  She is still up walking in the halls on a regular basis.  A: She is doing very well and shows no evidence of fistula.  She had her IV replaced today.  She seems to be very stable.  P: Dr. Tami Ribas will be back tomorrow to pick up care again and evaluate her tomorrow.  She is scheduled for a limited barium swallow to be done Monday morning to look for leak and make sure that she is healed well.  If there is no evidence of leak she will then be able to start on some clear liquids and advance her diet from there.

## 2017-02-26 LAB — CBC
HCT: 36.9 % (ref 35.0–47.0)
Hemoglobin: 12.5 g/dL (ref 12.0–16.0)
MCH: 29.6 pg (ref 26.0–34.0)
MCHC: 33.9 g/dL (ref 32.0–36.0)
MCV: 87.2 fL (ref 80.0–100.0)
Platelets: 244 10*3/uL (ref 150–440)
RBC: 4.23 MIL/uL (ref 3.80–5.20)
RDW: 14.4 % (ref 11.5–14.5)
WBC: 4.9 10*3/uL (ref 3.6–11.0)

## 2017-02-26 LAB — BASIC METABOLIC PANEL
Anion gap: 9 (ref 5–15)
BUN: 7 mg/dL (ref 6–20)
CALCIUM: 8 mg/dL — AB (ref 8.9–10.3)
CO2: 27 mmol/L (ref 22–32)
CREATININE: 0.5 mg/dL (ref 0.44–1.00)
Chloride: 100 mmol/L — ABNORMAL LOW (ref 101–111)
GFR calc non Af Amer: >60 mL/min (ref >60)
Glucose, Bld: 105 mg/dL — ABNORMAL HIGH (ref 65–99)
Potassium: 3.2 mmol/L — ABNORMAL LOW (ref 3.5–5.1)
SODIUM: 136 mmol/L (ref 135–145)

## 2017-02-26 NOTE — Progress Notes (Signed)
02/26/2017 9:41 AM  Tanya Frey [id number]  Post-Op Day 5    Temp:  [97.5 F (36.4 C)-98.2 F (36.8 C)] 97.5 F (36.4 C) (01/20 0535) Pulse Rate:  [59-82] 82 (01/20 0535) Resp:  [18-20] 20 (01/20 0535) BP: (137-148)/(67-78) 139/78 (01/20 0535) SpO2:  [97 %-98 %] 98 % (01/20 0535),     Intake/Output Summary (Last 24 hours) at 02/26/2017 0941 Last data filed at 02/26/2017 0901 Gross per 24 hour  Intake 2002.75 ml  Output 2200 ml  Net -197.25 ml    Results for orders placed or performed during the hospital encounter of 02/21/17 (from the past 24 hour(s))  CBC     Status: None   Collection Time: 02/26/17  9:00 AM  Result Value Ref Range   WBC 4.9 3.6 - 11.0 K/uL   RBC 4.23 3.80 - 5.20 MIL/uL   Hemoglobin 12.5 12.0 - 16.0 g/dL   HCT 36.9 35.0 - 47.0 %   MCV 87.2 80.0 - 100.0 fL   MCH 29.6 26.0 - 34.0 pg   MCHC 33.9 32.0 - 36.0 g/dL   RDW 14.4 11.5 - 14.5 %   Platelets 244 150 - 440 K/uL  Basic metabolic panel     Status: Abnormal   Collection Time: 02/26/17  9:00 AM  Result Value Ref Range   Sodium 136 135 - 145 mmol/L   Potassium 3.2 (L) 3.5 - 5.1 mmol/L   Chloride 100 (L) 101 - 111 mmol/L   CO2 27 22 - 32 mmol/L   Glucose, Bld 105 (H) 65 - 99 mg/dL   BUN 7 6 - 20 mg/dL   Creatinine, Ser 0.50 0.44 - 1.00 mg/dL   Calcium 8.0 (L) 8.9 - 10.3 mg/dL   GFR calc non Af Amer >60 >60 mL/min   GFR calc Af Amer >60 >60 mL/min   Anion gap 9 5 - 15    SUBJECTIVE:  Feeling better, less throat clearing.  Pain controlled  OBJECTIVE:  Neck-swelling decreased, no evidence of fluid collection or abscess  IMPRESSION:  S/p total thyroidectomy-calcium stablilizing.  Will proceed with Ba swallow tomorrow to be sure no leak from right pyriform sinus area.  If clear will begin clear liquid diet.    PLAN:  Ba swallow in AM.  Continue to monitor Ca  [name] 02/26/2017, 9:41 AM

## 2017-02-27 ENCOUNTER — Inpatient Hospital Stay: Payer: Medicare HMO

## 2017-02-27 LAB — CALCIUM: Calcium: 7.8 mg/dL — ABNORMAL LOW (ref 8.9–10.3)

## 2017-02-27 MED ORDER — IOPAMIDOL (ISOVUE-300) INJECTION 61%
75.0000 mL | Freq: Once | INTRAVENOUS | Status: AC | PRN
  Administered 2017-02-27: 75 mL via ORAL

## 2017-02-27 NOTE — Progress Notes (Signed)
Tanya Frey to be D/C'd home per MD order.  Discussed prescriptions and follow up appointments with the patient. Prescriptions given to patient, medication list explained in detail. Pt verbalized understanding.  Allergies as of 02/27/2017      Reactions   Protonix [pantoprazole Sodium] Anaphylaxis, Other (See Comments)      Medication List    STOP taking these medications   aspirin EC 325 MG tablet   aspirin EC 81 MG tablet   calcium-vitamin D 500-200 MG-UNIT tablet Commonly known as:  OSCAL WITH D   Calcium-Vitamin D 600-200 MG-UNIT tablet   multivitamin with minerals Tabs tablet     TAKE these medications   cimetidine 200 MG tablet Commonly known as:  TAGAMET Take 200 mg by mouth 2 (two) times daily as needed (indigestion).   diphenhydrAMINE 25 MG tablet Commonly known as:  BENADRYL Take 50 mg by mouth every 4 (four) hours as needed for allergies.   simvastatin 40 MG tablet Commonly known as:  ZOCOR Take 40 mg by mouth every evening.       Vitals:   02/27/17 1321 02/27/17 1703  BP: (!) 142/75 (!) 142/84  Pulse: 81 71  Resp: 18 16  Temp: 97.8 F (36.6 C) 98 F (36.7 C)  SpO2: 96% 97%    Skin clean, dry and intact without evidence of skin break down, no evidence of skin tears noted. IV catheter discontinued intact. Site without signs and symptoms of complications. Dressing and pressure applied. Pt denies pain at this time. No complaints noted.  An After Visit Summary was printed and given to the patient. Patient escorted via [place], and D/C home via private auto.  Tanya Frey A

## 2017-02-27 NOTE — Progress Notes (Signed)
02/27/2017 8:10 AM  [name] [id number]  Post-Op Day 6    Temp:  [97.7 F (36.5 C)-98.2 F (36.8 C)] 97.7 F (36.5 C) (01/21 0531) Pulse Rate:  [62-74] 74 (01/21 0531) Resp:  [20] 20 (01/21 0531) BP: (120-140)/(60-61) 128/61 (01/21 0531) SpO2:  [95 %-97 %] 95 % (01/21 0531),     Intake/Output Summary (Last 24 hours) at 02/27/2017 0810 Last data filed at 02/27/2017 0800 Gross per 24 hour  Intake 2007.25 ml  Output 2200 ml  Net -192.75 ml    Results for orders placed or performed during the hospital encounter of 02/21/17 (from the past 24 hour(s))  CBC     Status: None   Collection Time: 02/26/17  9:00 AM  Result Value Ref Range   WBC 4.9 3.6 - 11.0 K/uL   RBC 4.23 3.80 - 5.20 MIL/uL   Hemoglobin 12.5 12.0 - 16.0 g/dL   HCT 36.9 35.0 - 47.0 %   MCV 87.2 80.0 - 100.0 fL   MCH 29.6 26.0 - 34.0 pg   MCHC 33.9 32.0 - 36.0 g/dL   RDW 14.4 11.5 - 14.5 %   Platelets 244 150 - 440 K/uL  Basic metabolic panel     Status: Abnormal   Collection Time: 02/26/17  9:00 AM  Result Value Ref Range   Sodium 136 135 - 145 mmol/L   Potassium 3.2 (L) 3.5 - 5.1 mmol/L   Chloride 100 (L) 101 - 111 mmol/L   CO2 27 22 - 32 mmol/L   Glucose, Bld 105 (H) 65 - 99 mg/dL   BUN 7 6 - 20 mg/dL   Creatinine, Ser 0.50 0.44 - 1.00 mg/dL   Calcium 8.0 (L) 8.9 - 10.3 mg/dL   GFR calc non Af Amer >60 >60 mL/min   GFR calc Af Amer >60 >60 mL/min   Anion gap 9 5 - 15  Calcium     Status: Abnormal   Collection Time: 02/27/17  4:19 AM  Result Value Ref Range   Calcium 7.8 (L) 8.9 - 10.3 mg/dL    SUBJECTIVE:  No complaints this am.    OBJECTIVE:  Neck swelling decreased, no evidence of fluid collection  IMPRESSION:  S/p total thyroidectomy for papillary ca.    PLAN:  Swallow study today to rule out leak.  If clear then begin clear liquid diet.  Will also consult dietary for low iodine diet in preparation for I131 treatment for thyroid cancer.  Calcium remains stable.    Tanya Frey 02/27/2017, 8:10 AM

## 2017-02-27 NOTE — Progress Notes (Signed)
Nutrition Education Note  RD consulted for nutrition education regarding a low iodine diet in preparation for radioactive iodine treatments.   72 y/o female with new thyroid mass s/p thyroidectomy.   RD provided handouts from the [employer] and [employer] regarding a low iodine diet. Provided examples of ways to decrease iodine intake in the diet. Discouraged intake of processed/preserved foods, restaurant foods, and the use of iodized salts. Provided nutrient list handouts with the iodine content of foods. Recommended <50 mcg of iodine per day.   RD discussed why it is important for patient to adhere to diet recommendations and the importance of adequate protein. Recommend ways for pt to add protein in their diet. Provided examples of low iodine supplements and protein powders.   Teach back method used.  Expect good compliance.  Body mass index is 29.24 kg/m. Pt meets criteria for overweight based on current BMI.  Pt advanced to clear liquids today. Pt likely to remain on liquid diet as she has been having some difficulty swallowing. Pt eating broth at time of RD visit and seemed to be tolerating this well.   No further nutrition interventions warranted at this time. RD contact information provided. If additional nutrition issues arise, please re-consult RD.   Koleen Distance MS, RD, LDN Pager #- [phone] After Hours Pager: [phone]

## 2017-02-27 NOTE — Discharge Summary (Signed)
02/27/2017 4:51 PM  Tanya Frey [id number]  Post-Op Day 6    Temp:  [97.5 F (36.4 C)-98 F (36.7 C)] 97.8 F (36.6 C) (01/21 1321) Pulse Rate:  [65-81] 81 (01/21 1321) Resp:  [16-20] 18 (01/21 1321) BP: (120-142)/(60-75) 142/75 (01/21 1321) SpO2:  [93 %-96 %] 96 % (01/21 1321),     Intake/Output Summary (Last 24 hours) at 02/27/2017 1651 Last data filed at 02/27/2017 6433 Gross per 24 hour  Intake 1276.25 ml  Output 2400 ml  Net -1123.75 ml    Results for orders placed or performed during the hospital encounter of 02/21/17 (from the past 24 hour(s))  Calcium     Status: Abnormal   Collection Time: 02/27/17  4:19 AM  Result Value Ref Range   Calcium 7.8 (L) 8.9 - 10.3 mg/dL    SUBJECTIVE:  Feeling much better this afternoon swallow study done today showed no evidence of leak she has been drinking a lot and this afternoon without difficulty. There is no neck swelling and she is in no pain.  OBJECTIVE:  Neck clean and dry no evidence of fluid collection or abscess  IMPRESSION:  Status post total thyroidectomy. I will schedule follow-up with me in 1 week will also make her appointment see Dr. Oneita Jolly day and oncology. She is to have a clear liquid diet for the next 48 hours and then advanced to a full liquid diet. Stay on this full liquid diet until I see her back next Monday.  PLAN:  Discharge to home follow-up 1 week. Clear liquid diet 48 hours and advanced to full liquid diet until I see her back. She will also maintain a low iodine diet. She is to be aware of any signs of hypocalcemia or any signs of infection in the neck.  Tanya Frey 02/27/2017, 4:51 PM

## 2017-03-07 ENCOUNTER — Encounter: Payer: Self-pay | Admitting: Internal Medicine

## 2017-03-07 ENCOUNTER — Inpatient Hospital Stay: Payer: Medicare HMO

## 2017-03-07 ENCOUNTER — Inpatient Hospital Stay: Payer: Medicare HMO | Attending: Internal Medicine | Admitting: Internal Medicine

## 2017-03-07 DIAGNOSIS — C73 Malignant neoplasm of thyroid gland: Secondary | ICD-10-CM | POA: Diagnosis not present

## 2017-03-07 DIAGNOSIS — E785 Hyperlipidemia, unspecified: Secondary | ICD-10-CM

## 2017-03-07 DIAGNOSIS — R21 Rash and other nonspecific skin eruption: Secondary | ICD-10-CM | POA: Insufficient documentation

## 2017-03-07 DIAGNOSIS — Z87891 Personal history of nicotine dependence: Secondary | ICD-10-CM | POA: Diagnosis not present

## 2017-03-07 LAB — COMPREHENSIVE METABOLIC PANEL
ALBUMIN: 4.3 g/dL (ref 3.5–5.0)
ALK PHOS: 58 U/L (ref 38–126)
ALT: 19 U/L (ref 14–54)
AST: 25 U/L (ref 15–41)
Anion gap: 10 (ref 5–15)
BUN: 7 mg/dL (ref 6–20)
CALCIUM: 8.9 mg/dL (ref 8.9–10.3)
CHLORIDE: 95 mmol/L — AB (ref 101–111)
CO2: 24 mmol/L (ref 22–32)
Creatinine, Ser: 0.72 mg/dL (ref 0.44–1.00)
GFR calc Af Amer: >60 mL/min (ref >60)
GFR calc non Af Amer: >60 mL/min (ref >60)
GLUCOSE: 147 mg/dL — AB (ref 65–99)
Potassium: 4.7 mmol/L (ref 3.5–5.1)
SODIUM: 129 mmol/L — AB (ref 135–145)
Total Bilirubin: 0.5 mg/dL (ref 0.3–1.2)
Total Protein: 7.7 g/dL (ref 6.5–8.1)

## 2017-03-07 LAB — CBC WITH DIFFERENTIAL/PLATELET
Basophils Absolute: 0.1 10*3/uL (ref 0–0.1)
Basophils Relative: 1 %
EOS ABS: 0 10*3/uL (ref 0–0.7)
EOS PCT: 0 %
HCT: 42.1 % (ref 35.0–47.0)
Hemoglobin: 14.4 g/dL (ref 12.0–16.0)
LYMPHS ABS: 1.2 10*3/uL (ref 1.0–3.6)
Lymphocytes Relative: 16 %
MCH: 29.6 pg (ref 26.0–34.0)
MCHC: 34.2 g/dL (ref 32.0–36.0)
MCV: 86.5 fL (ref 80.0–100.0)
MONO ABS: 0.2 10*3/uL (ref 0.2–0.9)
MONOS PCT: 2 %
NEUTROS PCT: 81 %
Neutro Abs: 5.8 10*3/uL (ref 1.4–6.5)
Platelets: 269 10*3/uL (ref 150–440)
RBC: 4.87 MIL/uL (ref 3.80–5.20)
RDW: 14.8 % — AB (ref 11.5–14.5)
WBC: 7.2 10*3/uL (ref 3.6–11.0)

## 2017-03-07 NOTE — Progress Notes (Signed)
[place] NOTE  Patient Care Team: [name], MD as PCP - General (Internal Medicine) [name], MD as Consulting Physician (General Surgery)  CHIEF COMPLAINTS/PURPOSE OF CONSULTATION: Thyroid cancer  #  Oncology History   # JAN 2019- PAPILLARY THYROID CA [Dr.McQueen/Sankar] ; s/p Total thyroidectomy;  RIGHT LOBE, 4.6 CM IN GREATEST DIMENSION,  WITH EXTRATHYROIDAL EXTENSION; pT4apNx; STAGE III    -----------------------------------------------------------------------------------  THYROID GLAND:  Procedure: Total thyroidectomy  Tumor Focality: Unifocal  Tumor Site: Right lobe extending into isthmus  Tumor Size: 4.6 cm in greatest dimension  Histologic Type: Papillary carcinoma, classic type  Margins: Negative for carcinoma  Angioinvasion (Vascular Invasion): Present  Lymphatic Invasion: Not identified  Extrathyroidal Extension: Present  Extent: Clinical/macroscopic invasion of thyroid cartilage,  clinical/macroscopic invasion up to the pyriform sinus mucosa, and  macroscopic and microscopic invasion of strap muscles  Regional Lymph Nodes: Lymph nodes not submitted or found  Pathologic Stage Classification (pTNM, AJCC 8th Edition): pT4a pNX     Thyroid cancer (HCC)     HISTORY OF PRESENTING ILLNESS:  Tanya Frey 72 y.o.  female patient has been referred to [place] for further evaluation/recommendations for thyroid cancer.  Patient noted to have a right-sided thyroid nodule a few months ago that was biopsied by Dr. Jamal Collin which were papillary thyroid cancer.  Patient went on to have a thyroidectomy-with Dr. Andree Elk showed 4.6 cm mass microscopic invasion of the thyroid cartilage and also invasion of the strap muscles.  However, final margins negative.  Patient had surgery exactly 2 weeks ago.  She is not on thyroid medications.  She is currently on low iodine diet; which is causing her significant inconvenience.  She has not  been started on thyroid medication yet.   Denies having any radiation to the neck.  Denies any previous thyroid malignancies.  In the interim patient was started on antibiotic/Omnicef-and has noted a rash to entire body.  Subsequently she has stopped the antibiotic.  Rash is improving.  She is on prednisone taper and also Benadryl.  ROS: A complete 10 point review of system is done which is negative except mentioned above in history of present illness  MEDICAL HISTORY:  Past Medical History:  Diagnosis Date  . Anemia   . Breast cancer ([place])    daugther  . Difficult intubation   . Diffuse cystic mastopathy   . Family history of malignant neoplasm of breast   . Hyperlipidemia   . Obesity, unspecified   . Personal history of tobacco use, presenting hazards to health   . Varicose veins     SURGICAL HISTORY: Past Surgical History:  Procedure Laterality Date  . BREAST EXCISIONAL BIOPSY Bilateral 1989   neg  . COLONOSCOPY  2011   Dr. [name]; colon polyps (tubular adenoma)  . COLONOSCOPY N/A   . COLONOSCOPY WITH PROPOFOL N/A 10/10/2014   Procedure: COLONOSCOPY WITH PROPOFOL;  Surgeon: [name], MD;  Location: [facility] ENDOSCOPY;  Service: Endoscopy;  Laterality: N/A;  . THYROID SURGERY Bilateral   . THYROIDECTOMY N/A 02/21/2017   Procedure: THYROIDECTOMY;  Surgeon: [name], MD;  Location: [facility] ORS;  Service: ENT;  Laterality: N/A;  . TUBAL LIGATION    . VARICOSE VEIN SURGERY    . vein closure procedure Right 2009    SOCIAL HISTORY: Social History   Socioeconomic History  . Marital status: Married    Spouse name: Not on file  . Number of children: Not on file  . Years of  education: Not on file  . Highest education level: Not on file  Social Needs  . Financial resource strain: Not on file  . Food insecurity - worry: Not on file  . Food insecurity - inability: Not on file  . Transportation needs - medical: Not on file  . Transportation needs - non-medical: Not  on file  Occupational History  . Not on file  Tobacco Use  . Smoking status: Former Smoker    Packs/day: 1.00    Years: 10.00    Pack years: 10.00    Last attempt to quit: 02/07/1990    Years since quitting: 27.0  . Smokeless tobacco: Never Used  Substance and Sexual Activity  . Alcohol use: Yes    Alcohol/week: 0.5 - 2.0 oz    Types: 1 - 4 Standard drinks or equivalent per week    Comment: wine on the weekend  . Drug use: No  . Sexual activity: Not on file  Other Topics Concern  . Not on file  Social History Narrative  . Not on file    FAMILY HISTORY: Family History  Problem Relation Age of Onset  . Breast cancer Daughter        [name]    ALLERGIES:  is allergic to cefdinir and protonix [pantoprazole sodium].  MEDICATIONS:  Current Outpatient Medications  Medication Sig Dispense Refill  . aspirin EC 81 MG tablet Take 81 mg by mouth daily.    . diphenhydrAMINE (BENADRYL) 25 MG tablet Take 50 mg by mouth every 4 (four) hours as needed for allergies.    . predniSONE (DELTASONE) 10 MG tablet Take 10 mg by mouth 2 (two) times daily.    . simvastatin (ZOCOR) 40 MG tablet Take 40 mg by mouth every evening.     . cimetidine (TAGAMET) 200 MG tablet Take 200 mg by mouth 2 (two) times daily as needed (indigestion).     No current facility-administered medications for this visit.       Marland Kitchen  PHYSICAL EXAMINATION: ECOG PERFORMANCE STATUS: 1 - Symptomatic but completely ambulatory  Vitals:   03/07/17 1339  BP: (!) 124/53  Pulse: 74  Temp: 97.6 F (36.4 C)  SpO2: 100%   Filed Weights   03/07/17 1339  Weight: 171 lb 3 oz (77.7 kg)    GENERAL: Well-nourished well-developed; Alert, no distress and comfortable.   Accompanied by family. EYES: no pallor or icterus OROPHARYNX: no thrush or ulceration; good dentition  NECK: supple, no masses felt; thyroid surgical scar noted. LYMPH:  no palpable lymphadenopathy in the cervical, axillary or inguinal regions LUNGS:  clear to auscultation and  No wheeze or crackles HEART/CVS: regular rate & rhythm and no murmurs; No lower extremity edema ABDOMEN: abdomen soft, non-tender and normal bowel sounds Musculoskeletal:no cyanosis of digits and no clubbing  PSYCH: alert & oriented x 3 with fluent speech NEURO: no focal motor/sensory deficits SKIN: Maculopapular rash noted all over the body [recent antibiotic]  LABORATORY DATA:  I have reviewed the data as listed Lab Results  Component Value Date   WBC 7.2 03/07/2017   HGB 14.4 03/07/2017   HCT 42.1 03/07/2017   MCV 86.5 03/07/2017   PLT 269 03/07/2017   Recent Labs    02/21/17 1559  02/23/17 1755  02/26/17 0900 02/27/17 0419 03/07/17 1423  NA  --   --   --   --  136  --  129*  K  --   --   --   --  3.2*  --  4.7  CL  --   --   --   --  100*  --  95*  CO2  --   --   --   --  27  --  24  GLUCOSE  --   --   --   --  105*  --  147*  BUN  --   --   --   --  7  --  7  CREATININE 0.72  --   --   --  0.50  --  0.72  CALCIUM 8.8*   < >  --    < > 8.0* 7.8* 8.9  GFRNONAA >60  --   --   --  >60  --  >60  GFRAA >60  --   --   --  >60  --  >60  PROT  --   --   --   --   --   --  7.7  ALBUMIN  --   --  3.7  --   --   --  4.3  AST  --   --   --   --   --   --  25  ALT  --   --   --   --   --   --  19  ALKPHOS  --   --   --   --   --   --  58  BILITOT  --   --   --   --   --   --  0.5   < > = values in this interval not displayed.    RADIOGRAPHIC STUDIES: I have personally reviewed the radiological images as listed and agreed with the findings in the report. Dg Esophagus  Result Date: 02/27/2017 CLINICAL DATA:  Status post thyroidectomy last week with injury of the right piriform sinus. Evaluate for possible leak. EXAM: ESOPHOGRAM/BARIUM SWALLOW TECHNIQUE: Single contrast examination was performed using Isovue-300 and subsequently thin barium. FLUOROSCOPY TIME:  Fluoroscopy Time:  1 minute, 44 seconds Radiation Exposure Index (if provided by the  fluoroscopic device): 545 micro Gy per meters square Number of Acquired Spot Images: 3+ 5 video loops. COMPARISON:  None in PACs FINDINGS: The patient ingested the water-soluble contrast material without difficulty. There was a moderate amount of laryngeal penetration of the water-soluble contrast which elicited a cough reflex. The epiglottis is well defined and does not appear edematous. Considerable soft tissue edema to the right of midline in the hypopharynx is demonstrated. The right piriform sinus does not fill. No extraluminal contrast is observed. There is a tiny pulsion diverticulum noted from the posterior aspect of the proximal esophagus. This does not appear to reflect an acute injury. There is soft tissue fullness in the prevertebral region at approximately C6-7. There is no evidence of a leak here. IMPRESSION: Laryngeal penetration of the water-soluble contrast which elicits a cough reflex. No laryngeal penetration with thin barium was observed. The patient exhibited no signs of respiratory distress with this coughing. Non filling of the right piriform sinus with soft tissue mass effect displacing the contrast filled hypopharynx to the left. No frank leak is observed. Presumed tiny pulsion diverticulum arising from the posterior aspect of the upper cervical esophagus at the T5 level. Given its fairly smooth contours a post traumatic leak is felt less likely. Soft tissue fullness in the prevertebral region posterior to the upper cervical esophagus which likely reflects postoperative edema and underlying  cricopharyngeus muscle hypertrophy. If the patient's clinical status merits further imaging, CT scanning of the neck with IV and oral contrast would be a useful next step. Electronically Signed   By: David  Martinique M.D.   On: 02/27/2017 09:07    ASSESSMENT & PLAN:   Thyroid cancer ([place]) # Thyroid cancer papillary-stage III. Discussed pathology and stage with the patient and family.  Recommend  radioactive iodine as patient is high risk features [4.6 cm thyroid tumor; also]  #Given the availability of Thyrogen stimulated-RAI; I would not recommend low iron diet.  We will check thyroid profile today; significantly low I would recommend starting Synthroid/Cytomel.  This should not interfere with radioactive iodine treatment.  Discussed with Dr. Ilda Foil  #Rash secondary to antibiotic; currently off antibiotic; patient on prednisone taper; symptoms improving.  If not worse recommend-Benadryl.  Labs in 4 weeks-BMP thyroid panel;thyroglobulins levels; Follow up in 5 weeks/follow-up on fifth March-MD.    Discussed with Dr. Ilda Foil.  Also discussed with Dr. Tami Ribas.  Thank you Dr.McQueen for allowing me to participate in the care of your pleasant patient. Please do not hesitate to contact me with questions or concerns in the interim.  All questions were answered. The patient knows to call the clinic with any problems, questions or concerns.    Cammie Sickle, MD 03/07/2017 6:36 PM

## 2017-03-07 NOTE — Assessment & Plan Note (Addendum)
#   Thyroid cancer papillary-stage III. Discussed pathology and stage with the patient and family.  Recommend radioactive iodine as patient is high risk features [4.6 cm thyroid tumor; also]  #Given the availability of Thyrogen stimulated-RAI; I would not recommend low iron diet.  We will check thyroid profile today; significantly low I would recommend starting Synthroid/Cytomel.  This should not interfere with radioactive iodine treatment.  Discussed with Dr. Ilda Foil  #Rash secondary to antibiotic; currently off antibiotic; patient on prednisone taper; symptoms improving.  If not worse recommend-Benadryl.  Labs in 4 weeks-BMP thyroid panel;thyroglobulins levels; Follow up in 5 weeks/follow-up on fifth March-MD.    Discussed with Dr. Ilda Foil.  Also discussed with Dr. Tami Ribas.  Thank you Dr.McQueen for allowing me to participate in the care of your pleasant patient. Please do not hesitate to contact me with questions or concerns in the interim.

## 2017-03-07 NOTE — Progress Notes (Signed)
Patient has red rash noted to bilateral arms, Chest, and trunk from allergic reaction to cefdinir during hospital visit.

## 2017-03-08 LAB — THYROID PANEL WITH TSH
Free Thyroxine Index: 0.6 — ABNORMAL LOW (ref 1.2–4.9)
T3 UPTAKE RATIO: 17 % — AB (ref 24–39)
T4, Total: 3.7 ug/dL — ABNORMAL LOW (ref 4.5–12.0)
TSH: 10.07 u[IU]/mL — AB (ref 0.450–4.500)

## 2017-03-09 ENCOUNTER — Telehealth: Payer: Self-pay | Admitting: Internal Medicine

## 2017-03-09 MED ORDER — LEVOTHYROXINE SODIUM 100 MCG PO TABS: 100.0000 ug | ORAL_TABLET | Freq: Every day | ORAL | 1 refills | Status: DC

## 2017-03-09 NOTE — Telephone Encounter (Signed)
H/B: Please inform patient that would recommend starting Synthroid [prescription sent to pharmacy] to help with her low thyroid levels after surgery; follow-up with me as planned at last visit

## 2017-03-09 NOTE — Telephone Encounter (Signed)
Left vm for patient - requested return phone call regarding a new medication that md would like to start pt on. Left vm on phone# [phone] . Cell phone is not a working number.

## 2017-03-10 ENCOUNTER — Telehealth: Payer: Self-pay | Admitting: *Deleted

## 2017-03-10 NOTE — Telephone Encounter (Signed)
Returned patient's phone call. Discussed possible side effect of alopecia with patient. Pt has not yet started the levothyroxine. Pt wanted to know if alopecia was a very common of side effect. I explained to her that some patients do experience hair thinning with synthroid. I also explained that hair thinning can also occur with an abnormal thyroid condition. Discussed other general side effects of synthroid with patient. Pt states that she has not yet started on the synthroid dosing. She plans on starting this medication tomorrow. Patient reminded to take medication in am 1 hr prior to breakfast on empty stomach. Pt gave verbal understanding. Teach back process performed.

## 2017-03-10 NOTE — Telephone Encounter (Signed)
Pt returned phone call.- see phone note from 03/10/17

## 2017-03-10 NOTE — Telephone Encounter (Signed)
Patient requesting call back as she has questions regarding her Levothyroxine

## 2017-03-17 ENCOUNTER — Encounter: Payer: Self-pay | Admitting: General Surgery

## 2017-04-03 DIAGNOSIS — C73 Malignant neoplasm of thyroid gland: Secondary | ICD-10-CM | POA: Diagnosis not present

## 2017-04-04 ENCOUNTER — Inpatient Hospital Stay: Payer: Medicare HMO | Attending: Internal Medicine

## 2017-04-04 DIAGNOSIS — C73 Malignant neoplasm of thyroid gland: Secondary | ICD-10-CM | POA: Insufficient documentation

## 2017-04-04 LAB — BASIC METABOLIC PANEL
Anion gap: 6 (ref 5–15)
BUN: 13 mg/dL (ref 6–20)
CALCIUM: 8.9 mg/dL (ref 8.9–10.3)
CO2: 29 mmol/L (ref 22–32)
Chloride: 100 mmol/L — ABNORMAL LOW (ref 101–111)
Creatinine, Ser: 0.75 mg/dL (ref 0.44–1.00)
GLUCOSE: 86 mg/dL (ref 65–99)
Potassium: 4.2 mmol/L (ref 3.5–5.1)
SODIUM: 135 mmol/L (ref 135–145)

## 2017-04-05 LAB — TGAB+THYROGLOBULIN IMA OR RIA: Thyroglobulin Antibody: <1 IU/mL (ref 0.0–0.9)

## 2017-04-05 LAB — THYROID PANEL WITH TSH
FREE THYROXINE INDEX: 1.8 (ref 1.2–4.9)
T3 UPTAKE RATIO: 23 % — AB (ref 24–39)
T4 TOTAL: 7.9 ug/dL (ref 4.5–12.0)
TSH: 16.1 u[IU]/mL — ABNORMAL HIGH (ref 0.450–4.500)

## 2017-04-05 LAB — THYROGLOBULIN BY IMA: THYROGLOBULIN BY: 0.6 ng/mL — AB (ref 1.5–38.5)

## 2017-04-11 ENCOUNTER — Inpatient Hospital Stay: Payer: Medicare HMO | Attending: Internal Medicine | Admitting: Internal Medicine

## 2017-04-11 ENCOUNTER — Other Ambulatory Visit: Payer: Self-pay

## 2017-04-11 VITALS — BP 146/75 | HR 71 | Temp 96.5°F | Resp 18 | Wt 173.8 lb

## 2017-04-11 DIAGNOSIS — E871 Hypo-osmolality and hyponatremia: Secondary | ICD-10-CM | POA: Diagnosis not present

## 2017-04-11 DIAGNOSIS — C73 Malignant neoplasm of thyroid gland: Secondary | ICD-10-CM | POA: Diagnosis not present

## 2017-04-11 DIAGNOSIS — E039 Hypothyroidism, unspecified: Secondary | ICD-10-CM | POA: Insufficient documentation

## 2017-04-11 MED ORDER — LEVOTHYROXINE SODIUM 125 MCG PO TABS: 125.0000 ug | ORAL_TABLET | Freq: Every day | ORAL | 4 refills | Status: DC

## 2017-04-11 NOTE — Progress Notes (Signed)
[place] NOTE  Patient Care Team: [name], MD as PCP - General (Internal Medicine) [name], MD as Consulting Physician (General Surgery)  CHIEF COMPLAINTS/PURPOSE OF CONSULTATION: Thyroid cancer  #  Oncology History   # JAN 2019- PAPILLARY THYROID CA [Dr.McQueen/Sankar] ; s/p Total thyroidectomy;  RIGHT LOBE, 4.6 CM IN GREATEST DIMENSION,  WITH EXTRATHYROIDAL EXTENSION; pT4apNx; STAGE III    -----------------------------------------------------------------------------------  THYROID GLAND:  Procedure: Total thyroidectomy  Tumor Focality: Unifocal  Tumor Site: Right lobe extending into isthmus  Tumor Size: 4.6 cm in greatest dimension  Histologic Type: Papillary carcinoma, classic type  Margins: Negative for carcinoma  Angioinvasion (Vascular Invasion): Present  Lymphatic Invasion: Not identified  Extrathyroidal Extension: Present  Extent: Clinical/macroscopic invasion of thyroid cartilage,  clinical/macroscopic invasion up to the pyriform sinus mucosa, and  macroscopic and microscopic invasion of strap muscles  Regional Lymph Nodes: Lymph nodes not submitted or found  Pathologic Stage Classification (pTNM, AJCC 8th Edition): pT4a pNX     Thyroid cancer (HCC)     HISTORY OF PRESENTING ILLNESS:  Tanya Frey 72 y.o.  female patient with papillary thyroid cancer stage III-status post a total thyroidectomy is here for follow-up.   Given the severe fatigue hyponatremia-symptomatic hypothyroidism patient was started on Synthroid at last visit 100 mcg a day.   Patient energy levels are improved.  She feels better.  Her appetite is improving.   She was recently evaluated by ENT Dr. Tami Ribas.  Hoarseness of voice is present overall improving.  Her skin rash is current resolved-which was thought secondary to antibiotic  ROS: A complete 10 point review of system is done which is negative except mentioned above in history of  present illness  MEDICAL HISTORY:  Past Medical History:  Diagnosis Date  . Anemia   . Breast cancer ([place])    daugther  . Difficult intubation   . Diffuse cystic mastopathy   . Family history of malignant neoplasm of breast   . Hyperlipidemia   . Obesity, unspecified   . Personal history of tobacco use, presenting hazards to health   . Varicose veins     SURGICAL HISTORY: Past Surgical History:  Procedure Laterality Date  . BREAST EXCISIONAL BIOPSY Bilateral 1989   neg  . COLONOSCOPY  2011   Dr. [name]; colon polyps (tubular adenoma)  . COLONOSCOPY N/A   . COLONOSCOPY WITH PROPOFOL N/A 10/10/2014   Procedure: COLONOSCOPY WITH PROPOFOL;  Surgeon: [name], MD;  Location: [facility] ENDOSCOPY;  Service: Endoscopy;  Laterality: N/A;  . THYROID SURGERY Bilateral   . THYROIDECTOMY N/A 02/21/2017   Procedure: THYROIDECTOMY;  Surgeon: [name], MD;  Location: [facility] ORS;  Service: ENT;  Laterality: N/A;  . TUBAL LIGATION    . VARICOSE VEIN SURGERY    . vein closure procedure Right 2009    SOCIAL HISTORY: Social History   Socioeconomic History  . Marital status: Married    Spouse name: Not on file  . Number of children: Not on file  . Years of education: Not on file  . Highest education level: Not on file  Social Needs  . Financial resource strain: Not on file  . Food insecurity - worry: Not on file  . Food insecurity - inability: Not on file  . Transportation needs - medical: Not on file  . Transportation needs - non-medical: Not on file  Occupational History  . Not on file  Tobacco Use  . Smoking status: Former Smoker  Packs/day: 1.00    Years: 10.00    Pack years: 10.00    Last attempt to quit: 02/07/1990    Years since quitting: 27.1  . Smokeless tobacco: Never Used  Substance and Sexual Activity  . Alcohol use: Yes    Alcohol/week: 0.5 - 2.0 oz    Types: 1 - 4 Standard drinks or equivalent per week    Comment: wine on the weekend  . Drug use: No  .  Sexual activity: Not on file  Other Topics Concern  . Not on file  Social History Narrative  . Not on file    FAMILY HISTORY: Family History  Problem Relation Age of Onset  . Breast cancer Daughter        [name]    ALLERGIES:  is allergic to cefdinir and protonix [pantoprazole sodium].  MEDICATIONS:  Current Outpatient Medications  Medication Sig Dispense Refill  . aspirin EC 81 MG tablet Take 81 mg by mouth daily.    . simvastatin (ZOCOR) 40 MG tablet Take 40 mg by mouth every evening.     . cimetidine (TAGAMET) 200 MG tablet Take 200 mg by mouth 2 (two) times daily as needed (indigestion).    . diphenhydrAMINE (BENADRYL) 25 MG tablet Take 50 mg by mouth every 4 (four) hours as needed for allergies.    Marland Kitchen levothyroxine (SYNTHROID) 125 MCG tablet Take 1 tablet (125 mcg total) by mouth daily before breakfast. 30 tablet 4  . predniSONE (DELTASONE) 10 MG tablet Take 10 mg by mouth 2 (two) times daily.     No current facility-administered medications for this visit.       Marland Kitchen  PHYSICAL EXAMINATION: ECOG PERFORMANCE STATUS: 1 - Symptomatic but completely ambulatory  Vitals:   04/11/17 1001  BP: (!) 146/75  Pulse: 71  Resp: 18  Temp: (!) 96.5 F (35.8 C)   Filed Weights   04/11/17 1001  Weight: 173 lb 13.3 oz (78.8 kg)    GENERAL: Well-nourished well-developed; Alert, no distress and comfortable.   Accompanied by family. EYES: no pallor or icterus OROPHARYNX: no thrush or ulceration; good dentition  NECK: supple, no masses felt; thyroid surgical scar noted. LYMPH:  no palpable lymphadenopathy in the cervical, axillary or inguinal regions LUNGS: clear to auscultation and  No wheeze or crackles HEART/CVS: regular rate & rhythm and no murmurs; No lower extremity edema ABDOMEN: abdomen soft, non-tender and normal bowel sounds Musculoskeletal:no cyanosis of digits and no clubbing  PSYCH: alert & oriented x 3 with fluent speech NEURO: no focal motor/sensory  deficits SKIN: Maculopapular rash noted all over the body [recent antibiotic]  LABORATORY DATA:  I have reviewed the data as listed Lab Results  Component Value Date   WBC 7.2 03/07/2017   HGB 14.4 03/07/2017   HCT 42.1 03/07/2017   MCV 86.5 03/07/2017   PLT 269 03/07/2017   Recent Labs    02/23/17 1755  02/26/17 0900 02/27/17 0419 03/07/17 1423 04/04/17 0848  NA  --   --  136  --  129* 135  K  --   --  3.2*  --  4.7 4.2  CL  --   --  100*  --  95* 100*  CO2  --   --  27  --  24 29  GLUCOSE  --   --  105*  --  147* 86  BUN  --   --  7  --  7 13  CREATININE  --   --  0.50  --  0.72 0.75  CALCIUM  --    < > 8.0* 7.8* 8.9 8.9  GFRNONAA  --   --  >60  --  >60 >60  GFRAA  --   --  >60  --  >60 >60  PROT  --   --   --   --  7.7  --   ALBUMIN 3.7  --   --   --  4.3  --   AST  --   --   --   --  25  --   ALT  --   --   --   --  19  --   ALKPHOS  --   --   --   --  58  --   BILITOT  --   --   --   --  0.5  --    < > = values in this interval not displayed.    RADIOGRAPHIC STUDIES: I have personally reviewed the radiological images as listed and agreed with the findings in the report. No results found.  ASSESSMENT & PLAN:   Thyroid cancer ([place]) # Thyroid cancer papillary-stage III; status post thyroidectomy. Recommend radioactive iodine as patient is high risk features [4.6 cm thyroid tumor]  #Discussed with Dr. Heywood Footman will get Thyrogen stimulated I-131 scan; and radioactive iodine.  This has been ordered.  # Hypothyroidism secondary to total thyroidectomy-patient currently on 100 mcg of Synthroid; however TSH today is 16 with normal T3-T4; recommend increasing Synthroid to 125 mcg. New script sent.   # Will check omniseq- on tumor sample at next visit.    # Follow up in 2 months/labs few days prior.  Discussed with Dr. Ilda Foil.  Cc: Dr.McQueen.   All questions were answered. The patient knows to call the clinic with any problems, questions or concerns.     Cammie Sickle, MD 04/11/2017 3:12 PM

## 2017-04-11 NOTE — Assessment & Plan Note (Addendum)
#   Thyroid cancer papillary-stage III; status post thyroidectomy. Recommend radioactive iodine as patient is high risk features [4.6 cm thyroid tumor]  #Discussed with Dr. Heywood Footman will get Thyrogen stimulated I-131 scan; and radioactive iodine.  This has been ordered.  # Hypothyroidism secondary to total thyroidectomy-patient currently on 100 mcg of Synthroid; however TSH today is 16 with normal T3-T4; recommend increasing Synthroid to 125 mcg. New script sent.   # Will check omniseq- on tumor sample at next visit.    # Follow up in 2 months/labs few days prior.  Discussed with Dr. Ilda Foil.  Cc: Dr.McQueen.

## 2017-04-11 NOTE — Progress Notes (Signed)
Here for follow up.  Stated that she is having hoarseness in her voice -no pain -but strained sounding she stated.

## 2017-04-21 ENCOUNTER — Telehealth: Payer: Self-pay | Admitting: *Deleted

## 2017-04-21 NOTE — Telephone Encounter (Signed)
-----   Message from Secundino Ginger sent at 04/21/2017  1:59 PM EDT ----- Regarding: medication Contact: [phone] Tanya Frey wants to know if she is supposed to take her thyroid medication The morning 3/18? She is suposed to have her injectio that day.

## 2017-04-21 NOTE — Telephone Encounter (Signed)
Spoke with nuc med dept-as patient inquired if she could take her synthroid.  Per advise of [facility] pt may take her synthroid

## 2017-04-24 ENCOUNTER — Other Ambulatory Visit: Payer: Self-pay | Admitting: Internal Medicine

## 2017-04-24 ENCOUNTER — Ambulatory Visit
Admission: RE | Admit: 2017-04-24 | Discharge: 2017-04-24 | Disposition: A | Discharge status: Home / Self Care | Payer: Medicare HMO | Source: Ambulatory Visit | Attending: Internal Medicine | Admitting: Internal Medicine

## 2017-04-24 ENCOUNTER — Encounter: Admission: RE | Admit: 2017-04-24 | Payer: Medicare HMO | Source: Ambulatory Visit

## 2017-04-24 DIAGNOSIS — C73 Malignant neoplasm of thyroid gland: Secondary | ICD-10-CM | POA: Diagnosis not present

## 2017-04-24 MED ORDER — THYROTROPIN ALFA 1.1 MG IM SOLR
0.9000 mg | INTRAMUSCULAR | Status: AC
  Administered 2017-04-24: 0.9 mg via INTRAMUSCULAR
  Filled 2017-04-24: qty 0.9

## 2017-04-25 ENCOUNTER — Encounter
Admission: RE | Admit: 2017-04-25 | Discharge: 2017-04-25 | Disposition: A | Discharge status: Home / Self Care | Payer: Medicare HMO | Source: Ambulatory Visit | Attending: Internal Medicine | Admitting: Internal Medicine

## 2017-04-25 DIAGNOSIS — C73 Malignant neoplasm of thyroid gland: Secondary | ICD-10-CM | POA: Diagnosis not present

## 2017-04-25 MED ORDER — THYROTROPIN ALFA 1.1 MG IM SOLR
0.9000 mg | INTRAMUSCULAR | Status: AC
  Administered 2017-04-25: 0.9 mg via INTRAMUSCULAR

## 2017-04-26 ENCOUNTER — Encounter
Admission: RE | Admit: 2017-04-26 | Discharge: 2017-04-26 | Disposition: A | Discharge status: Home / Self Care | Payer: Medicare HMO | Source: Ambulatory Visit | Attending: Internal Medicine | Admitting: Internal Medicine

## 2017-04-26 DIAGNOSIS — C73 Malignant neoplasm of thyroid gland: Secondary | ICD-10-CM | POA: Diagnosis not present

## 2017-04-26 MED ORDER — SODIUM IODIDE I 131 CAPSULE
130.5900 | Freq: Once | INTRAVENOUS | Status: AC | PRN
  Administered 2017-04-26: 130.59 via ORAL

## 2017-04-28 ENCOUNTER — Encounter: Payer: Medicare HMO | Attending: Internal Medicine

## 2017-05-04 ENCOUNTER — Encounter
Admission: RE | Admit: 2017-05-04 | Discharge: 2017-05-04 | Disposition: A | Discharge status: Home / Self Care | Payer: Medicare HMO | Source: Ambulatory Visit | Attending: Internal Medicine | Admitting: Internal Medicine

## 2017-05-04 DIAGNOSIS — C73 Malignant neoplasm of thyroid gland: Secondary | ICD-10-CM

## 2017-05-09 ENCOUNTER — Telehealth: Payer: Self-pay | Admitting: Internal Medicine

## 2017-05-09 NOTE — Telephone Encounter (Signed)
I Spoke to patient regarding the thyroid scan s/p RAI;  no distant metastatic disease noted.  Follow-up as planned.

## 2017-06-06 ENCOUNTER — Inpatient Hospital Stay: Payer: Medicare HMO

## 2017-06-06 ENCOUNTER — Inpatient Hospital Stay: Payer: Medicare HMO | Attending: Internal Medicine | Admitting: Internal Medicine

## 2017-06-06 VITALS — BP 128/81 | HR 72 | Temp 97.3°F | Resp 16 | Wt 172.5 lb

## 2017-06-06 DIAGNOSIS — E669 Obesity, unspecified: Secondary | ICD-10-CM | POA: Insufficient documentation

## 2017-06-06 DIAGNOSIS — E785 Hyperlipidemia, unspecified: Secondary | ICD-10-CM | POA: Diagnosis not present

## 2017-06-06 DIAGNOSIS — M8580 Other specified disorders of bone density and structure, unspecified site: Secondary | ICD-10-CM | POA: Diagnosis not present

## 2017-06-06 DIAGNOSIS — Z87891 Personal history of nicotine dependence: Secondary | ICD-10-CM | POA: Diagnosis not present

## 2017-06-06 DIAGNOSIS — C73 Malignant neoplasm of thyroid gland: Secondary | ICD-10-CM

## 2017-06-06 DIAGNOSIS — Z7982 Long term (current) use of aspirin: Secondary | ICD-10-CM | POA: Diagnosis not present

## 2017-06-06 DIAGNOSIS — Z853 Personal history of malignant neoplasm of breast: Secondary | ICD-10-CM | POA: Insufficient documentation

## 2017-06-06 DIAGNOSIS — E890 Postprocedural hypothyroidism: Secondary | ICD-10-CM | POA: Insufficient documentation

## 2017-06-06 DIAGNOSIS — Z79899 Other long term (current) drug therapy: Secondary | ICD-10-CM | POA: Diagnosis not present

## 2017-06-06 LAB — COMPREHENSIVE METABOLIC PANEL
ALT: 15 U/L (ref 14–54)
ANION GAP: 6 (ref 5–15)
AST: 20 U/L (ref 15–41)
Albumin: 3.8 g/dL (ref 3.5–5.0)
Alkaline Phosphatase: 45 U/L (ref 38–126)
BILIRUBIN TOTAL: 0.4 mg/dL (ref 0.3–1.2)
BUN: 11 mg/dL (ref 6–20)
CHLORIDE: 103 mmol/L (ref 101–111)
CO2: 26 mmol/L (ref 22–32)
Calcium: 8.8 mg/dL — ABNORMAL LOW (ref 8.9–10.3)
Creatinine, Ser: 0.74 mg/dL (ref 0.44–1.00)
Glucose, Bld: 111 mg/dL — ABNORMAL HIGH (ref 65–99)
POTASSIUM: 4.2 mmol/L (ref 3.5–5.1)
Sodium: 135 mmol/L (ref 135–145)
TOTAL PROTEIN: 6.8 g/dL (ref 6.5–8.1)

## 2017-06-06 LAB — CBC WITH DIFFERENTIAL/PLATELET
BASOS ABS: 0 10*3/uL (ref 0–0.1)
Basophils Relative: 1 %
EOS PCT: 3 %
Eosinophils Absolute: 0.1 10*3/uL (ref 0–0.7)
HCT: 38.4 % (ref 35.0–47.0)
HEMOGLOBIN: 13 g/dL (ref 12.0–16.0)
LYMPHS PCT: 38 %
Lymphs Abs: 1.3 10*3/uL (ref 1.0–3.6)
MCH: 29.3 pg (ref 26.0–34.0)
MCHC: 33.8 g/dL (ref 32.0–36.0)
MCV: 86.7 fL (ref 80.0–100.0)
Monocytes Absolute: 0.3 10*3/uL (ref 0.2–0.9)
Monocytes Relative: 10 %
NEUTROS ABS: 1.6 10*3/uL (ref 1.4–6.5)
Neutrophils Relative %: 48 %
PLATELETS: 207 10*3/uL (ref 150–440)
RBC: 4.42 MIL/uL (ref 3.80–5.20)
RDW: 14.8 % — ABNORMAL HIGH (ref 11.5–14.5)
WBC: 3.4 10*3/uL — ABNORMAL LOW (ref 3.6–11.0)

## 2017-06-06 NOTE — Progress Notes (Signed)
[place] NOTE  Patient Care Team: [name], MD as PCP - General (Internal Medicine) [name], MD as Consulting Physician (General Surgery)  CHIEF COMPLAINTS/PURPOSE OF CONSULTATION: Thyroid cancer  #  Oncology History   # JAN 2019- PAPILLARY THYROID CA [Dr.McQueen/Sankar] ; s/p Total thyroidectomy;  RIGHT LOBE, 4.6 CM IN GREATEST DIMENSION,  WITH EXTRATHYROIDAL EXTENSION; pT4apNx; STAGE III s/p RAI [ 04/26/2017; Dr.Edmunds]    -----------------------------------------------------------------------------------  THYROID GLAND:  Procedure: Total thyroidectomy  Tumor Focality: Unifocal  Tumor Site: Right lobe extending into isthmus  Tumor Size: 4.6 cm in greatest dimension  Histologic Type: Papillary carcinoma, classic type  Margins: Negative for carcinoma  Angioinvasion (Vascular Invasion): Present  Lymphatic Invasion: Not identified  Extrathyroidal Extension: Present  Extent: Clinical/macroscopic invasion of thyroid cartilage,  clinical/macroscopic invasion up to the pyriform sinus mucosa, and  macroscopic and microscopic invasion of strap muscles  Regional Lymph Nodes: Lymph nodes not submitted or found  Pathologic Stage Classification (pTNM, AJCC 8th Edition): pT4a pNX     Thyroid cancer (HCC)     HISTORY OF PRESENTING ILLNESS:  Tanya Frey 72 y.o.  female patient with papillary thyroid cancer stage III-status post a total thyroidectomy is here for follow-up.   In the interim patient is status post radioactive iodine therapeutic dose.   She complains of mild itching/soreness at the site of the surgery.  However denies any significant pain.  Patient energy levels are improved.  She feels better.  Her appetite is improving.  Denies any palpitations.  Denies any weight loss.  Denies any hot flashes.  ROS: A complete 10 point review of system is done which is negative except mentioned above in history of present  illness  MEDICAL HISTORY:  Past Medical History:  Diagnosis Date  . Anemia   . Breast cancer ([place])    daugther  . Difficult intubation   . Diffuse cystic mastopathy   . Family history of malignant neoplasm of breast   . Hyperlipidemia   . Obesity, unspecified   . Personal history of tobacco use, presenting hazards to health   . Varicose veins     SURGICAL HISTORY: Past Surgical History:  Procedure Laterality Date  . BREAST EXCISIONAL BIOPSY Bilateral 1989   neg  . COLONOSCOPY  2011   Dr. [name]; colon polyps (tubular adenoma)  . COLONOSCOPY N/A   . COLONOSCOPY WITH PROPOFOL N/A 10/10/2014   Procedure: COLONOSCOPY WITH PROPOFOL;  Surgeon: [name], MD;  Location: [facility] ENDOSCOPY;  Service: Endoscopy;  Laterality: N/A;  . THYROID SURGERY Bilateral   . THYROIDECTOMY N/A 02/21/2017   Procedure: THYROIDECTOMY;  Surgeon: [name], MD;  Location: [facility] ORS;  Service: ENT;  Laterality: N/A;  . TUBAL LIGATION    . VARICOSE VEIN SURGERY    . vein closure procedure Right 2009    SOCIAL HISTORY: Social History   Socioeconomic History  . Marital status: Married    Spouse name: Not on file  . Number of children: Not on file  . Years of education: Not on file  . Highest education level: Not on file  Occupational History  . Not on file  Social Needs  . Financial resource strain: Not on file  . Food insecurity:    Worry: Not on file    Inability: Not on file  . Transportation needs:    Medical: Not on file    Non-medical: Not on file  Tobacco Use  . Smoking status: Former Smoker  Packs/day: 1.00    Years: 10.00    Pack years: 10.00    Last attempt to quit: 02/07/1990    Years since quitting: 27.3  . Smokeless tobacco: Never Used  Substance and Sexual Activity  . Alcohol use: Yes    Alcohol/week: 0.5 - 2.0 oz    Types: 1 - 4 Standard drinks or equivalent per week    Comment: wine on the weekend  . Drug use: No  . Sexual activity: Not on file  Lifestyle   . Physical activity:    Days per week: Not on file    Minutes per session: Not on file  . Stress: Not on file  Relationships  . Social connections:    Talks on phone: Not on file    Gets together: Not on file    Attends religious service: Not on file    Active member of club or organization: Not on file    Attends meetings of clubs or organizations: Not on file    Relationship status: Not on file  . Intimate partner violence:    Fear of current or ex partner: Not on file    Emotionally abused: Not on file    Physically abused: Not on file    Forced sexual activity: Not on file  Other Topics Concern  . Not on file  Social History Narrative  . Not on file    FAMILY HISTORY: Family History  Problem Relation Age of Onset  . Breast cancer Daughter        Tanya Frey    ALLERGIES:  is allergic to cefdinir and protonix [pantoprazole sodium].  MEDICATIONS:  Current Outpatient Medications  Medication Sig Dispense Refill  . aspirin EC 81 MG tablet Take 81 mg by mouth daily.    . diphenhydrAMINE (BENADRYL) 25 MG tablet Take 50 mg by mouth every 4 (four) hours as needed for allergies.    Marland Kitchen levothyroxine (SYNTHROID) 125 MCG tablet Take 1 tablet (125 mcg total) by mouth daily before breakfast. 30 tablet 4  . simvastatin (ZOCOR) 40 MG tablet Take 40 mg by mouth every evening.     . cimetidine (TAGAMET) 200 MG tablet Take 200 mg by mouth 2 (two) times daily as needed (indigestion).    . predniSONE (DELTASONE) 10 MG tablet Take 10 mg by mouth 2 (two) times daily.     No current facility-administered medications for this visit.       Marland Kitchen  PHYSICAL EXAMINATION: ECOG PERFORMANCE STATUS: 1 - Symptomatic but completely ambulatory  Vitals:   06/06/17 1049  BP: 128/81  Pulse: 72  Resp: 16  Temp: (!) 97.3 F (36.3 C)   Filed Weights   06/06/17 1049  Weight: 172 lb 8.2 oz (78.3 kg)    GENERAL: Well-nourished well-developed; Alert, no distress and comfortable.   Accompanied by  family. EYES: no pallor or icterus OROPHARYNX: no thrush or ulceration; good dentition  NECK: supple, no masses felt; thyroid surgical scar noted. LYMPH:  no palpable lymphadenopathy in the cervical, axillary or inguinal regions LUNGS: clear to auscultation and  No wheeze or crackles HEART/CVS: regular rate & rhythm and no murmurs; No lower extremity edema ABDOMEN: abdomen soft, non-tender and normal bowel sounds Musculoskeletal:no cyanosis of digits and no clubbing  PSYCH: alert & oriented x 3 with fluent speech NEURO: no focal motor/sensory deficits SKIN: no skin rash.   LABORATORY DATA:  I have reviewed the data as listed Lab Results  Component Value Date   WBC 3.4 (  L) 06/06/2017   HGB 13.0 06/06/2017   HCT 38.4 06/06/2017   MCV 86.7 06/06/2017   PLT 207 06/06/2017   Recent Labs    02/23/17 1755  03/07/17 1423 04/04/17 0848 06/06/17 1039  NA  --    < > 129* 135 135  K  --    < > 4.7 4.2 4.2  CL  --    < > 95* 100* 103  CO2  --    < > 24 29 26   GLUCOSE  --    < > 147* 86 111*  BUN  --    < > 7 13 11   CREATININE  --    < > 0.72 0.75 0.74  CALCIUM  --    < > 8.9 8.9 8.8*  GFRNONAA  --    < > >60 >60 >60  GFRAA  --    < > >60 >60 >60  PROT  --   --  7.7  --  6.8  ALBUMIN 3.7  --  4.3  --  3.8  AST  --   --  25  --  20  ALT  --   --  19  --  15  ALKPHOS  --   --  58  --  45  BILITOT  --   --  0.5  --  0.4   < > = values in this interval not displayed.    RADIOGRAPHIC STUDIES: I have personally reviewed the radiological images as listed and agreed with the findings in the report. No results found.  ASSESSMENT & PLAN:   Thyroid cancer ([place]) # Thyroid cancer papillary-stage III; status post thyroidectomy.  Status post radioactive iodine treatment on March 20; a week later radioactive iodine scan-shows no evidence of any uptake anywhere else except for thyroid bed.   #Clinically patient doing well.  Recommend continued Synthroid-   # Hypothyroidism secondary to  total thyroidectomy-patient currently on 125 mcg of Synthroid; thyroid profile is pending.  Goal TSH would be less than 0.1.  We will refill Synthroid based upon labs from today  # Osteopenia-bone density October 2018; patient is at risk of osteoporosis given Synthroid use.  Recommend calcium and vitamin D.   # Will check omniseq- on tumor sample at next visit.    #Follow-up in 3 months/labs/CBC BMP thyroid profile; thyroglobulin-1 week prior.   All questions were answered. The patient knows to call the clinic with any problems, questions or concerns.    Cammie Sickle, MD 06/06/2017 11:38 AM

## 2017-06-06 NOTE — Assessment & Plan Note (Addendum)
#   Thyroid cancer papillary-stage III; status post thyroidectomy.  Status post radioactive iodine treatment on March 20; a week later radioactive iodine scan-shows no evidence of any uptake anywhere else except for thyroid bed.   #Clinically patient doing well.  Recommend continued Synthroid-   # Hypothyroidism secondary to total thyroidectomy-patient currently on 125 mcg of Synthroid; thyroid profile is pending.  Goal TSH would be less than 0.1.  We will refill Synthroid based upon labs from today  # Osteopenia-bone density October 2018; patient is at risk of osteoporosis given Synthroid use.  Recommend calcium and vitamin D.   # Will check omniseq- on tumor sample at next visit.    #Follow-up in 3 months/labs/CBC BMP thyroid profile; thyroglobulin-1 week prior.

## 2017-06-07 LAB — THYROID PANEL WITH TSH
FREE THYROXINE INDEX: 2.3 (ref 1.2–4.9)
T3 Uptake Ratio: 27 % (ref 24–39)
T4, Total: 8.6 ug/dL (ref 4.5–12.0)
TSH: 0.267 u[IU]/mL — AB (ref 0.450–4.500)

## 2017-06-09 ENCOUNTER — Telehealth: Payer: Self-pay | Admitting: *Deleted

## 2017-06-09 NOTE — Telephone Encounter (Signed)
Mr Berrian instructed to have Avin continue the same dose of thyroid med

## 2017-06-09 NOTE — Telephone Encounter (Signed)
I will have Dr. B review the patient's thyroid panels and team will reach out to the patient.

## 2017-06-09 NOTE — Telephone Encounter (Signed)
-----   Message from Secundino Ginger sent at 06/09/2017  8:44 AM EDT ----- Regarding: lab results Contact: [phone] According to labs does she need new medication or still takes what she's got. She only has 2 pills left and her husband wanted to know so he could get that med refilled or pick up a new one.

## 2017-06-09 NOTE — Telephone Encounter (Signed)
Husband called asking what dose of thyroid medicine patient needs and if they need a new dose. Please advise

## 2017-06-09 NOTE — Telephone Encounter (Signed)
Dr. B said to keep taking the same dose.(No changes)

## 2017-06-16 DIAGNOSIS — E039 Hypothyroidism, unspecified: Secondary | ICD-10-CM | POA: Diagnosis not present

## 2017-06-16 DIAGNOSIS — E7800 Pure hypercholesterolemia, unspecified: Secondary | ICD-10-CM | POA: Diagnosis not present

## 2017-06-16 DIAGNOSIS — Z0001 Encounter for general adult medical examination with abnormal findings: Secondary | ICD-10-CM | POA: Diagnosis not present

## 2017-06-23 DIAGNOSIS — E7800 Pure hypercholesterolemia, unspecified: Secondary | ICD-10-CM | POA: Diagnosis not present

## 2017-06-23 DIAGNOSIS — E039 Hypothyroidism, unspecified: Secondary | ICD-10-CM | POA: Insufficient documentation

## 2017-06-23 DIAGNOSIS — Z0001 Encounter for general adult medical examination with abnormal findings: Secondary | ICD-10-CM | POA: Diagnosis not present

## 2017-06-23 DIAGNOSIS — Z0000 Encounter for general adult medical examination without abnormal findings: Secondary | ICD-10-CM | POA: Diagnosis not present

## 2017-07-17 DIAGNOSIS — D225 Melanocytic nevi of trunk: Secondary | ICD-10-CM | POA: Diagnosis not present

## 2017-07-17 DIAGNOSIS — D2272 Melanocytic nevi of left lower limb, including hip: Secondary | ICD-10-CM | POA: Diagnosis not present

## 2017-07-17 DIAGNOSIS — D2262 Melanocytic nevi of left upper limb, including shoulder: Secondary | ICD-10-CM | POA: Diagnosis not present

## 2017-07-17 DIAGNOSIS — D2271 Melanocytic nevi of right lower limb, including hip: Secondary | ICD-10-CM | POA: Diagnosis not present

## 2017-07-17 DIAGNOSIS — D2261 Melanocytic nevi of right upper limb, including shoulder: Secondary | ICD-10-CM | POA: Diagnosis not present

## 2017-08-28 ENCOUNTER — Other Ambulatory Visit: Payer: Self-pay | Admitting: *Deleted

## 2017-08-28 ENCOUNTER — Other Ambulatory Visit: Payer: Self-pay | Admitting: Internal Medicine

## 2017-08-28 DIAGNOSIS — C73 Malignant neoplasm of thyroid gland: Secondary | ICD-10-CM

## 2017-08-29 ENCOUNTER — Inpatient Hospital Stay: Payer: Medicare HMO | Attending: Internal Medicine

## 2017-08-29 ENCOUNTER — Other Ambulatory Visit: Payer: Medicare HMO

## 2017-08-29 DIAGNOSIS — Z87891 Personal history of nicotine dependence: Secondary | ICD-10-CM | POA: Diagnosis not present

## 2017-08-29 DIAGNOSIS — C73 Malignant neoplasm of thyroid gland: Secondary | ICD-10-CM | POA: Insufficient documentation

## 2017-08-29 DIAGNOSIS — E890 Postprocedural hypothyroidism: Secondary | ICD-10-CM | POA: Diagnosis not present

## 2017-08-29 DIAGNOSIS — Z923 Personal history of irradiation: Secondary | ICD-10-CM | POA: Diagnosis not present

## 2017-08-29 DIAGNOSIS — M8580 Other specified disorders of bone density and structure, unspecified site: Secondary | ICD-10-CM | POA: Diagnosis not present

## 2017-08-29 LAB — COMPREHENSIVE METABOLIC PANEL WITH GFR
ALT: 13 U/L (ref 0–44)
AST: 16 U/L (ref 15–41)
Albumin: 4 g/dL (ref 3.5–5.0)
Alkaline Phosphatase: 47 U/L (ref 38–126)
Anion gap: 10 (ref 5–15)
BUN: 16 mg/dL (ref 8–23)
CO2: 24 mmol/L (ref 22–32)
Calcium: 8.9 mg/dL (ref 8.9–10.3)
Chloride: 104 mmol/L (ref 98–111)
Creatinine, Ser: 0.63 mg/dL (ref 0.44–1.00)
GFR calc Af Amer: >60 mL/min
GFR calc non Af Amer: >60 mL/min
Glucose, Bld: 108 mg/dL — ABNORMAL HIGH (ref 70–99)
Potassium: 4.6 mmol/L (ref 3.5–5.1)
Sodium: 138 mmol/L (ref 135–145)
Total Bilirubin: 0.4 mg/dL (ref 0.3–1.2)
Total Protein: 7 g/dL (ref 6.5–8.1)

## 2017-08-29 LAB — CBC WITH DIFFERENTIAL/PLATELET
Basophils Absolute: 0.1 10*3/uL (ref 0–0.1)
Basophils Relative: 2 %
EOS ABS: 0.1 10*3/uL (ref 0–0.7)
EOS PCT: 3 %
HCT: 40.2 % (ref 35.0–47.0)
HEMOGLOBIN: 13.3 g/dL (ref 12.0–16.0)
LYMPHS ABS: 1.5 10*3/uL (ref 1.0–3.6)
Lymphocytes Relative: 42 %
MCH: 28.9 pg (ref 26.0–34.0)
MCHC: 33.1 g/dL (ref 32.0–36.0)
MCV: 87.3 fL (ref 80.0–100.0)
Monocytes Absolute: 0.3 10*3/uL (ref 0.2–0.9)
Monocytes Relative: 8 %
NEUTROS PCT: 45 %
Neutro Abs: 1.7 10*3/uL (ref 1.4–6.5)
PLATELETS: 204 10*3/uL (ref 150–440)
RBC: 4.61 MIL/uL (ref 3.80–5.20)
RDW: 14.7 % — ABNORMAL HIGH (ref 11.5–14.5)
WBC: 3.6 10*3/uL (ref 3.6–11.0)

## 2017-08-30 LAB — THYROGLOBULIN BY IMA: THYROGLOBULIN BY: 0.1 ng/mL — AB (ref 1.5–38.5)

## 2017-08-30 LAB — THYROID PANEL WITH TSH
FREE THYROXINE INDEX: 2.3 (ref 1.2–4.9)
T3 Uptake Ratio: 26 % (ref 24–39)
T4 TOTAL: 8.7 ug/dL (ref 4.5–12.0)
TSH: 0.58 u[IU]/mL (ref 0.450–4.500)

## 2017-08-30 LAB — TGAB+THYROGLOBULIN IMA OR RIA

## 2017-09-05 ENCOUNTER — Other Ambulatory Visit: Payer: Self-pay

## 2017-09-05 ENCOUNTER — Inpatient Hospital Stay: Payer: Medicare HMO | Admitting: Internal Medicine

## 2017-09-05 ENCOUNTER — Encounter: Payer: Self-pay | Admitting: Internal Medicine

## 2017-09-05 VITALS — BP 133/83 | HR 64 | Temp 97.9°F | Resp 20 | Ht 65.0 in | Wt 177.2 lb

## 2017-09-05 DIAGNOSIS — C73 Malignant neoplasm of thyroid gland: Secondary | ICD-10-CM | POA: Diagnosis not present

## 2017-09-05 DIAGNOSIS — E890 Postprocedural hypothyroidism: Secondary | ICD-10-CM

## 2017-09-05 DIAGNOSIS — Z87891 Personal history of nicotine dependence: Secondary | ICD-10-CM

## 2017-09-05 DIAGNOSIS — M8580 Other specified disorders of bone density and structure, unspecified site: Secondary | ICD-10-CM

## 2017-09-05 DIAGNOSIS — Z923 Personal history of irradiation: Secondary | ICD-10-CM | POA: Diagnosis not present

## 2017-09-05 MED ORDER — LEVOTHYROXINE SODIUM 150 MCG PO TABS: 150.0000 ug | ORAL_TABLET | Freq: Every day | ORAL | 3 refills | Status: DC

## 2017-09-05 NOTE — Assessment & Plan Note (Addendum)
#   Thyroid cancer papillary-stage III; status post thyroidectomy; status post radioactive iodine.  Stable no evidence of recurrence.  Ordering ultrasound of the neck in approximately 3 months.  #Clinically patient doing well.  Recommend continued Synthroid.  However patient is status 0.5; goal will be less than 0.1.  We will increase the dose of Synthroid to 150 mcg once a day.  # Osteopenia-bone density October 2018; patient is at risk of osteoporosis given Synthroid use.  Stable continue calcium vitamin D.  #Follow-up in 3 months/labs/CBC BMP thyroid profile; thyroglobulin-1 week prior.

## 2017-09-05 NOTE — Progress Notes (Signed)
[place] OFFICE PROGRESS NOTE  Patient Care Team: Tanya Frey as PCP - General (Internal Medicine) Tanya Frey as Consulting Physician (General Surgery)  Cancer Staging No matching staging information was found for the patient.   Oncology History   # JAN 2019- PAPILLARY THYROID CA [Tanya Frey/Tanya Frey] ; s/p Total thyroidectomy;  RIGHT LOBE, 4.6 CM IN GREATEST DIMENSION,  WITH EXTRATHYROIDAL EXTENSION; pT4apNx; STAGE III s/p RAI [ 04/26/2017; Tanya Frey]    -----------------------------------------------------------------------------------  THYROID GLAND:  Procedure: Total thyroidectomy  Tumor Focality: Unifocal  Tumor Site: Right lobe extending into isthmus  Tumor Size: 4.6 cm in greatest dimension  Histologic Type: Papillary carcinoma, classic type  Margins: Negative for carcinoma  Angioinvasion (Vascular Invasion): Present  Lymphatic Invasion: Not identified  Extrathyroidal Extension: Present  Extent: Clinical/macroscopic invasion of thyroid cartilage,  clinical/macroscopic invasion up to the pyriform sinus mucosa, and  macroscopic and microscopic invasion of strap muscles  Regional Lymph Nodes: Lymph nodes not submitted or found  Pathologic Stage Classification (pTNM, AJCC 8th Edition): pT4a pNX     Thyroid cancer (Tanya Frey)      INTERVAL HISTORY:  Tanya Frey 72 y.o.  female pleasant patient above history of stage III papillary carcinoma thyroid status post radioactive iodine currently on Synthroid is here for follow-up.  The patient denies any unusual fatigue.  Denies any lumps or bumps in the neck.  No hot flashes.  No weight loss.  No palpitations.  Review of Systems  Constitutional: Negative for chills, diaphoresis, fever, malaise/fatigue and weight loss.  HENT: Negative for nosebleeds and sore throat.   Eyes: Negative for double vision.  Respiratory: Negative for cough, hemoptysis, sputum production, shortness of breath  and wheezing.   Cardiovascular: Negative for chest pain, palpitations, orthopnea and leg swelling.  Gastrointestinal: Negative for abdominal pain, blood in stool, constipation, diarrhea, heartburn, melena, nausea and vomiting.  Genitourinary: Negative for dysuria, frequency and urgency.  Musculoskeletal: Negative for back pain and joint pain.  Skin: Negative.  Negative for itching and rash.  Neurological: Negative for dizziness, tingling, focal weakness, weakness and headaches.  Endo/Heme/Allergies: Does not bruise/bleed easily.  Psychiatric/Behavioral: Negative for depression. The patient is not nervous/anxious and does not have insomnia.       PAST MEDICAL HISTORY :  Past Medical History:  Diagnosis Date  . Anemia   . Breast cancer ([place])    daugther  . Difficult intubation   . Diffuse cystic mastopathy   . Family history of malignant neoplasm of breast   . Hyperlipidemia   . Obesity, unspecified   . Personal history of tobacco use, presenting hazards to health   . Varicose veins     PAST SURGICAL HISTORY :   Past Surgical History:  Procedure Laterality Date  . BREAST EXCISIONAL BIOPSY Bilateral 1989   neg  . COLONOSCOPY  2011   Tanya Frey; colon polyps (tubular adenoma)  . COLONOSCOPY N/A   . COLONOSCOPY WITH PROPOFOL N/A 10/10/2014   Procedure: COLONOSCOPY WITH PROPOFOL;  Surgeon: Tanya Silvas, Frey;  Location: [facility] ENDOSCOPY;  Service: Endoscopy;  Laterality: N/A;  . THYROID SURGERY Bilateral   . THYROIDECTOMY N/A 02/21/2017   Procedure: THYROIDECTOMY;  Surgeon: Tanya Gust, Frey;  Location: [facility] ORS;  Service: ENT;  Laterality: N/A;  . TUBAL LIGATION    . VARICOSE VEIN SURGERY    . vein closure procedure Right 2009    FAMILY HISTORY :   Family History  Problem Relation Age of Onset  . Breast  cancer Daughter        Tanya Frey    SOCIAL HISTORY:   Social History   Tobacco Use  . Smoking status: Former Smoker    Packs/day: 1.00    Years: 10.00     Pack years: 10.00    Last attempt to quit: 02/07/1990    Years since quitting: 27.6  . Smokeless tobacco: Never Used  Substance Use Topics  . Alcohol use: Yes    Alcohol/week: 1.0 - 4.0 standard drinks    Types: 1 - 4 Standard drinks or equivalent per week    Comment: wine on the weekend  . Drug use: No    ALLERGIES:  is allergic to cefdinir and protonix [pantoprazole sodium].  MEDICATIONS:  Current Outpatient Medications  Medication Sig Dispense Refill  . aspirin EC 81 MG tablet Take 81 mg by mouth daily.    . diphenhydrAMINE (BENADRYL) 25 MG tablet Take 50 mg by mouth every 4 (four) hours as needed for allergies.    . simvastatin (ZOCOR) 40 MG tablet Take 40 mg by mouth every evening.     Marland Kitchen levothyroxine (SYNTHROID, LEVOTHROID) 150 MCG tablet Take 1 tablet (150 mcg total) by mouth daily before breakfast. 90 tablet 3   No current facility-administered medications for this visit.     PHYSICAL EXAMINATION: ECOG PERFORMANCE STATUS: 1 - Symptomatic but completely ambulatory  BP 133/83   Pulse 64   Temp 97.9 F (36.6 C) (Tympanic)   Resp 20   Ht 5\' 5"  (1.651 m)   Wt 177 lb 4 oz (80.4 kg)   BMI 29.50 kg/m   Filed Weights   09/05/17 1057  Weight: 177 lb 4 oz (80.4 kg)    GENERAL: Well-nourished well-developed; Alert, no distress and comfortable.  Accompanied by husband.  EYES: no pallor or icterus OROPHARYNX: no thrush or ulceration; NECK: supple; no lymph nodes felt. LYMPH:  no palpable lymphadenopathy in the axillary or inguinal regions LUNGS: Decreased breath sounds auscultation bilaterally. No wheeze or crackles HEART/CVS: regular rate & rhythm and no murmurs; No lower extremity edema ABDOMEN:abdomen soft, non-tender and normal bowel sounds. No hepatomegaly or splenomegaly.  Musculoskeletal:no cyanosis of digits and no clubbing  PSYCH: alert & oriented x 3 with fluent speech NEURO: no focal motor/sensory deficits SKIN:  no rashes or significant  lesions    LABORATORY DATA:  I have reviewed the data as listed    Component Value Date/Time   NA 138 08/29/2017 0949   K 4.6 08/29/2017 0949   CL 104 08/29/2017 0949   CO2 24 08/29/2017 0949   GLUCOSE 108 (H) 08/29/2017 0949   BUN 16 08/29/2017 0949   CREATININE 0.63 08/29/2017 0949   CALCIUM 8.9 08/29/2017 0949   PROT 7.0 08/29/2017 0949   ALBUMIN 4.0 08/29/2017 0949   AST 16 08/29/2017 0949   ALT 13 08/29/2017 0949   ALKPHOS 47 08/29/2017 0949   BILITOT 0.4 08/29/2017 0949   GFRNONAA >60 08/29/2017 0949   GFRAA >60 08/29/2017 0949    No results found for: SPEP, UPEP  Lab Results  Component Value Date   WBC 3.6 08/29/2017   NEUTROABS 1.7 08/29/2017   HGB 13.3 08/29/2017   HCT 40.2 08/29/2017   MCV 87.3 08/29/2017   PLT 204 08/29/2017      Chemistry      Component Value Date/Time   NA 138 08/29/2017 0949   K 4.6 08/29/2017 0949   CL 104 08/29/2017 0949   CO2 24 08/29/2017 0949  BUN 16 08/29/2017 0949   CREATININE 0.63 08/29/2017 0949      Component Value Date/Time   CALCIUM 8.9 08/29/2017 0949   ALKPHOS 47 08/29/2017 0949   AST 16 08/29/2017 0949   ALT 13 08/29/2017 0949   BILITOT 0.4 08/29/2017 0949       RADIOGRAPHIC STUDIES: I have personally reviewed the radiological images as listed and agreed with the findings in the report. No results found.   ASSESSMENT & PLAN:  Thyroid cancer ([place]) # Thyroid cancer papillary-stage III; status post thyroidectomy; status post radioactive iodine.  Stable no evidence of recurrence.  Ordering ultrasound of the neck in approximately 3 months.  #Clinically patient doing well.  Recommend continued Synthroid.  However patient is status 0.5; goal will be less than 0.1.  We will increase the dose of Synthroid to 150 mcg once a day.  # Osteopenia-bone density October 2018; patient is at risk of osteoporosis given Synthroid use.  Stable continue calcium vitamin D.  #Follow-up in 3 months/labs/CBC BMP thyroid  profile; thyroglobulin-1 week prior.    Orders Placed This Encounter  Procedures  . US Soft Tissue Head/Neck    Standing Status:   Future    Standing Expiration Date:   09/05/2018    Order Specific Question:   Reason for Exam (SYMPTOM  OR DIAGNOSIS REQUIRED)    Answer:   thyroid cancer    Order Specific Question:   Preferred imaging location?    Answer:   [facility]   All questions were answered. The patient knows to call the clinic with any problems, questions or concerns.      Cammie Sickle, Frey 09/17/2017 5:50 PM

## 2017-10-16 ENCOUNTER — Other Ambulatory Visit: Payer: Self-pay

## 2017-10-16 DIAGNOSIS — Z1231 Encounter for screening mammogram for malignant neoplasm of breast: Secondary | ICD-10-CM

## 2017-11-27 ENCOUNTER — Ambulatory Visit
Admission: RE | Admit: 2017-11-27 | Discharge: 2017-11-27 | Disposition: A | Discharge status: Home / Self Care | Payer: Medicare HMO | Source: Ambulatory Visit | Attending: General Surgery | Admitting: General Surgery

## 2017-11-27 ENCOUNTER — Other Ambulatory Visit: Payer: Self-pay | Admitting: Internal Medicine

## 2017-11-27 ENCOUNTER — Telehealth: Payer: Self-pay | Admitting: *Deleted

## 2017-11-27 DIAGNOSIS — Z1231 Encounter for screening mammogram for malignant neoplasm of breast: Secondary | ICD-10-CM | POA: Insufficient documentation

## 2017-11-27 DIAGNOSIS — C73 Malignant neoplasm of thyroid gland: Secondary | ICD-10-CM

## 2017-11-27 DIAGNOSIS — E890 Postprocedural hypothyroidism: Secondary | ICD-10-CM

## 2017-11-27 DIAGNOSIS — Z9009 Acquired absence of other part of head and neck: Secondary | ICD-10-CM

## 2017-11-27 NOTE — Telephone Encounter (Signed)
Patient is ordered a Non Thyroid US and with a hours/o thyroid cancer she thinks this is a wrong order and wants to change it to a US Thyroid and not head and Neck US Non Thyroid. She is sending a new order to be signed

## 2017-11-28 ENCOUNTER — Inpatient Hospital Stay: Payer: Medicare HMO | Attending: Internal Medicine

## 2017-11-28 ENCOUNTER — Ambulatory Visit
Admission: RE | Admit: 2017-11-28 | Discharge: 2017-11-28 | Disposition: A | Discharge status: Home / Self Care | Payer: Medicare HMO | Source: Ambulatory Visit | Attending: Internal Medicine | Admitting: Internal Medicine

## 2017-11-28 DIAGNOSIS — Z85850 Personal history of malignant neoplasm of thyroid: Secondary | ICD-10-CM | POA: Insufficient documentation

## 2017-11-28 DIAGNOSIS — C73 Malignant neoplasm of thyroid gland: Secondary | ICD-10-CM

## 2017-11-28 DIAGNOSIS — M8580 Other specified disorders of bone density and structure, unspecified site: Secondary | ICD-10-CM | POA: Diagnosis not present

## 2017-11-28 DIAGNOSIS — R1310 Dysphagia, unspecified: Secondary | ICD-10-CM | POA: Diagnosis not present

## 2017-11-28 DIAGNOSIS — R490 Dysphonia: Secondary | ICD-10-CM | POA: Diagnosis not present

## 2017-11-28 DIAGNOSIS — E890 Postprocedural hypothyroidism: Secondary | ICD-10-CM

## 2017-11-28 DIAGNOSIS — Z87891 Personal history of nicotine dependence: Secondary | ICD-10-CM | POA: Insufficient documentation

## 2017-11-28 DIAGNOSIS — E0789 Other specified disorders of thyroid: Secondary | ICD-10-CM | POA: Diagnosis not present

## 2017-11-28 DIAGNOSIS — Z9009 Acquired absence of other part of head and neck: Secondary | ICD-10-CM | POA: Insufficient documentation

## 2017-11-28 DIAGNOSIS — Z79899 Other long term (current) drug therapy: Secondary | ICD-10-CM | POA: Insufficient documentation

## 2017-11-28 LAB — CBC WITH DIFFERENTIAL/PLATELET
Abs Immature Granulocytes: 0.01 10*3/uL (ref 0.00–0.07)
BASOS ABS: 0.1 10*3/uL (ref 0.0–0.1)
BASOS PCT: 1 %
EOS ABS: 0.1 10*3/uL (ref 0.0–0.5)
Eosinophils Relative: 2 %
HEMATOCRIT: 38.4 % (ref 36.0–46.0)
Hemoglobin: 12.7 g/dL (ref 12.0–15.0)
IMMATURE GRANULOCYTES: 0 %
LYMPHS ABS: 1.7 10*3/uL (ref 0.7–4.0)
Lymphocytes Relative: 47 %
MCH: 29.3 pg (ref 26.0–34.0)
MCHC: 33.1 g/dL (ref 30.0–36.0)
MCV: 88.5 fL (ref 80.0–100.0)
Monocytes Absolute: 0.4 10*3/uL (ref 0.1–1.0)
Monocytes Relative: 9 %
NEUTROS PCT: 41 %
NRBC: 0 % (ref 0.0–0.2)
Neutro Abs: 1.5 10*3/uL — ABNORMAL LOW (ref 1.7–7.7)
PLATELETS: 203 10*3/uL (ref 150–400)
RBC: 4.34 MIL/uL (ref 3.87–5.11)
RDW: 13.7 % (ref 11.5–15.5)
WBC: 3.8 10*3/uL — ABNORMAL LOW (ref 4.0–10.5)

## 2017-11-28 LAB — COMPREHENSIVE METABOLIC PANEL
ALK PHOS: 47 U/L (ref 38–126)
ALT: 16 U/L (ref 0–44)
ANION GAP: 10 (ref 5–15)
AST: 20 U/L (ref 15–41)
Albumin: 4 g/dL (ref 3.5–5.0)
BUN: 10 mg/dL (ref 8–23)
CALCIUM: 8.7 mg/dL — AB (ref 8.9–10.3)
CO2: 26 mmol/L (ref 22–32)
Chloride: 101 mmol/L (ref 98–111)
Creatinine, Ser: 0.63 mg/dL (ref 0.44–1.00)
GFR calc non Af Amer: >60 mL/min (ref >60)
Glucose, Bld: 109 mg/dL — ABNORMAL HIGH (ref 70–99)
POTASSIUM: 4.4 mmol/L (ref 3.5–5.1)
Sodium: 137 mmol/L (ref 135–145)
Total Bilirubin: 0.5 mg/dL (ref 0.3–1.2)
Total Protein: 7.2 g/dL (ref 6.5–8.1)

## 2017-11-29 LAB — TGAB+THYROGLOBULIN IMA OR RIA: Thyroglobulin Antibody: <1 IU/mL (ref 0.0–0.9)

## 2017-11-29 LAB — THYROGLOBULIN BY IMA

## 2017-11-29 LAB — THYROID PANEL WITH TSH
Free Thyroxine Index: 3.2 (ref 1.2–4.9)
T3 Uptake Ratio: 30 % (ref 24–39)
T4, Total: 10.5 ug/dL (ref 4.5–12.0)
TSH: 0.05 u[IU]/mL — ABNORMAL LOW (ref 0.450–4.500)

## 2017-12-05 ENCOUNTER — Inpatient Hospital Stay (HOSPITAL_BASED_OUTPATIENT_CLINIC_OR_DEPARTMENT_OTHER): Payer: Medicare HMO | Admitting: Internal Medicine

## 2017-12-05 ENCOUNTER — Encounter: Payer: Self-pay | Admitting: Internal Medicine

## 2017-12-05 VITALS — BP 127/78 | HR 60 | Temp 98.1°F | Resp 16 | Wt 176.4 lb

## 2017-12-05 DIAGNOSIS — Z87891 Personal history of nicotine dependence: Secondary | ICD-10-CM

## 2017-12-05 DIAGNOSIS — Z79899 Other long term (current) drug therapy: Secondary | ICD-10-CM | POA: Diagnosis not present

## 2017-12-05 DIAGNOSIS — Z85850 Personal history of malignant neoplasm of thyroid: Secondary | ICD-10-CM | POA: Diagnosis not present

## 2017-12-05 DIAGNOSIS — M8580 Other specified disorders of bone density and structure, unspecified site: Secondary | ICD-10-CM

## 2017-12-05 DIAGNOSIS — R490 Dysphonia: Secondary | ICD-10-CM

## 2017-12-05 DIAGNOSIS — R1310 Dysphagia, unspecified: Secondary | ICD-10-CM

## 2017-12-05 DIAGNOSIS — C73 Malignant neoplasm of thyroid gland: Secondary | ICD-10-CM

## 2017-12-05 NOTE — Assessment & Plan Note (Addendum)
#   Thyroid cancer papillary-stage III; status post thyroidectomy; status post radioactive iodine [March 2019]; October 2019 -US neck negative for recurrence.  Stable.  #Clinically no evidence of recurrence; continue Synthroid at current dose at 150 mcg a day.  TSH 0.05; normal T3-T4/at goal.  #Osteopenia-October 2018/monitor closely on thyroid suppression.  Continue calcium plus vitamin D.  Recommend again bone density in 2020  #Hoarseness of voice/intermittent difficulty swallowing-clinically stable.  If worse defer to ENT.  # DISPOSITION:   # Follow-up in 6 months/labs/CBC BMP thyroid profile; thyroglobulin-1 week prior.

## 2017-12-05 NOTE — Progress Notes (Signed)
[place] OFFICE PROGRESS NOTE  Patient Care Team: [name], MD as PCP - General (Internal Medicine) [name], MD as Consulting Physician (General Surgery)  Cancer Staging No matching staging information was found for the patient.   Oncology History   # JAN 2019- PAPILLARY THYROID CA [Dr.McQueen/Sankar] ; s/p Total thyroidectomy;  RIGHT LOBE, 4.6 CM IN GREATEST DIMENSION,  WITH EXTRATHYROIDAL EXTENSION; pT4apNx; STAGE III s/p RAI [ 04/26/2017; Dr.Edmunds]    -----------------------------------------------------------------------------------  THYROID GLAND:  Procedure: Total thyroidectomy  Tumor Focality: Unifocal  Tumor Site: Right lobe extending into isthmus  Tumor Size: 4.6 cm in greatest dimension  Histologic Type: Papillary carcinoma, classic type  Margins: Negative for carcinoma  Angioinvasion (Vascular Invasion): Present  Lymphatic Invasion: Not identified  Extrathyroidal Extension: Present  Extent: Clinical/macroscopic invasion of thyroid cartilage,  clinical/macroscopic invasion up to the pyriform sinus mucosa, and  macroscopic and microscopic invasion of strap muscles  Regional Lymph Nodes: Lymph nodes not submitted or found  Pathologic Stage Classification (pTNM, AJCC 8th Edition): pT4a pNX  DIAGNOSIS: Thyroid CA  STAGE:  III   ;GOALS: cure  CURRENT/MOST RECENT THERAPY; s/p RAIU; on synthroid     Thyroid cancer (HCC)      INTERVAL HISTORY:  Tanya Frey 72 y.o.  female pleasant patient above history of stage III papillary carcinoma thyroid status post radioactive iodine currently on Synthroid is here for follow-up.  Patient has intermittent difficulty swallowing which is fairly chronic since surgery.  Is not getting any worse.  She is concerned this is not getting any better.  Intermittent hoarseness of voice.  No unusual fatigue.  No nausea no vomiting.  Review of Systems  Constitutional: Negative for chills,  diaphoresis, fever, malaise/fatigue and weight loss.  HENT: Negative for nosebleeds and sore throat.   Eyes: Negative for double vision.  Respiratory: Negative for cough, hemoptysis, sputum production, shortness of breath and wheezing.   Cardiovascular: Negative for chest pain, palpitations, orthopnea and leg swelling.  Gastrointestinal: Negative for abdominal pain, blood in stool, constipation, diarrhea, heartburn, melena, nausea and vomiting.  Genitourinary: Negative for dysuria, frequency and urgency.  Musculoskeletal: Negative for back pain and joint pain.  Skin: Negative.  Negative for itching and rash.  Neurological: Negative for dizziness, tingling, focal weakness, weakness and headaches.  Endo/Heme/Allergies: Does not bruise/bleed easily.  Psychiatric/Behavioral: Negative for depression. The patient is not nervous/anxious and does not have insomnia.       PAST MEDICAL HISTORY :  Past Medical History:  Diagnosis Date  . Anemia   . Breast cancer ([place])    daugther  . Difficult intubation   . Diffuse cystic mastopathy   . Family history of malignant neoplasm of breast   . Hyperlipidemia   . Obesity, unspecified   . Personal history of tobacco use, presenting hazards to health   . Varicose veins     PAST SURGICAL HISTORY :   Past Surgical History:  Procedure Laterality Date  . BREAST EXCISIONAL BIOPSY Bilateral 1989   neg  . COLONOSCOPY  2011   Dr. [name]; colon polyps (tubular adenoma)  . COLONOSCOPY N/A   . COLONOSCOPY WITH PROPOFOL N/A 10/10/2014   Procedure: COLONOSCOPY WITH PROPOFOL;  Surgeon: [name], MD;  Location: [facility] ENDOSCOPY;  Service: Endoscopy;  Laterality: N/A;  . THYROID SURGERY Bilateral   . THYROIDECTOMY N/A 02/21/2017   Procedure: THYROIDECTOMY;  Surgeon: [name], MD;  Location: [facility] ORS;  Service: ENT;  Laterality: N/A;  . TUBAL LIGATION    .  VARICOSE VEIN SURGERY    . vein closure procedure Right 2009    FAMILY HISTORY :    Family History  Problem Relation Age of Onset  . Breast cancer Daughter        [name]    SOCIAL HISTORY:   Social History   Tobacco Use  . Smoking status: Former Smoker    Packs/day: 1.00    Years: 10.00    Pack years: 10.00    Last attempt to quit: 02/07/1990    Years since quitting: 27.8  . Smokeless tobacco: Never Used  Substance Use Topics  . Alcohol use: Yes    Alcohol/week: 1.0 - 4.0 standard drinks    Types: 1 - 4 Standard drinks or equivalent per week    Comment: wine on the weekend  . Drug use: No    ALLERGIES:  is allergic to cefdinir and protonix [pantoprazole sodium].  MEDICATIONS:  Current Outpatient Medications  Medication Sig Dispense Refill  . aspirin EC 81 MG tablet Take 81 mg by mouth daily.    Marland Kitchen levothyroxine (SYNTHROID, LEVOTHROID) 150 MCG tablet Take 1 tablet (150 mcg total) by mouth daily before breakfast. 90 tablet 3  . simvastatin (ZOCOR) 40 MG tablet Take 40 mg by mouth every evening.     . diphenhydrAMINE (BENADRYL) 25 MG tablet Take 50 mg by mouth every 4 (four) hours as needed for allergies.     No current facility-administered medications for this visit.     PHYSICAL EXAMINATION: ECOG PERFORMANCE STATUS: 1 - Symptomatic but completely ambulatory  BP 127/78 (BP Location: Left Arm, Patient Position: Sitting)   Pulse 60   Temp 98.1 F (36.7 C) (Tympanic)   Resp 16   Wt 176 lb 5.9 oz (80 kg)   BMI 29.35 kg/m   Filed Weights   12/05/17 1013  Weight: 176 lb 5.9 oz (80 kg)    Physical Exam  Constitutional: She is oriented to person, place, and time and well-developed, well-nourished, and in no distress.  Accompanied by husband.  Walking by self.  HENT:  Head: Normocephalic and atraumatic.  Mouth/Throat: Oropharynx is clear and moist. No oropharyngeal exudate.  Thyroidectomy scar noted.  No lumps or bumps felt in the neck.  Eyes: Pupils are equal, round, and reactive to light.  Neck: Normal range of motion. Neck supple.   Cardiovascular: Normal rate and regular rhythm.  Pulmonary/Chest: No respiratory distress. She has no wheezes.  Abdominal: Soft. Bowel sounds are normal. She exhibits no distension and no mass. There is no tenderness. There is no rebound and no guarding.  Musculoskeletal: Normal range of motion. She exhibits no edema or tenderness.  Neurological: She is alert and oriented to person, place, and time.  Skin: Skin is warm.  Psychiatric: Affect normal.      LABORATORY DATA:  I have reviewed the data as listed    Component Value Date/Time   NA 137 11/28/2017 1017   K 4.4 11/28/2017 1017   CL 101 11/28/2017 1017   CO2 26 11/28/2017 1017   GLUCOSE 109 (H) 11/28/2017 1017   BUN 10 11/28/2017 1017   CREATININE 0.63 11/28/2017 1017   CALCIUM 8.7 (L) 11/28/2017 1017   PROT 7.2 11/28/2017 1017   ALBUMIN 4.0 11/28/2017 1017   AST 20 11/28/2017 1017   ALT 16 11/28/2017 1017   ALKPHOS 47 11/28/2017 1017   BILITOT 0.5 11/28/2017 1017   GFRNONAA >60 11/28/2017 1017   GFRAA >60 11/28/2017 1017    No results  found for: SPEP, UPEP  Lab Results  Component Value Date   WBC 3.8 (L) 11/28/2017   NEUTROABS 1.5 (L) 11/28/2017   HGB 12.7 11/28/2017   HCT 38.4 11/28/2017   MCV 88.5 11/28/2017   PLT 203 11/28/2017      Chemistry      Component Value Date/Time   NA 137 11/28/2017 1017   K 4.4 11/28/2017 1017   CL 101 11/28/2017 1017   CO2 26 11/28/2017 1017   BUN 10 11/28/2017 1017   CREATININE 0.63 11/28/2017 1017      Component Value Date/Time   CALCIUM 8.7 (L) 11/28/2017 1017   ALKPHOS 47 11/28/2017 1017   AST 20 11/28/2017 1017   ALT 16 11/28/2017 1017   BILITOT 0.5 11/28/2017 1017       RADIOGRAPHIC STUDIES: I have personally reviewed the radiological images as listed and agreed with the findings in the report. No results found.   ASSESSMENT & PLAN:  Thyroid cancer ([place]) # Thyroid cancer papillary-stage III; status post thyroidectomy; status post radioactive iodine  [March 2019]; October 2019 -US neck negative for recurrence.  Stable.  #Clinically no evidence of recurrence; continue Synthroid at current dose at 150 mcg a day.  TSH 0.05; normal T3-T4/at goal.  #Osteopenia-October 2018/monitor closely on thyroid suppression.  Continue calcium plus vitamin D.  Recommend again bone density in 2020  #Hoarseness of voice/intermittent difficulty swallowing-clinically stable.  If worse defer to ENT.  # DISPOSITION:   # Follow-up in 6 months/labs/CBC BMP thyroid profile; thyroglobulin-1 week prior.   No orders of the defined types were placed in this encounter.  All questions were answered. The patient knows to call the clinic with any problems, questions or concerns.      Cammie Sickle, MD 12/05/2017 11:38 AM

## 2017-12-13 ENCOUNTER — Telehealth: Payer: Self-pay | Admitting: General Surgery

## 2017-12-13 NOTE — Telephone Encounter (Signed)
Per Selinda Eon, Patient needs to be moved to another date. I have called patient to move appt due to overbooked appointments on Dr Dwyane Luo schedule. NO answer. I have left a message on both voicemail's.

## 2017-12-19 ENCOUNTER — Other Ambulatory Visit: Payer: Self-pay

## 2017-12-19 ENCOUNTER — Ambulatory Visit: Payer: Medicare HMO | Admitting: General Surgery

## 2017-12-19 ENCOUNTER — Encounter: Payer: Self-pay | Admitting: General Surgery

## 2017-12-19 VITALS — BP 142/82 | HR 69 | Resp 14 | Ht 65.0 in | Wt 176.0 lb

## 2017-12-19 DIAGNOSIS — Z803 Family history of malignant neoplasm of breast: Secondary | ICD-10-CM

## 2017-12-19 NOTE — Patient Instructions (Addendum)
Patient will be asked to return to the office in one year with a bilateral screening mammogram. Call Dr Tami Ribas with difficulty swallowing

## 2017-12-19 NOTE — Progress Notes (Signed)
Patient ID: Tanya Frey, female   DOB: [birth year], 72 y.o.   MRN: [id number]  Chief Complaint  Patient presents with  . Follow-up    HPI Tanya Frey is a 72 y.o. female.  who presents for a breast evaluation. The most recent mammogram was done on 11-27-17. No new breast issues. Patient does perform regular self breast checks and gets regular mammograms done.   She is still horse and has some trouble swallowing since her thyroid surgery in January  2019 with Dr Tami Ribas. She has not followed up with him in at least the last several months. She is retired from [employer].  HPI  Past Medical History:  Diagnosis Date  . Anemia   . Difficult intubation   . Diffuse cystic mastopathy   . Family history of malignant neoplasm of breast   . Hyperlipidemia   . Obesity, unspecified   . Personal history of tobacco use, presenting hazards to health   . Thyroid cancer ([place])    surgery and radioactive iodine   . Varicose veins     Past Surgical History:  Procedure Laterality Date  . BREAST EXCISIONAL BIOPSY Bilateral 1989   neg  . COLONOSCOPY  2011   Dr. [name]; colon polyps (tubular adenoma)  . COLONOSCOPY N/A   . COLONOSCOPY WITH PROPOFOL N/A 10/10/2014   Procedure: COLONOSCOPY WITH PROPOFOL;  Surgeon: [name], MD;  Location: [facility] ENDOSCOPY;  Service: Endoscopy;  Laterality: N/A;  . THYROID SURGERY Bilateral   . THYROIDECTOMY N/A 02/21/2017   Procedure: THYROIDECTOMY;  Surgeon: [name], MD;  Location: [facility] ORS;  Service: ENT;  Laterality: N/A;  . TUBAL LIGATION    . VARICOSE VEIN SURGERY    . vein closure procedure Right 2009    Family History  Problem Relation Age of Onset  . Breast cancer Daughter 26       [name] BRCA negative    Social History Social History   Tobacco Use  . Smoking status: Former Smoker    Packs/day: 1.00    Years: 10.00    Pack years: 10.00    Last attempt to quit: 02/07/1990    Years since quitting: 27.8   . Smokeless tobacco: Never Used  Substance Use Topics  . Alcohol use: Yes    Alcohol/week: 1.0 - 4.0 standard drinks    Types: 1 - 4 Standard drinks or equivalent per week    Comment: wine on the weekend  . Drug use: No    Allergies  Allergen Reactions  . Cefdinir Rash  . Protonix [Pantoprazole Sodium] Anaphylaxis and Other (See Comments)    Current Outpatient Medications  Medication Sig Dispense Refill  . aspirin EC 81 MG tablet Take 81 mg by mouth daily.    Marland Kitchen levothyroxine (SYNTHROID, LEVOTHROID) 150 MCG tablet Take 1 tablet (150 mcg total) by mouth daily before breakfast. 90 tablet 3  . simvastatin (ZOCOR) 40 MG tablet Take 40 mg by mouth every evening.      No current facility-administered medications for this visit.     Review of Systems Review of Systems  Constitutional: Negative.   Respiratory: Negative.   Cardiovascular: Negative.     Blood pressure (!) 142/82, pulse 69, resp. rate 14, height '5\' 5"'  (1.651 m), weight 176 lb (79.8 kg), SpO2 95 %.  Physical Exam Physical Exam  Constitutional: She is oriented to person, place, and time. She appears well-developed and well-nourished.  HENT:  Mouth/Throat: Oropharynx is clear and moist.  Eyes:  Conjunctivae are normal. No scleral icterus.  Neck: Neck supple.  Cardiovascular: Normal rate, regular rhythm and normal heart sounds.  Pulmonary/Chest: Effort normal and breath sounds normal. Right breast exhibits no inverted nipple, no mass, no nipple discharge, no skin change and no tenderness. Left breast exhibits no inverted nipple, no mass, no nipple discharge, no skin change and no tenderness.  previous biopsy sites well healed.    Lymphadenopathy:    She has no cervical adenopathy.    She has no axillary adenopathy.  Neurological: She is alert and oriented to person, place, and time.  Skin: Skin is warm and dry.  Psychiatric: Her behavior is normal.    Data Reviewed Bilateral screening mammograms dated November 27, 2017 were reviewed.  Postsurgical changes.  BI-RADS-1.  Operative note from January 2019 thyroidectomy..  Assessment    Benign breast exam.  Dysphasia post thyroidectomy.    Plan    Patient will be asked to return to the office in one year with a bilateral screening mammogram.  The patient has been encouraged to contact Dr Tami Ribas with her reports of ongoing difficulty swallowing.  The patient is aware to call back for any questions or concerns.       HPI, Physical Exam, Assessment and Plan have been scribed under the direction and in the presence of Robert Bellow, MD. Karie Fetch, RN  I have completed the exam and reviewed the above documentation for accuracy and completeness.  I agree with the above.  Haematologist has been used and any errors in dictation or transcription are unintentional.  Hervey Ard, M.D., F.A.C.S.  Forest Gleason  12/20/2017, 8:47 PM

## 2017-12-20 ENCOUNTER — Encounter: Payer: Self-pay | Admitting: General Surgery

## 2018-04-09 DIAGNOSIS — R399 Unspecified symptoms and signs involving the genitourinary system: Secondary | ICD-10-CM | POA: Diagnosis not present

## 2018-04-09 DIAGNOSIS — R8290 Unspecified abnormal findings in urine: Secondary | ICD-10-CM | POA: Diagnosis not present

## 2018-05-24 ENCOUNTER — Other Ambulatory Visit: Payer: Self-pay

## 2018-05-24 DIAGNOSIS — C73 Malignant neoplasm of thyroid gland: Secondary | ICD-10-CM

## 2018-05-29 ENCOUNTER — Inpatient Hospital Stay: Payer: Medicare HMO | Attending: Hematology and Oncology

## 2018-05-29 ENCOUNTER — Other Ambulatory Visit: Payer: Self-pay

## 2018-05-29 ENCOUNTER — Other Ambulatory Visit: Payer: Self-pay | Admitting: Hematology and Oncology

## 2018-05-29 DIAGNOSIS — C73 Malignant neoplasm of thyroid gland: Secondary | ICD-10-CM

## 2018-05-29 DIAGNOSIS — M8580 Other specified disorders of bone density and structure, unspecified site: Secondary | ICD-10-CM | POA: Insufficient documentation

## 2018-05-29 DIAGNOSIS — Z85850 Personal history of malignant neoplasm of thyroid: Secondary | ICD-10-CM | POA: Insufficient documentation

## 2018-05-29 LAB — CBC WITH DIFFERENTIAL/PLATELET
Abs Immature Granulocytes: 0.01 10*3/uL (ref 0.00–0.07)
Basophils Absolute: 0 10*3/uL (ref 0.0–0.1)
Basophils Relative: 1 %
Eosinophils Absolute: 0.1 10*3/uL (ref 0.0–0.5)
Eosinophils Relative: 3 %
HCT: 39.6 % (ref 36.0–46.0)
Hemoglobin: 13.1 g/dL (ref 12.0–15.0)
Immature Granulocytes: 0 %
Lymphocytes Relative: 42 %
Lymphs Abs: 1.7 10*3/uL (ref 0.7–4.0)
MCH: 28.4 pg (ref 26.0–34.0)
MCHC: 33.1 g/dL (ref 30.0–36.0)
MCV: 85.9 fL (ref 80.0–100.0)
Monocytes Absolute: 0.5 10*3/uL (ref 0.1–1.0)
Monocytes Relative: 11 %
Neutro Abs: 1.8 10*3/uL (ref 1.7–7.7)
Neutrophils Relative %: 43 %
Platelets: 200 10*3/uL (ref 150–400)
RBC: 4.61 MIL/uL (ref 3.87–5.11)
RDW: 14.4 % (ref 11.5–15.5)
WBC: 4.2 10*3/uL (ref 4.0–10.5)
nRBC: 0 % (ref 0.0–0.2)

## 2018-05-29 LAB — BASIC METABOLIC PANEL
Anion gap: 4 — ABNORMAL LOW (ref 5–15)
BUN: 13 mg/dL (ref 8–23)
CO2: 28 mmol/L (ref 22–32)
Calcium: 8.9 mg/dL (ref 8.9–10.3)
Chloride: 104 mmol/L (ref 98–111)
Creatinine, Ser: 0.58 mg/dL (ref 0.44–1.00)
GFR calc Af Amer: >60 mL/min (ref >60)
GFR calc non Af Amer: >60 mL/min (ref >60)
Glucose, Bld: 87 mg/dL (ref 70–99)
Potassium: 4.4 mmol/L (ref 3.5–5.1)
Sodium: 136 mmol/L (ref 135–145)

## 2018-05-30 LAB — THYROGLOBULIN BY IMA: Thyroglobulin by IMA: <0.1 ng/mL — ABNORMAL LOW (ref 1.5–38.5)

## 2018-05-30 LAB — THYROID PANEL
Free Thyroxine Index: 3.3 (ref 1.2–4.9)
T3 Uptake Ratio: 28 % (ref 24–39)
T4, Total: 11.8 ug/dL (ref 4.5–12.0)

## 2018-05-30 LAB — TGAB+THYROGLOBULIN IMA OR RIA: Thyroglobulin Antibody: <1 IU/mL (ref 0.0–0.9)

## 2018-06-05 ENCOUNTER — Ambulatory Visit: Payer: Medicare HMO | Admitting: Internal Medicine

## 2018-06-05 ENCOUNTER — Ambulatory Visit: Payer: Medicare HMO | Admitting: Hematology and Oncology

## 2018-06-20 DIAGNOSIS — E7800 Pure hypercholesterolemia, unspecified: Secondary | ICD-10-CM | POA: Diagnosis not present

## 2018-06-20 DIAGNOSIS — E039 Hypothyroidism, unspecified: Secondary | ICD-10-CM | POA: Diagnosis not present

## 2018-06-27 DIAGNOSIS — E039 Hypothyroidism, unspecified: Secondary | ICD-10-CM | POA: Diagnosis not present

## 2018-06-27 DIAGNOSIS — Z0000 Encounter for general adult medical examination without abnormal findings: Secondary | ICD-10-CM | POA: Diagnosis not present

## 2018-06-27 DIAGNOSIS — Z0001 Encounter for general adult medical examination with abnormal findings: Secondary | ICD-10-CM | POA: Diagnosis not present

## 2018-06-27 DIAGNOSIS — E7800 Pure hypercholesterolemia, unspecified: Secondary | ICD-10-CM | POA: Diagnosis not present

## 2018-07-30 ENCOUNTER — Other Ambulatory Visit: Payer: Self-pay

## 2018-07-30 DIAGNOSIS — C73 Malignant neoplasm of thyroid gland: Secondary | ICD-10-CM

## 2018-07-30 NOTE — Progress Notes (Signed)
[facility]  [address] [place], [place] [address] Phone: [phone]  Fax: [phone]   Clinic Day:  07/31/2018  Referring physician: [name], MD  Chief Complaint: Tanya Frey is a 73 y.o. female with stage III papillary carcinoma thyroid s/p radioactive iodine who is seen for new patient assessment.  HPI:  The patient was noted to have a right-sided thyroid nodule.  Biopsy on 12/28/2016 by Dr. Jamal Collin revealed papillary thyroid carcinoma.  Soft tissue neck CT on 01/11/2017 revealed a 5 cm complex cystic and solid mass arising from the right lobe of the thyroid compatible with known carcinoma. There was no malignant adenopathy.  She underwent thyroidectomy in 02/21/2017 by Dr. Tami Ribas.  Pathology revealed a 4.6 cm papillary thyroid carcinoma with extrathyroidal extension.  There was angioinvasion but no lymphatic invasion.  Margins were negative.  Pathologic stage was pT4a Nx (stage III).  She was initially seen by Dr Rogue Bussing on 03/07/2017.  I-131 was recommended secondary to high risk features.  She was started on Synthroid 100 mcg a day.  She received 130.6 mCi I-131 with Thyrogen stimulation on 04/26/2017.  Whole body I-131 scan on 05/04/2017 revealed uptake at thyroid bed consistent with thyroid remnant.  There was no scintigraphic evidence of iodine-avid metastatic thyroid cancer.  Thyroid ultrasound on 11/28/2017 revealed no recurrent disease.  The patient was last seen in the medical oncology clinic on 12/06/2018 by Dr. Rogue Bussing.  At that time, she had intermittent difficulty swallowing.  Symptoms were chronic since surgery.  Symptoms were not worse. She was concerned that her swallowing was not improving. She had intermittent hoarseness. She denied any fatigue, nausea, and vomiting.  TSH was 0.05 with normal T3-T4.  She continued Synthroid 150 mcg/day.  Bone density on 07/05/2016 revealed osteopenia with a T-score of -1.5  in L1-L4 and -13 in the left femoral neck.  She continued calcium and vitamin D.  She saw Dr. Bary Castilla for a follow-up breast evaluation on 11/18/2017.  Breast exam was benign.   Labs on 05/29/2018: WBC 4,200, ANC 1,800, hemoglobin 13.1, hematocrit 39.6, platelets 200,000.   sodium 136, BUN 13, creatinine 0.58.   Thyroglobliun antibody <1.0, T4 total 11.8, T3 Uptake Ratio 28, free thyroxine Index 3.3, and  Thyroglobulin by IMA <0.1.   Symptomatically, the patient says she is "fine, I guess." She has trouble swallowing solid foods such as bread and meat. It is easier to swallow solid foods when she has liquids.   She reports her voice is hoarse. She is concerned about the knot in her throat, and she states "it feels like a piece of skin is in there." She denies any fever, headaches, diarrhea, and sore throat. She reports having sinus issues.  She is interested in getting a blood test today.   Past Medical History:  Diagnosis Date   Anemia    Difficult intubation    Diffuse cystic mastopathy    Family history of malignant neoplasm of breast    Hyperlipidemia    Obesity, unspecified    Personal history of tobacco use, presenting hazards to health    Thyroid cancer ([place])    T4a, NX: 4.6 cm lesion with extrathyroidal extension. Received I 131 post procedure.     Varicose veins     Past Surgical History:  Procedure Laterality Date   BREAST EXCISIONAL BIOPSY Bilateral 1989   neg   COLONOSCOPY  2011   Dr. [name]; colon polyps (tubular adenoma)   COLONOSCOPY N/A  COLONOSCOPY WITH PROPOFOL N/A 10/10/2014   Procedure: COLONOSCOPY WITH PROPOFOL;  Surgeon: [name], MD;  Location: [facility] END[facility]COPY;  Service: Endoscopy;  Laterality: N/A;   THYROID SURGERY Bilateral    THYROIDECTOMY N/A 02/21/2017   Procedure: THYROIDECTOMY;  Surgeon: [name], MD;  Location: [facility] ORS;  Service: ENT;  Laterality: N/A;   TUBAL LIGATION     VARICOSE VEIN SURGERY     vein closure  procedure Right 2009    Family History  Problem Relation Age of Onset   Breast cancer Daughter 66       [name] BRCA negative    Social History:  reports that she quit smoking about 28 years ago. She has a 10.00 pack-year smoking history. She has never used smokeless tobacco. She reports current alcohol use of about 1.0 - 4.0 standard drinks of alcohol per week. She reports that she does not use drugs.  She is retired from [employer].  The patient is alone today.  Allergies:  Allergies  Allergen Reactions   Cefdinir Rash   Protonix [Pantoprazole Sodium] Anaphylaxis and Other (See Comments)    Current Medications: Current Outpatient Medications  Medication Sig Dispense Refill   aspirin EC 81 MG tablet Take 81 mg by mouth daily.     simvastatin (ZOCOR) 40 MG tablet Take 40 mg by mouth every evening.      levothyroxine (SYNTHROID, LEVOTHROID) 150 MCG tablet Take 1 tablet (150 mcg total) by mouth daily before breakfast. (Patient not taking: Reported on 07/31/2018) 90 tablet 3   No current facility-administered medications for this visit.     Review of Systems  Constitutional: Negative for chills, diaphoresis, fever, malaise/fatigue and weight loss.       "Fine, I guess."  HENT: Negative for nosebleeds and sore throat.        Hoarse.   Eyes: Negative for double vision.  Respiratory: Negative for cough, hemoptysis, sputum production, shortness of breath and wheezing.   Cardiovascular: Negative for chest pain, palpitations, orthopnea and leg swelling.  Gastrointestinal: Negative for abdominal pain, blood in stool, constipation, diarrhea, heartburn, melena, nausea and vomiting.       Trouble swallowing solids.  Chokes easily.  Genitourinary: Negative for dysuria, frequency and urgency.  Musculoskeletal: Negative for back pain and joint pain.  Skin: Negative.  Negative for itching and rash.  Neurological: Negative for dizziness, tingling, focal weakness,  weakness and headaches.  Endo/Heme/Allergies: Does not bruise/bleed easily.       Sinus issues.  Psychiatric/Behavioral: Negative for depression. The patient is not nervous/anxious and does not have insomnia.   All other systems reviewed and are negative.   Performance status (ECOG): 1  Vitals: Blood pressure 137/71, pulse 76, temperature (!) 97.4 F (36.3 C), height _0  (1.676 m), weight 172 lb 1.1 oz (78 kg), SpO2 100 %.   Physical Exam  Constitutional: She is oriented to person, place, and time. She appears well-developed and well-nourished. No distress.  HENT:  Head: Normocephalic and atraumatic.  Mouth/Throat: Oropharynx is clear and moist. No oropharyngeal exudate.  Styled black graying hair. N95 mask.  Hoarse.  Eyes: Pupils are equal, round, and reactive to light. Conjunctivae and EOM are normal. No scleral icterus.  Neck: Normal range of motion. Neck supple.  Thyroidectomy scar noted.  Cardiovascular: Normal rate, regular rhythm and normal heart sounds.  No murmur heard. Pulmonary/Chest: Effort normal and breath sounds normal. No respiratory distress. She has no wheezes. She has no rales.  Abdominal: Soft. Bowel sounds  are normal. She exhibits no mass. There is no abdominal tenderness. There is no rebound and no guarding.  Musculoskeletal: Normal range of motion.        General: No edema.  Lymphadenopathy:    She has no cervical adenopathy.    She has no axillary adenopathy.       Right: No supraclavicular adenopathy present.       Left: No supraclavicular adenopathy present.  Neurological: She is alert and oriented to person, place, and time. She has normal reflexes.  Skin: Skin is warm and dry. No rash noted. She is not diaphoretic. No erythema. No pallor.  Psychiatric: She has a normal mood and affect. Her behavior is normal. Judgment and thought content normal.  Nursing note and vitals reviewed.   No visits with results within 3 Day(s) from this visit.  Latest  known visit with results is:  Clinical Support on 05/29/2018  Component Date Value Ref Range Status   Sodium 05/29/2018 136  135 - 145 mmol/L Final   Potassium 05/29/2018 4.4  3.5 - 5.1 mmol/L Final   Chloride 05/29/2018 104  98 - 111 mmol/L Final   CO2 05/29/2018 28  22 - 32 mmol/L Final   Glucose, Bld 05/29/2018 87  70 - 99 mg/dL Final   BUN 05/29/2018 13  8 - 23 mg/dL Final   Creatinine, Ser 05/29/2018 0.58  0.44 - 1.00 mg/dL Final   Calcium 05/29/2018 8.9  8.9 - 10.3 mg/dL Final   GFR calc non Af Amer 05/29/2018 >60  >60 mL/min Final   GFR calc Af Amer 05/29/2018 >60  >60 mL/min Final   Anion gap 05/29/2018 4* 5 - 15 Final   Performed at [facility], [address] [place], [place] [address]   WBC 05/29/2018 4.2  4.0 - 10.5 K/uL Final   RBC 05/29/2018 4.61  3.87 - 5.11 MIL/uL Final   Hemoglobin 05/29/2018 13.1  12.0 - 15.0 g/dL Final   HCT 05/29/2018 39.6  36.0 - 46.0 % Final   MCV 05/29/2018 85.9  80.0 - 100.0 fL Final   MCH 05/29/2018 28.4  26.0 - 34.0 pg Final   MCHC 05/29/2018 33.1  30.0 - 36.0 g/dL Final   RDW 05/29/2018 14.4  11.5 - 15.5 % Final   Platelets 05/29/2018 200  150 - 400 K/uL Final   nRBC 05/29/2018 0.0  0.0 - 0.2 % Final   Neutrophils Relative % 05/29/2018 43  % Final   Neutro Abs 05/29/2018 1.8  1.7 - 7.7 K/uL Final   Lymphocytes Relative 05/29/2018 42  % Final   Lymphs Abs 05/29/2018 1.7  0.7 - 4.0 K/uL Final   Monocytes Relative 05/29/2018 11  % Final   Monocytes Absolute 05/29/2018 0.5  0.1 - 1.0 K/uL Final   Eosinophils Relative 05/29/2018 3  % Final   Eosinophils Absolute 05/29/2018 0.1  0.0 - 0.5 K/uL Final   Basophils Relative 05/29/2018 1  % Final   Basophils Absolute 05/29/2018 0.0  0.0 - 0.1 K/uL Final   Immature Granulocytes 05/29/2018 0  % Final   Abs Immature Granulocytes 05/29/2018 0.01  0.00 - 0.07 K/uL Final   Performed at [facility], [address] [place], [place] [address]     Thyroglobulin Antibody 05/29/2018 <1.0  0.0 - 0.9 IU/mL Final   Comment: (NOTE) Thyroglobulin Antibody measured by [facility] Methodology Performed At: [facility] [place], [place] [id number] Rush Farmer MD [id number]  T4, Total 05/29/2018 11.8  4.5 - 12.0 ug/dL Final   T3 Uptake Ratio 05/29/2018 28  24 - 39 % Final   Free Thyroxine Index 05/29/2018 3.3  1.2 - 4.9 Final   Comment: (NOTE) Performed At: [facility] [place], [place] [id number] Rush Farmer MD [id number]    Thyroglobulin by IMA 05/29/2018 <0.1* 1.5 - 38.5 ng/mL Final   Comment: (NOTE) According to the [facility] of Clinical Biochemistry, the reference interval for Thyroglobulin (TG) should be related to euthyroid patients and not for patients who underwent thyroidectomy. TG reference intervals for these patients depend on the residual mass of the thyroid tissue left after surgery. Establishing a post-operative baseline is recommended. The assay limit of quantitation is 0.1 ng/mL Thyroglobulin measured by [facility] Immunometric Assay Performed At: [facility] [place], [place] [id number] Rush Farmer MD [id number]     Assessment:  Tanya Frey is a 73 y.o. female with stage III papillary carcinoma thyroid carcinoma s/p thyroidectomy in 02/21/2017.  Pathology revealed a 4.6 cm papillary thyroid carcinoma with extrathyroidal extension.  There was angioinvasion but no lymphatic invasion.  Margins were negative.  Pathologic stage was pT4a Nx (stage III).  Soft tissue neck CT on 01/11/2017 revealed a 5 cm complex cystic and solid mass arising from the right lobe of the thyroid compatible with known carcinoma. There was no malignant adenopathy.  She received 130.6 mCi I-131 with Thyrogen stimulation on 04/26/2017.  Whole body I-131 scan on 05/04/2017 revealed uptake at thyroid bed consistent with thyroid  remnant.  There was no scintigraphic evidence of iodine-avid metastatic thyroid cancer.  Thyroid ultrasound on 11/28/2017 revealed no recurrent disease.  She has chronic swallowing issues post surgery.   Bone density on 07/05/2016 revealed osteopenia with a T-score of -1.5 in L1-L4 and -13 in the left femoral neck.  She continued calcium and vitamin D  Symptomatically, she has had chronic issues with swallowing solid foods.  She is hoarse.  Exam is unremarkable.  Plan: 1.   Labs today: CBC, BMP, thyroid profile, and thyroglobulin antibody. 2.   Stage III thyroid carcinoma  Review entire medical history, diagnosis and management of thyroid carcinoma.  Follow-up thyroid function studies.  Schedule thyroid ultrasound.    Continue ongoing surveillance. 3.   Osteopenia  Review bone density from 07/05/2016.  Discuss follow-up every 2 years.  Schedule bone density. 4.   RTC on 11/28/2018 for MD assessment, labs (CBC with diff, CMP, TSH, free T4, thyroglobulin and thyroglobulin antibody), and review of imaging.  I discussed the assessment and treatment plan with the patient.  The patient was provided an opportunity to ask questions and all were answered.  The patient agreed with the plan and demonstrated an understanding of the instructions.  The patient was advised to call back if the symptoms worsen or if the condition fails to improve as anticipated.   Lequita Asal, MD, PhD    07/31/2018, 9:00 AM  I, Selena Batten, am acting as scribe for [employer]. Mike Gip, MD, PhD.  I, Camesha Farooq C. Mike Gip, MD, have reviewed the above documentation for accuracy and completeness, and I agree with the above.

## 2018-07-31 ENCOUNTER — Other Ambulatory Visit: Payer: Self-pay

## 2018-07-31 ENCOUNTER — Inpatient Hospital Stay: Payer: Medicare HMO | Attending: Hematology and Oncology | Admitting: Hematology and Oncology

## 2018-07-31 ENCOUNTER — Ambulatory Visit: Payer: Medicare HMO

## 2018-07-31 ENCOUNTER — Encounter: Payer: Self-pay | Admitting: Hematology and Oncology

## 2018-07-31 VITALS — BP 137/71 | HR 76 | Temp 97.4°F | Ht 66.0 in | Wt 172.1 lb

## 2018-07-31 DIAGNOSIS — Z803 Family history of malignant neoplasm of breast: Secondary | ICD-10-CM | POA: Insufficient documentation

## 2018-07-31 DIAGNOSIS — Z8719 Personal history of other diseases of the digestive system: Secondary | ICD-10-CM | POA: Insufficient documentation

## 2018-07-31 DIAGNOSIS — R490 Dysphonia: Secondary | ICD-10-CM | POA: Insufficient documentation

## 2018-07-31 DIAGNOSIS — E890 Postprocedural hypothyroidism: Secondary | ICD-10-CM | POA: Diagnosis not present

## 2018-07-31 DIAGNOSIS — C73 Malignant neoplasm of thyroid gland: Secondary | ICD-10-CM

## 2018-07-31 DIAGNOSIS — Z87891 Personal history of nicotine dependence: Secondary | ICD-10-CM | POA: Insufficient documentation

## 2018-07-31 DIAGNOSIS — R1310 Dysphagia, unspecified: Secondary | ICD-10-CM

## 2018-07-31 DIAGNOSIS — M8588 Other specified disorders of bone density and structure, other site: Secondary | ICD-10-CM | POA: Insufficient documentation

## 2018-07-31 DIAGNOSIS — Z7289 Other problems related to lifestyle: Secondary | ICD-10-CM | POA: Insufficient documentation

## 2018-07-31 DIAGNOSIS — M8580 Other specified disorders of bone density and structure, unspecified site: Secondary | ICD-10-CM | POA: Insufficient documentation

## 2018-07-31 DIAGNOSIS — Z79899 Other long term (current) drug therapy: Secondary | ICD-10-CM | POA: Insufficient documentation

## 2018-07-31 DIAGNOSIS — Z881 Allergy status to other antibiotic agents status: Secondary | ICD-10-CM | POA: Insufficient documentation

## 2018-07-31 LAB — CBC WITH DIFFERENTIAL/PLATELET
Abs Immature Granulocytes: 0.03 10*3/uL (ref 0.00–0.07)
Basophils Absolute: 0.1 10*3/uL (ref 0.0–0.1)
Basophils Relative: 1 %
Eosinophils Absolute: 0.1 10*3/uL (ref 0.0–0.5)
Eosinophils Relative: 2 %
HCT: 39.2 % (ref 36.0–46.0)
Hemoglobin: 13.2 g/dL (ref 12.0–15.0)
Immature Granulocytes: 1 %
Lymphocytes Relative: 37 %
Lymphs Abs: 1.9 10*3/uL (ref 0.7–4.0)
MCH: 28.4 pg (ref 26.0–34.0)
MCHC: 33.7 g/dL (ref 30.0–36.0)
MCV: 84.5 fL (ref 80.0–100.0)
Monocytes Absolute: 0.4 10*3/uL (ref 0.1–1.0)
Monocytes Relative: 9 %
Neutro Abs: 2.6 10*3/uL (ref 1.7–7.7)
Neutrophils Relative %: 50 %
Platelets: 193 10*3/uL (ref 150–400)
RBC: 4.64 MIL/uL (ref 3.87–5.11)
RDW: 14.9 % (ref 11.5–15.5)
WBC: 5.2 10*3/uL (ref 4.0–10.5)
nRBC: 0 % (ref 0.0–0.2)

## 2018-07-31 LAB — BASIC METABOLIC PANEL
Anion gap: 9 (ref 5–15)
BUN: 14 mg/dL (ref 8–23)
CO2: 26 mmol/L (ref 22–32)
Calcium: 9.3 mg/dL (ref 8.9–10.3)
Chloride: 102 mmol/L (ref 98–111)
Creatinine, Ser: 0.57 mg/dL (ref 0.44–1.00)
GFR calc Af Amer: >60 mL/min (ref >60)
GFR calc non Af Amer: >60 mL/min (ref >60)
Glucose, Bld: 95 mg/dL (ref 70–99)
Potassium: 4.6 mmol/L (ref 3.5–5.1)
Sodium: 137 mmol/L (ref 135–145)

## 2018-07-31 LAB — TSH: TSH: <0.01 u[IU]/mL — ABNORMAL LOW (ref 0.350–4.500)

## 2018-07-31 LAB — T4, FREE: Free T4: 1.67 ng/dL — ABNORMAL HIGH (ref 0.61–1.12)

## 2018-07-31 NOTE — Progress Notes (Signed)
No new changes noted today 

## 2018-08-01 LAB — TGAB+THYROGLOBULIN IMA OR RIA: Thyroglobulin Antibody: <1 IU/mL (ref 0.0–0.9)

## 2018-08-01 LAB — THYROID PANEL
Free Thyroxine Index: 3.6 (ref 1.2–4.9)
T3 Uptake Ratio: 30 % (ref 24–39)
T4, Total: 12.1 ug/dL — ABNORMAL HIGH (ref 4.5–12.0)

## 2018-08-01 LAB — THYROGLOBULIN BY IMA: Thyroglobulin by IMA: <0.1 ng/mL — ABNORMAL LOW (ref 1.5–38.5)

## 2018-08-06 ENCOUNTER — Other Ambulatory Visit: Payer: Self-pay

## 2018-08-06 ENCOUNTER — Other Ambulatory Visit: Payer: Self-pay | Admitting: Hematology and Oncology

## 2018-08-06 ENCOUNTER — Telehealth: Payer: Self-pay | Admitting: Hematology and Oncology

## 2018-08-06 ENCOUNTER — Ambulatory Visit
Admission: RE | Admit: 2018-08-06 | Discharge: 2018-08-06 | Disposition: A | Discharge status: Home / Self Care | Payer: Medicare HMO | Source: Ambulatory Visit | Attending: Hematology and Oncology | Admitting: Hematology and Oncology

## 2018-08-06 DIAGNOSIS — E890 Postprocedural hypothyroidism: Secondary | ICD-10-CM

## 2018-08-06 DIAGNOSIS — C73 Malignant neoplasm of thyroid gland: Secondary | ICD-10-CM | POA: Diagnosis not present

## 2018-08-06 DIAGNOSIS — M542 Cervicalgia: Secondary | ICD-10-CM | POA: Diagnosis not present

## 2018-08-06 MED ORDER — LEVOTHYROXINE SODIUM 137 MCG PO TABS: 150.0000 ug | ORAL_TABLET | Freq: Every day | ORAL | 0 refills | Status: DC

## 2018-08-06 NOTE — Telephone Encounter (Signed)
Re:  Thyroid ultrasound and TSH/free T4  Called patient to review normal thyroid ultrasound today without recurrence.  TSH was < 0.01 and free T4 was 1.67 (0.61-1.12).  Patient currently taking Synthroid 150 mcg a day.  New Rx:  Synthroid 137 mcg po q day.  Dis: 90 day supply.  Repeat thyroid function studies in 3 months to determine if further adjustments are needed.   Lequita Asal, MD

## 2018-08-15 ENCOUNTER — Ambulatory Visit
Admission: RE | Admit: 2018-08-15 | Discharge: 2018-08-15 | Disposition: A | Discharge status: Home / Self Care | Payer: Medicare HMO | Source: Ambulatory Visit | Attending: Hematology and Oncology | Admitting: Hematology and Oncology

## 2018-08-15 ENCOUNTER — Other Ambulatory Visit: Payer: Self-pay

## 2018-08-15 DIAGNOSIS — M8588 Other specified disorders of bone density and structure, other site: Secondary | ICD-10-CM | POA: Insufficient documentation

## 2018-08-15 DIAGNOSIS — M8589 Other specified disorders of bone density and structure, multiple sites: Secondary | ICD-10-CM | POA: Diagnosis not present

## 2018-08-20 ENCOUNTER — Telehealth: Payer: Self-pay

## 2018-08-20 NOTE — Telephone Encounter (Signed)
-----   Message from Lequita Asal, MD sent at 08/20/2018 12:57 PM EDT ----- Regarding: RE: Scan Results  Please call patient.  Bone density- mild osteopenia (T score -1.1) improved from prior bone density.  Thyroid ultrasound- no residual disease.  M ----- Message ----- From: Kelly Splinter Sent: 08/20/2018  11:30 AM EDT To: Arlan Organ, RN, # Subject: Scan Results                                   Mrs. Mcfaul is requesting the results of her scan on 7/8.   Thanks, [employer]

## 2018-08-20 NOTE — Telephone Encounter (Signed)
Informed patient of Bone Density and Thyroid san results. Patient verbalizes understanding and denies any further questions or concerns.

## 2018-08-28 ENCOUNTER — Encounter: Payer: Self-pay | Admitting: General Surgery

## 2018-09-04 ENCOUNTER — Other Ambulatory Visit: Payer: Self-pay | Admitting: Hematology and Oncology

## 2018-09-04 DIAGNOSIS — C73 Malignant neoplasm of thyroid gland: Secondary | ICD-10-CM

## 2018-10-02 ENCOUNTER — Other Ambulatory Visit: Payer: Self-pay

## 2018-10-02 DIAGNOSIS — Z1231 Encounter for screening mammogram for malignant neoplasm of breast: Secondary | ICD-10-CM

## 2018-10-30 ENCOUNTER — Other Ambulatory Visit: Payer: Self-pay

## 2018-10-31 ENCOUNTER — Inpatient Hospital Stay: Payer: Medicare HMO | Attending: Hematology and Oncology

## 2018-10-31 DIAGNOSIS — C73 Malignant neoplasm of thyroid gland: Secondary | ICD-10-CM

## 2018-10-31 LAB — TSH: TSH: 0.023 u[IU]/mL — ABNORMAL LOW (ref 0.350–4.500)

## 2018-10-31 LAB — T4, FREE: Free T4: 1.43 ng/dL — ABNORMAL HIGH (ref 0.61–1.12)

## 2018-11-26 NOTE — Progress Notes (Signed)
Confirmed Name, DOB, and Address. Denies any concerns.  

## 2018-11-27 NOTE — Progress Notes (Signed)
[facility]  [address] [place],  [address] Phone: [phone]  Fax: [phone]   Clinic Day:  11/28/2018  Referring physician: [name], MD  Chief Complaint: Tanya Frey is a 73 y.o. female with stage III papillary carcinoma thyroid s/p radioactive iodine who is seen for assessment and review of recent studies  HPI:  The patient was last seen in the medical oncology clinic on 07/31/2018. At that time, she had chronic issues with swallowing solid foods.  She was hoarse.  Exam was unremarkable.  Thyroglobulin and anti-thyroglobulin antibodies were < 1.0.  TSH was < 0.01 and free T4 was 1.67 (0.61-1.12).  Synthroid was decreased from 150 mcg/day to 137 mcg/day on 08/06/2018.  Thyroid ultrasound on 08/06/2018 revealed no residual or recurrent tissue post thyroidectomy.  Bone density on 08/15/2018 revealed osteopenia in the left femur neck with a T-score of -1.1 in the left femoral neck.  Labs on 10/31/2018 revealed a TSH of 0.023 and a free T4 of 1.43.  During the interim, she has felt "fine".  She is taking calcium and vitamin D.  She is eating slowly and chewing chewing her food well.  Sometimes, she still gets a "little choked".  Her voice tends to "come and go".   Past Medical History:  Diagnosis Date  . Anemia   . Difficult intubation   . Diffuse cystic mastopathy   . Family history of malignant neoplasm of breast   . Hyperlipidemia   . Obesity, unspecified   . Personal history of tobacco use, presenting hazards to health   . Thyroid cancer (HCC)    T4a, NX: 4.6 cm lesion with extrathyroidal extension. Received I 131 post procedure.    . Varicose veins     Past Surgical History:  Procedure Laterality Date  . BREAST EXCISIONAL BIOPSY Bilateral 1989   neg  . COLONOSCOPY  2011   Dr. [name]; colon polyps (tubular adenoma)  . COLONOSCOPY N/A   . COLONOSCOPY WITH PROPOFOL N/A 10/10/2014   Procedure: COLONOSCOPY  WITH PROPOFOL;  Surgeon: [name], MD;  Location: [facility] ENDOSCOPY;  Service: Endoscopy;  Laterality: N/A;  . THYROID SURGERY Bilateral   . THYROIDECTOMY N/A 02/21/2017   Procedure: THYROIDECTOMY;  Surgeon: [name], MD;  Location: [facility] ORS;  Service: ENT;  Laterality: N/A;  . TUBAL LIGATION    . VARICOSE VEIN SURGERY    . vein closure procedure Right 2009    Family History  Problem Relation Age of Onset  . Breast cancer Daughter 48       [name] BRCA negative    Social History:  reports that she quit smoking about 28 years ago. She has a 10.00 pack-year smoking history. She has never used smokeless tobacco. She reports current alcohol use of about 1.0 - 4.0 standard drinks of alcohol per week. She reports that she does not use drugs.  She is retired from [employer]. She lives in [place].  The patient is alone today.   Allergies:  Allergies  Allergen Reactions  . Cefdinir Rash  . Protonix [Pantoprazole Sodium] Anaphylaxis and Other (See Comments)    Current Medications: Current Outpatient Medications  Medication Sig Dispense Refill  . aspirin EC 81 MG tablet Take 81 mg by mouth daily.    Marland Kitchen levothyroxine (SYNTHROID) 137 MCG tablet TAKE 1 TABLET(137 MCG) BY MOUTH DAILY BEFORE BREAKFAST 90 tablet 0  . simvastatin (ZOCOR) 40 MG tablet Take 40 mg by mouth every evening.  No current facility-administered medications for this visit.     Review of Systems  Constitutional: Positive for weight loss (2 pounds). Negative for chills, diaphoresis, fever and malaise/fatigue.       Feels "fine".  HENT: Negative for congestion, ear pain, nosebleeds, sinus pain, sore throat and tinnitus.        Voice "comes and goes".   Eyes: Negative.  Negative for blurred vision and double vision.  Respiratory: Negative.  Negative for cough, hemoptysis, sputum production, shortness of breath and wheezing.   Cardiovascular: Negative.  Negative for chest pain, palpitations,  orthopnea and leg swelling.  Gastrointestinal: Negative for abdominal pain, blood in stool, constipation, diarrhea, heartburn, melena, nausea and vomiting.       Trouble swallowing solids.  Chokes easily.  Eats slowly and chews well.  Genitourinary: Negative.  Negative for dysuria, frequency and urgency.  Musculoskeletal: Negative.  Negative for back pain, joint pain and myalgias.  Skin: Negative.  Negative for itching and rash.  Neurological: Negative.  Negative for dizziness, tingling, sensory change, speech change, focal weakness, weakness and headaches.  Endo/Heme/Allergies: Negative.  Does not bruise/bleed easily.  Psychiatric/Behavioral: Negative.  Negative for depression. The patient is not nervous/anxious and does not have insomnia.   All other systems reviewed and are negative.   Performance status (ECOG): 1  Vitals: Blood pressure 139/62, pulse 60, temperature 98.1 F (36.7 C), temperature source Tympanic, resp. rate 18, height '5\' 6"'  (1.676 m), weight 170 lb 10.2 oz (77.4 kg), SpO2 100 %.   Physical Exam  Constitutional: She is oriented to person, place, and time. She appears well-developed and well-nourished. No distress.  HENT:  Head: Normocephalic and atraumatic.  Mouth/Throat: Oropharynx is clear and moist. No oropharyngeal exudate.  Styled gray. Mask.  Voice, improved.  Eyes: Pupils are equal, round, and reactive to light. Conjunctivae and EOM are normal. No scleral icterus.  Neck: Normal range of motion. Neck supple. No JVD present.  Thyroidectomy scar noted.  Cardiovascular: Normal rate, regular rhythm and normal heart sounds.  No murmur heard. Pulmonary/Chest: Effort normal and breath sounds normal. No respiratory distress. She has no wheezes. She has no rales.  Abdominal: Soft. Bowel sounds are normal. She exhibits no distension and no mass. There is no abdominal tenderness. There is no rebound and no guarding.  Musculoskeletal: Normal range of motion.         General: No edema.  Lymphadenopathy:       Head (right side): No preauricular, no posterior auricular and no occipital adenopathy present.       Head (left side): No preauricular, no posterior auricular and no occipital adenopathy present.    She has no cervical adenopathy.    She has no axillary adenopathy.       Right: No inguinal and no supraclavicular adenopathy present.       Left: No inguinal and no supraclavicular adenopathy present.  Neurological: She is alert and oriented to person, place, and time. She has normal reflexes.  Skin: Skin is warm and dry. No rash noted. She is not diaphoretic. No erythema. No pallor.  Psychiatric: She has a normal mood and affect. Her behavior is normal. Judgment and thought content normal.  Nursing note and vitals reviewed.   Appointment on 11/28/2018  Component Date Value Ref Range Status  . Sodium 11/28/2018 135  135 - 145 mmol/L Final  . Potassium 11/28/2018 4.9  3.5 - 5.1 mmol/L Final  . Chloride 11/28/2018 99  98 - 111 mmol/L Final  .  CO2 11/28/2018 27  22 - 32 mmol/L Final  . Glucose, Bld 11/28/2018 97  70 - 99 mg/dL Final  . BUN 11/28/2018 9  8 - 23 mg/dL Final  . Creatinine, Ser 11/28/2018 0.62  0.44 - 1.00 mg/dL Final  . Calcium 11/28/2018 9.1  8.9 - 10.3 mg/dL Final  . Total Protein 11/28/2018 7.3  6.5 - 8.1 g/dL Final  . Albumin 11/28/2018 4.1  3.5 - 5.0 g/dL Final  . AST 11/28/2018 16  15 - 41 U/L Final  . ALT 11/28/2018 16  0 - 44 U/L Final  . Alkaline Phosphatase 11/28/2018 50  38 - 126 U/L Final  . Total Bilirubin 11/28/2018 0.2* 0.3 - 1.2 mg/dL Final  . GFR calc non Af Amer 11/28/2018 >60  >60 mL/min Final  . GFR calc Af Amer 11/28/2018 >60  >60 mL/min Final  . Anion gap 11/28/2018 9  5 - 15 Final   Performed at [facility], [address] [place], [place] [address]  . WBC 11/28/2018 3.6* 4.0 - 10.5 K/uL Final  . RBC 11/28/2018 4.58  3.87 - 5.11 MIL/uL Final  . Hemoglobin 11/28/2018 13.2  12.0 - 15.0 g/dL  Final  . HCT 11/28/2018 39.7  36.0 - 46.0 % Final  . MCV 11/28/2018 86.7  80.0 - 100.0 fL Final  . MCH 11/28/2018 28.8  26.0 - 34.0 pg Final  . MCHC 11/28/2018 33.2  30.0 - 36.0 g/dL Final  . RDW 11/28/2018 14.6  11.5 - 15.5 % Final  . Platelets 11/28/2018 199  150 - 400 K/uL Final  . nRBC 11/28/2018 0.0  0.0 - 0.2 % Final  . Neutrophils Relative % 11/28/2018 42  % Final  . Neutro Abs 11/28/2018 1.5* 1.7 - 7.7 K/uL Final  . Lymphocytes Relative 11/28/2018 45  % Final  . Lymphs Abs 11/28/2018 1.7  0.7 - 4.0 K/uL Final  . Monocytes Relative 11/28/2018 9  % Final  . Monocytes Absolute 11/28/2018 0.3  0.1 - 1.0 K/uL Final  . Eosinophils Relative 11/28/2018 2  % Final  . Eosinophils Absolute 11/28/2018 0.1  0.0 - 0.5 K/uL Final  . Basophils Relative 11/28/2018 1  % Final  . Basophils Absolute 11/28/2018 0.0  0.0 - 0.1 K/uL Final  . Immature Granulocytes 11/28/2018 1  % Final  . Abs Immature Granulocytes 11/28/2018 0.02  0.00 - 0.07 K/uL Final   Performed at [facility], [address] [place],  [address]    Assessment:  Tanya Frey is a 73 y.o. female with stage III papillary carcinoma thyroid carcinoma s/p thyroidectomy in 02/21/2017.  Pathology revealed a 4.6 cm papillary thyroid carcinoma with extrathyroidal extension.  There was angioinvasion but no lymphatic invasion.  Margins were negative.  Pathologic stage was pT4a Nx (stage III).  Soft tissue neck CT on 01/11/2017 revealed a 5 cm complex cystic and solid mass arising from the right lobe of the thyroid compatible with known carcinoma. There was no malignant adenopathy.  She received 130.6 mCi I-131 with Thyrogen stimulation on 04/26/2017.  Whole body I-131 scan on 05/04/2017 revealed uptake at thyroid bed consistent with thyroid remnant.  There was no scintigraphic evidence of iodine-avid metastatic thyroid cancer.  Thyroid ultrasound on 08/06/2018 revealed no residual or recurrent tissue post thyroidectomy.   She has chronic swallowing issues post surgery.   Bone density on 07/05/2016 revealed osteopenia with a T-score of -1.5 in L1-L4 and -13 in the left femoral neck.  Bone density  on 08/15/2018 revealed osteopenia in the left femur neck with a T-score of -1.1 in the left femoral neck.  She continued calcium and vitamin D  Symptomatically, she is doing well.  She has lost 2 pounds.  Exam is stable.  Plan: 1.   Labs today:  CBC with diff, CMP, TSH, free T4, thyroglobulin, and anti-thyroglobulin antibody. 2.   Stage III thyroid carcinoma  Patient is s/p thyroidectomy 02/2017.  Patient is s/p I-131.  Interval thyroid ultrasound reveals no evidence of recurrent or persistent disease.  Wait thyroid function studies. 3.   Osteopenia  Review interval bone density study.  She has mild osteopenia (-score -1.1)  Continue calcium and vitamin D. 4.   RTC in 6 months for MD assessment and labs (CBC with diff, CMP, TSH, free T4, thyroglobulin and antithyroglobulin antibodies).  Addendum:  TSH was 0.011 (0.35 - 4.5), free T4 1.50 (0.61 - 1.12).  Thyroglobulin and thyroglobulin antibodies were negative.  Patient will be called for slight adjustment in Synthroid.  I discussed the assessment and treatment plan with the patient.  The patient was provided an opportunity to ask questions and all were answered.  The patient agreed with the plan and demonstrated an understanding of the instructions.  The patient was advised to call back if the symptoms worsen or if the condition fails to improve as anticipated.     Lequita Asal, MD, PhD    11/28/2018, 11:23 AM

## 2018-11-28 ENCOUNTER — Other Ambulatory Visit: Payer: Self-pay

## 2018-11-28 ENCOUNTER — Encounter: Payer: Self-pay | Admitting: Hematology and Oncology

## 2018-11-28 ENCOUNTER — Inpatient Hospital Stay: Payer: Medicare HMO | Attending: Hematology and Oncology | Admitting: Hematology and Oncology

## 2018-11-28 ENCOUNTER — Inpatient Hospital Stay: Payer: Medicare HMO

## 2018-11-28 VITALS — BP 139/62 | HR 60 | Temp 98.1°F | Resp 18 | Ht 66.0 in | Wt 170.6 lb

## 2018-11-28 DIAGNOSIS — R0989 Other specified symptoms and signs involving the circulatory and respiratory systems: Secondary | ICD-10-CM | POA: Diagnosis not present

## 2018-11-28 DIAGNOSIS — C73 Malignant neoplasm of thyroid gland: Secondary | ICD-10-CM | POA: Diagnosis not present

## 2018-11-28 DIAGNOSIS — E890 Postprocedural hypothyroidism: Secondary | ICD-10-CM | POA: Insufficient documentation

## 2018-11-28 DIAGNOSIS — E785 Hyperlipidemia, unspecified: Secondary | ICD-10-CM | POA: Insufficient documentation

## 2018-11-28 DIAGNOSIS — M8580 Other specified disorders of bone density and structure, unspecified site: Secondary | ICD-10-CM | POA: Insufficient documentation

## 2018-11-28 DIAGNOSIS — M8588 Other specified disorders of bone density and structure, other site: Secondary | ICD-10-CM | POA: Diagnosis not present

## 2018-11-28 DIAGNOSIS — Z7982 Long term (current) use of aspirin: Secondary | ICD-10-CM | POA: Diagnosis not present

## 2018-11-28 DIAGNOSIS — Z87891 Personal history of nicotine dependence: Secondary | ICD-10-CM | POA: Insufficient documentation

## 2018-11-28 DIAGNOSIS — R490 Dysphonia: Secondary | ICD-10-CM | POA: Diagnosis not present

## 2018-11-28 DIAGNOSIS — R634 Abnormal weight loss: Secondary | ICD-10-CM | POA: Insufficient documentation

## 2018-11-28 DIAGNOSIS — Z79899 Other long term (current) drug therapy: Secondary | ICD-10-CM | POA: Insufficient documentation

## 2018-11-28 DIAGNOSIS — Z803 Family history of malignant neoplasm of breast: Secondary | ICD-10-CM | POA: Diagnosis not present

## 2018-11-28 LAB — CBC WITH DIFFERENTIAL/PLATELET
Abs Immature Granulocytes: 0.02 10*3/uL (ref 0.00–0.07)
Basophils Absolute: 0 10*3/uL (ref 0.0–0.1)
Basophils Relative: 1 %
Eosinophils Absolute: 0.1 10*3/uL (ref 0.0–0.5)
Eosinophils Relative: 2 %
HCT: 39.7 % (ref 36.0–46.0)
Hemoglobin: 13.2 g/dL (ref 12.0–15.0)
Immature Granulocytes: 1 %
Lymphocytes Relative: 45 %
Lymphs Abs: 1.7 10*3/uL (ref 0.7–4.0)
MCH: 28.8 pg (ref 26.0–34.0)
MCHC: 33.2 g/dL (ref 30.0–36.0)
MCV: 86.7 fL (ref 80.0–100.0)
Monocytes Absolute: 0.3 10*3/uL (ref 0.1–1.0)
Monocytes Relative: 9 %
Neutro Abs: 1.5 10*3/uL — ABNORMAL LOW (ref 1.7–7.7)
Neutrophils Relative %: 42 %
Platelets: 199 10*3/uL (ref 150–400)
RBC: 4.58 MIL/uL (ref 3.87–5.11)
RDW: 14.6 % (ref 11.5–15.5)
WBC: 3.6 10*3/uL — ABNORMAL LOW (ref 4.0–10.5)
nRBC: 0 % (ref 0.0–0.2)

## 2018-11-28 LAB — COMPREHENSIVE METABOLIC PANEL
ALT: 16 U/L (ref 0–44)
AST: 16 U/L (ref 15–41)
Albumin: 4.1 g/dL (ref 3.5–5.0)
Alkaline Phosphatase: 50 U/L (ref 38–126)
Anion gap: 9 (ref 5–15)
BUN: 9 mg/dL (ref 8–23)
CO2: 27 mmol/L (ref 22–32)
Calcium: 9.1 mg/dL (ref 8.9–10.3)
Chloride: 99 mmol/L (ref 98–111)
Creatinine, Ser: 0.62 mg/dL (ref 0.44–1.00)
GFR calc Af Amer: >60 mL/min (ref >60)
GFR calc non Af Amer: >60 mL/min (ref >60)
Glucose, Bld: 97 mg/dL (ref 70–99)
Potassium: 4.9 mmol/L (ref 3.5–5.1)
Sodium: 135 mmol/L (ref 135–145)
Total Bilirubin: 0.2 mg/dL — ABNORMAL LOW (ref 0.3–1.2)
Total Protein: 7.3 g/dL (ref 6.5–8.1)

## 2018-11-28 LAB — T4, FREE: Free T4: 1.5 ng/dL — ABNORMAL HIGH (ref 0.61–1.12)

## 2018-11-28 LAB — TSH: TSH: 0.011 u[IU]/mL — ABNORMAL LOW (ref 0.350–4.500)

## 2018-11-29 ENCOUNTER — Ambulatory Visit
Admission: RE | Admit: 2018-11-29 | Discharge: 2018-11-29 | Disposition: A | Discharge status: Home / Self Care | Payer: Medicare HMO | Source: Ambulatory Visit | Attending: Surgery | Admitting: Surgery

## 2018-11-29 DIAGNOSIS — Z1231 Encounter for screening mammogram for malignant neoplasm of breast: Secondary | ICD-10-CM

## 2018-11-29 LAB — THYROGLOBULIN BY IMA: Thyroglobulin by IMA: <0.1 ng/mL — ABNORMAL LOW (ref 1.5–38.5)

## 2018-11-29 LAB — TGAB+THYROGLOBULIN IMA OR RIA: Thyroglobulin Antibody: <1 IU/mL (ref 0.0–0.9)

## 2018-12-05 ENCOUNTER — Encounter: Payer: Self-pay | Admitting: Surgery

## 2018-12-05 ENCOUNTER — Other Ambulatory Visit: Payer: Self-pay

## 2018-12-05 ENCOUNTER — Ambulatory Visit: Payer: Medicare HMO | Admitting: Surgery

## 2018-12-05 VITALS — BP 125/77 | HR 69 | Temp 97.7°F | Resp 14 | Ht 65.0 in | Wt 170.0 lb

## 2018-12-05 DIAGNOSIS — Z1231 Encounter for screening mammogram for malignant neoplasm of breast: Secondary | ICD-10-CM

## 2018-12-05 NOTE — Patient Instructions (Addendum)
Patient will be asked to return to the office in one year with a bilateral screening mammogram.  Continue self breast exams. Call office for any new breast issues or concerns.  

## 2018-12-10 ENCOUNTER — Encounter: Payer: Self-pay | Admitting: Surgery

## 2018-12-10 NOTE — Progress Notes (Signed)
Outpatient Surgical Follow Up  12/10/2018  Tanya Frey is an 73 y.o. female.   Chief Complaint  Patient presents with  . Follow-up    HPI: Tanya Frey is a 73 year old female here for follow-up for her breast exam mammogram.  She has had bilateral breast biopsies more than 30 years ago with benign pathology.  She denies any new breast issues.  No pain, no masses no lymphadenopathy.  She denies any fevers any chills any weight loss. She does have a history of thyroid cancer s/p thyroidectomy 2019  Mammogram personally reviewed showing no evidence of concerning lesion.  Past Medical History:  Diagnosis Date  . Anemia   . Difficult intubation   . Diffuse cystic mastopathy   . Family history of malignant neoplasm of breast   . Hyperlipidemia   . Obesity, unspecified   . Personal history of tobacco use, presenting hazards to health   . Thyroid cancer (HCC)    T4a, NX: 4.6 cm lesion with extrathyroidal extension. Received I 131 post procedure.    . Varicose veins     Past Surgical History:  Procedure Laterality Date  . BREAST EXCISIONAL BIOPSY Bilateral 1989   neg  . COLONOSCOPY  2011   Dr. [name]; colon polyps (tubular adenoma)  . COLONOSCOPY N/A   . COLONOSCOPY WITH PROPOFOL N/A 10/10/2014   Procedure: COLONOSCOPY WITH PROPOFOL;  Surgeon: [name], MD;  Location: [facility] ENDOSCOPY;  Service: Endoscopy;  Laterality: N/A;  . THYROID SURGERY Bilateral   . THYROIDECTOMY N/A 02/21/2017   Procedure: THYROIDECTOMY;  Surgeon: [name], MD;  Location: [facility] ORS;  Service: ENT;  Laterality: N/A;  . TUBAL LIGATION    . VARICOSE VEIN SURGERY    . vein closure procedure Right 2009    Family History  Problem Relation Age of Onset  . Breast cancer Daughter 6       [name] BRCA negative    Social History:  reports that she quit smoking about 28 years ago. She has a 10.00 pack-year smoking history. She has never used smokeless tobacco. She reports current alcohol  use of about 1.0 - 4.0 standard drinks of alcohol per week. She reports that she does not use drugs.  Allergies:  Allergies  Allergen Reactions  . Cefdinir Rash  . Protonix [Pantoprazole Sodium] Anaphylaxis and Other (See Comments)    Medications reviewed.    ROS Full ROS performed and is otherwise negative other than what is stated in HPI   BP 125/77   Pulse 69   Temp 97.7 F (36.5 C)   Resp 14   Ht '5\' 5"'  (1.651 m)   Wt 170 lb (77.1 kg)   SpO2 97%   BMI 28.29 kg/m   Physical Exam Vitals signs and nursing note reviewed. Exam conducted with a chaperone present.  Constitutional:      General: She is not in acute distress.    Appearance: Normal appearance. She is normal weight.  Eyes:     General: No scleral icterus.       Right eye: No discharge.        Left eye: No discharge.  Neck:     Musculoskeletal: Normal range of motion. No neck rigidity.  Cardiovascular:     Rate and Rhythm: Normal rate and regular rhythm.     Heart sounds: No murmur.  Pulmonary:     Effort: Pulmonary effort is normal. No respiratory distress.     Breath sounds: No stridor. No wheezing.  Comments: BREAST: Bilateral postlumpectomy scars.  No new concerning masses within breast.  No evidence of lymphadenopathy.  No nipple or skin lesions Abdominal:     General: Abdomen is flat. There is no distension.     Palpations: There is no mass.     Tenderness: There is no abdominal tenderness. There is no guarding or rebound.     Hernia: No hernia is present.  Musculoskeletal: Normal range of motion.  Lymphadenopathy:     Cervical: No cervical adenopathy.  Skin:    General: Skin is warm and dry.     Capillary Refill: Capillary refill takes less than 2 seconds.  Neurological:     General: No focal deficit present.     Mental Status: She is alert and oriented to person, place, and time.  Psychiatric:        Mood and Affect: Mood normal.        Thought Content: Thought content normal.         Judgment: Judgment normal.    Assessment/Plan: 73 year old female with prior bilateral breast pathology now following up with an annual exam mammogram.  No concerning lesions we will see her next year with another mammogram   Greater than 50% of the 25 minutes  visit was spent in counseling/coordination of care   Caroleen Hamman, MD [facility]

## 2018-12-17 IMAGING — NM NM [ID] THYROID CANCER METS SP CA TX
2 series · 6 of 6 positions shown · non-contrast
Comparison: None.

CLINICAL DATA: Thyroid cancer post thyroidectomy and postoperative
radioactive iodine ablation

EXAM:
NUCLEAR MEDICINE N-QBQ POST THERAPY WHOLE BODY SCAN
TECHNIQUE: The patient received 130.6 mCi N-QBQ sodium iodide for the treatment
of thyroid cancer within the past 10 days. The patient returns
today, and whole body scanning was performed in the anterior and
posterior projections.

[Series 1000: (id) statics · 2.40mm/px · 2 acquisitions, 4 frames shown]
[im 1/2]
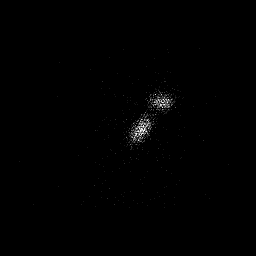
[im 1/2  full-range]
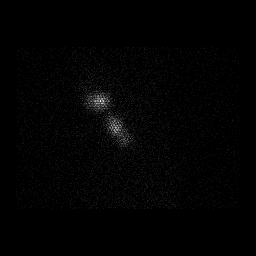
[im 2/2  full-range]
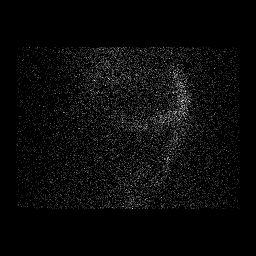
[im 2/2  full-range]
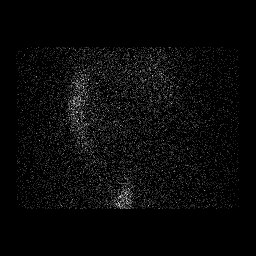

[Series 1000: 8 days post therapy (id) wb scan · 2.40mm/px · 2 of 2 frames shown]
[frame 1/2]
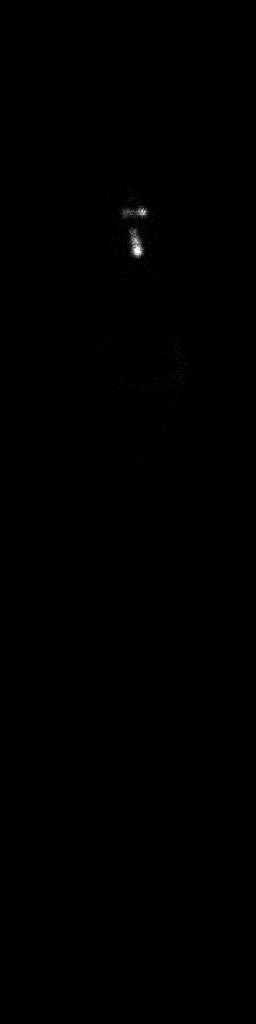
[frame 2/2]
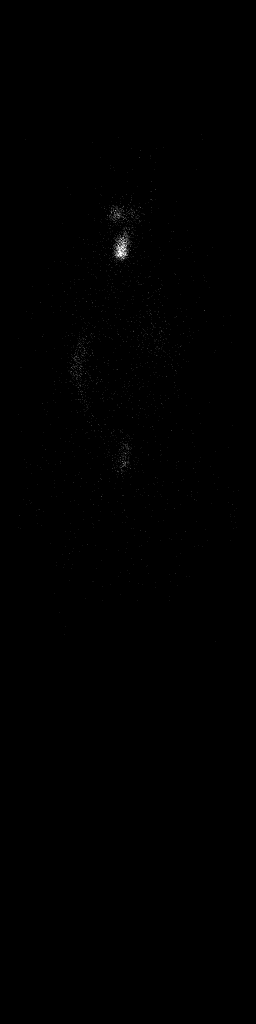

[6 of 6 positions shown; findings below may reference images not displayed]

FINDINGS: Uptake of tracer at the thyroid bed consistent with thyroid remnant.

Additional uptake at the inferior head likely salivary.

GI excretion of tracer within colon.

No definite sites of radio iodine accumulation are identified
suspicious for distant iodine-avid metastases.
IMPRESSION: Uptake at thyroid bed consistent with thyroid remnant.

No scintigraphic evidence of iodine-avid metastatic thyroid cancer.

## 2019-01-25 ENCOUNTER — Other Ambulatory Visit: Payer: Self-pay | Admitting: Hematology and Oncology

## 2019-01-25 DIAGNOSIS — C73 Malignant neoplasm of thyroid gland: Secondary | ICD-10-CM

## 2019-04-12 ENCOUNTER — Other Ambulatory Visit: Payer: Self-pay | Admitting: Oncology

## 2019-04-12 DIAGNOSIS — C73 Malignant neoplasm of thyroid gland: Secondary | ICD-10-CM

## 2019-05-27 ENCOUNTER — Other Ambulatory Visit: Payer: Self-pay

## 2019-05-27 NOTE — Progress Notes (Signed)
No new changes noted today. The patient Name and DOB has been verified by phone today. 

## 2019-05-27 NOTE — Progress Notes (Signed)
[facility]  [address], [address] [place], [place] [address] Phone: [phone]  Fax: [phone]   Clinic Day:  05/29/2019  Referring physician: [name], MD  Chief Complaint: Tanya Frey is a 74 y.o. female with stage III papillary carcinoma thyroid s/p radioactive iodine who is seen for 6 month assessment.   HPI:  The patient was last seen in the medical oncology clinic on 11/28/2018. At that time, she was doing well. She had lost 2 pounds. Exam was stable. Hematocrit was 39.7, hemoglobin 13.2, MCV 86.7, platelets 199,000, WBC 3600, ANC 1500. CMP was normal. TSH was 0.011 (low). Free T4 was 1.50 (0.61 - 1.12). Thyroglobulin was <1.0 and thyroglobulin antibody was < 0.1.   Bilateral mammogram on 11/29/2018 revealed no mammographic evidence of malignancy.   During the interim, she is doing well today. She has lost 4 lb and notes she has been watching her diet. She denies any shortness of breath and palpitations. She notes her voice has been doing better and she is able to talk somewhat normally now; she sometimes has issues due to her sinus drainage. She has to eat slowly and chew on her food a lot before she is able to swallow. She denies any choking swells. She is currently taking Synthroid 137 mcg a day.    Past Medical History:  Diagnosis Date  . Anemia   . Difficult intubation   . Diffuse cystic mastopathy   . Family history of malignant neoplasm of breast   . Hyperlipidemia   . Obesity, unspecified   . Personal history of tobacco use, presenting hazards to health   . Thyroid cancer (HCC)    T4a, NX: 4.6 cm lesion with extrathyroidal extension. Received I 131 post procedure.    . Varicose veins     Past Surgical History:  Procedure Laterality Date  . BREAST EXCISIONAL BIOPSY Bilateral 1989   neg  . COLONOSCOPY  2011   Dr. [name]; colon polyps (tubular adenoma)  . COLONOSCOPY N/A   . COLONOSCOPY WITH PROPOFOL N/A 10/10/2014    Procedure: COLONOSCOPY WITH PROPOFOL;  Surgeon: [name], MD;  Location: [facility] ENDOSCOPY;  Service: Endoscopy;  Laterality: N/A;  . THYROID SURGERY Bilateral   . THYROIDECTOMY N/A 02/21/2017   Procedure: THYROIDECTOMY;  Surgeon: [name], MD;  Location: [facility] ORS;  Service: ENT;  Laterality: N/A;  . TUBAL LIGATION    . VARICOSE VEIN SURGERY    . vein closure procedure Right 2009    Family History  Problem Relation Age of Onset  . Breast cancer Daughter 36       [name] BRCA negative    Social History:  reports that she quit smoking about 29 years ago. She has a 10.00 pack-year smoking history. She has never used smokeless tobacco. She reports current alcohol use of about 1.0 - 4.0 standard drinks of alcohol per week. She reports that she does not use drugs.  She is retired from [employer]. She lives in [place].  The patient is alone  today.   Allergies:  Allergies  Allergen Reactions  . Cefdinir Rash  . Protonix [Pantoprazole Sodium] Anaphylaxis and Other (See Comments)    Current Medications: Current Outpatient Medications  Medication Sig Dispense Refill  . aspirin EC 81 MG tablet Take 81 mg by mouth daily.    . Calcium-Magnesium-Vitamin D (CALCIUM 1200+D3 PO) Take by mouth.    . levothyroxine (SYNTHROID) 137 MCG tablet TAKE 1 TABLET(137 MCG) BY MOUTH  DAILY BEFORE BREAKFAST 90 tablet 0  . simvastatin (ZOCOR) 40 MG tablet Take 40 mg by mouth every evening.      No current facility-administered medications for this visit.    Review of Systems  Constitutional: Positive for weight loss (4 pounds). Negative for chills, diaphoresis, fever and malaise/fatigue.       Feels "fine".  HENT: Negative for congestion, ear pain, nosebleeds, sinus pain, sore throat and tinnitus.        Voice "better".   Eyes: Negative.  Negative for blurred vision and double vision.  Respiratory: Negative.  Negative for cough, hemoptysis, sputum production, shortness of breath  and wheezing.   Cardiovascular: Negative.  Negative for chest pain, palpitations, orthopnea and leg swelling.  Gastrointestinal: Negative for abdominal pain, blood in stool, constipation, diarrhea, heartburn, melena, nausea and vomiting.       Trouble swallowing solids. Eats slowly and chews well.  No interval choking spells.  Genitourinary: Negative.  Negative for dysuria, frequency and urgency.  Musculoskeletal: Negative.  Negative for back pain, joint pain and myalgias.  Skin: Negative.  Negative for itching and rash.  Neurological: Negative.  Negative for dizziness, tingling, sensory change, speech change, focal weakness, weakness and headaches.  Endo/Heme/Allergies: Negative.  Does not bruise/bleed easily.  Psychiatric/Behavioral: Negative.  Negative for depression. The patient is not nervous/anxious and does not have insomnia.   All other systems reviewed and are negative.   Performance status (ECOG): 1 - Symptomatic but completely ambulatory  Vitals: Blood pressure 140/67, pulse (!) 59, temperature 97.9 F (36.6 C), temperature source Tympanic, resp. rate 18, height _0  (1.651 m), weight 166 lb 0.1 oz (75.3 kg), SpO2 100 %.   Physical Exam Vitals and nursing note reviewed.  Constitutional:      General: She is not in acute distress.    Appearance: She is well-developed and well-nourished. She is not diaphoretic.  HENT:     Head: Normocephalic and atraumatic.     Mouth/Throat:     Mouth: Oropharynx is clear and moist.     Pharynx: No oropharyngeal exudate.      Comments: Styled gray. Mask.  Voice, improved. Eyes:     General: No scleral icterus.       Right eye: No discharge.        Left eye: No discharge.     Extraocular Movements: EOM normal.     Conjunctiva/sclera: Conjunctivae normal.     Pupils: Pupils are equal, round, and reactive to light.  Neck:     Vascular: No JVD.     Comments: Thyroidectomy scar. Cardiovascular:     Rate and Rhythm: Normal rate and  regular rhythm.     Heart sounds: Normal heart sounds. No murmur heard.   Pulmonary:     Effort: Pulmonary effort is normal. No respiratory distress.     Breath sounds: Normal breath sounds. No wheezing or rales.  Abdominal:     General: Bowel sounds are normal. There is no distension.     Palpations: Abdomen is soft. There is no mass.     Tenderness: There is no abdominal tenderness. There is no guarding or rebound.  Musculoskeletal:        General: No tenderness or edema. Normal range of motion.     Cervical back: Normal range of motion and neck supple.  Lymphadenopathy:     Head:     Right side of head: No preauricular, posterior auricular or occipital adenopathy.     Left side of  head: No preauricular, posterior auricular or occipital adenopathy.     Cervical: No cervical adenopathy.     Upper Body:  No axillary adenopathy present.    Right upper body: No supraclavicular adenopathy.     Left upper body: No supraclavicular adenopathy.     Lower Body: No right inguinal adenopathy. No left inguinal adenopathy.  Skin:    General: Skin is warm and dry.     Coloration: Skin is not pale.     Findings: No erythema or rash.  Neurological:     Mental Status: She is alert and oriented to person, place, and time.     Deep Tendon Reflexes: Reflexes are normal and symmetric.  Psychiatric:        Mood and Affect: Mood and affect normal.        Behavior: Behavior normal.        Thought Content: Thought content normal.        Judgment: Judgment normal.    Appointment on 05/29/2019  Component Date Value Ref Range Status  . Sodium 05/29/2019 135  135 - 145 mmol/L Final  . Potassium 05/29/2019 4.6  3.5 - 5.1 mmol/L Final  . Chloride 05/29/2019 100  98 - 111 mmol/L Final  . CO2 05/29/2019 27  22 - 32 mmol/L Final  . Glucose, Bld 05/29/2019 94  70 - 99 mg/dL Final   Glucose reference range applies only to samples taken after fasting for at least 8 hours.  . BUN 05/29/2019 14  8 - 23 mg/dL  Final  . Creatinine, Ser 05/29/2019 0.65  0.44 - 1.00 mg/dL Final  . Calcium 05/29/2019 9.2  8.9 - 10.3 mg/dL Final  . Total Protein 05/29/2019 7.1  6.5 - 8.1 g/dL Final  . Albumin 05/29/2019 4.2  3.5 - 5.0 g/dL Final  . AST 05/29/2019 17  15 - 41 U/L Final  . ALT 05/29/2019 15  0 - 44 U/L Final  . Alkaline Phosphatase 05/29/2019 48  38 - 126 U/L Final  . Total Bilirubin 05/29/2019 0.5  0.3 - 1.2 mg/dL Final  . GFR calc non Af Amer 05/29/2019 >60  >60 mL/min Final  . GFR calc Af Amer 05/29/2019 >60  >60 mL/min Final  . Anion gap 05/29/2019 8  5 - 15 Final   Performed at [facility], [address] [place], [place] [address]  . WBC 05/29/2019 4.6  4.0 - 10.5 K/uL Final  . RBC 05/29/2019 4.61  3.87 - 5.11 MIL/uL Final  . Hemoglobin 05/29/2019 13.2  12.0 - 15.0 g/dL Final  . HCT 05/29/2019 40.1  36.0 - 46.0 % Final  . MCV 05/29/2019 87.0  80.0 - 100.0 fL Final  . MCH 05/29/2019 28.6  26.0 - 34.0 pg Final  . MCHC 05/29/2019 32.9  30.0 - 36.0 g/dL Final  . RDW 05/29/2019 14.2  11.5 - 15.5 % Final  . Platelets 05/29/2019 214  150 - 400 K/uL Final  . nRBC 05/29/2019 0.0  0.0 - 0.2 % Final  . Neutrophils Relative % 05/29/2019 39  % Final  . Neutro Abs 05/29/2019 1.8  1.7 - 7.7 K/uL Final  . Lymphocytes Relative 05/29/2019 49  % Final  . Lymphs Abs 05/29/2019 2.2  0.7 - 4.0 K/uL Final  . Monocytes Relative 05/29/2019 10  % Final  . Monocytes Absolute 05/29/2019 0.4  0.1 - 1.0 K/uL Final  . Eosinophils Relative 05/29/2019 1  % Final  . Eosinophils Absolute 05/29/2019 0.1  0.0 -  0.5 K/uL Final  . Basophils Relative 05/29/2019 1  % Final  . Basophils Absolute 05/29/2019 0.0  0.0 - 0.1 K/uL Final  . Immature Granulocytes 05/29/2019 0  % Final  . Abs Immature Granulocytes 05/29/2019 0.01  0.00 - 0.07 K/uL Final   Performed at [facility], [address] [place], [place] [address]    Assessment:  Tanya Frey is a 74 y.o. female with stage III papillary  carcinoma thyroid carcinoma s/p thyroidectomy in 02/21/2017.  Pathology revealed a 4.6 cm papillary thyroid carcinoma with extrathyroidal extension.  There was angioinvasion but no lymphatic invasion.  Margins were negative.  Pathologic stage was pT4a Nx (stage III).  Soft tissue neck CT on 01/11/2017 revealed a 5 cm complex cystic and solid mass arising from the right lobe of the thyroid compatible with known carcinoma. There was no malignant adenopathy.  She received 130.6 mCi I-131 with Thyrogen stimulation on 04/26/2017.  Whole body I-131 scan on 05/04/2017 revealed uptake at thyroid bed consistent with thyroid remnant.  There was no scintigraphic evidence of iodine-avid metastatic thyroid cancer.  Thyroid ultrasound on 08/06/2018 revealed no residual or recurrent tissue post thyroidectomy.  She has chronic swallowing issues post surgery.   Bone density on 07/05/2016 revealed osteopenia with a T-score of -1.5 in L1-L4 and -13 in the left femoral neck.  Bone density on 08/15/2018 revealed osteopenia in the left femur neck with a T-score of -1.1 in the left femoral neck.  She continued calcium and vitamin D  Symptomatically, she is doing well.  She denies any issues with choking.  Exam is stable.  Plan: 1.   Labs today: CBC with diff, CMP, TSH, free T4, thyroglobulin, and anti-thyroglobulin antibody.  2.   Stage III thyroid carcinoma  Patient is s/p thyroidectomy 02/2017.  Patient is s/p I-131.  Thyroid ultrasound on 08/06/2018 revealed no residual disease.  RN to call with thyroid function testing and possible adjustment of Synthroid. 3.   Osteopenia  Bone density on 08/15/2018 confirmed osteopenia.  Continue calcium and vitamin D. 4.   RTC in 6 months for MD assessment and labs (CBC with diff, CMP, TSH, free T4, thyroglobulin and antithyroglobulin antibodies).  I discussed the assessment and treatment plan with the patient.  The patient was provided an opportunity to ask questions and all  were answered.  The patient agreed with the plan and demonstrated an understanding of the instructions.  The patient was advised to call back if the symptoms worsen or if the condition fails to improve as anticipated.     Lequita Asal, MD, PhD    05/29/2019, 9:46 AM   I, Heywood Footman, am acting as a Education administrator for Lequita Asal, MD.  I, [place] Mike Gip, MD, have reviewed the above documentation for accuracy and completeness, and I agree with the above.

## 2019-05-29 ENCOUNTER — Other Ambulatory Visit: Payer: Self-pay

## 2019-05-29 ENCOUNTER — Inpatient Hospital Stay (HOSPITAL_BASED_OUTPATIENT_CLINIC_OR_DEPARTMENT_OTHER): Payer: Medicare HMO | Admitting: Hematology and Oncology

## 2019-05-29 ENCOUNTER — Encounter: Payer: Self-pay | Admitting: Hematology and Oncology

## 2019-05-29 ENCOUNTER — Telehealth: Payer: Self-pay

## 2019-05-29 ENCOUNTER — Inpatient Hospital Stay: Payer: Medicare HMO | Attending: Hematology and Oncology

## 2019-05-29 VITALS — BP 140/67 | HR 59 | Temp 97.9°F | Resp 18 | Ht 65.0 in | Wt 166.0 lb

## 2019-05-29 DIAGNOSIS — C73 Malignant neoplasm of thyroid gland: Secondary | ICD-10-CM

## 2019-05-29 DIAGNOSIS — E785 Hyperlipidemia, unspecified: Secondary | ICD-10-CM | POA: Insufficient documentation

## 2019-05-29 DIAGNOSIS — E890 Postprocedural hypothyroidism: Secondary | ICD-10-CM | POA: Insufficient documentation

## 2019-05-29 DIAGNOSIS — Z7982 Long term (current) use of aspirin: Secondary | ICD-10-CM | POA: Diagnosis not present

## 2019-05-29 DIAGNOSIS — Z85850 Personal history of malignant neoplasm of thyroid: Secondary | ICD-10-CM | POA: Insufficient documentation

## 2019-05-29 DIAGNOSIS — Z87891 Personal history of nicotine dependence: Secondary | ICD-10-CM | POA: Insufficient documentation

## 2019-05-29 DIAGNOSIS — M8588 Other specified disorders of bone density and structure, other site: Secondary | ICD-10-CM

## 2019-05-29 DIAGNOSIS — Z79899 Other long term (current) drug therapy: Secondary | ICD-10-CM | POA: Insufficient documentation

## 2019-05-29 DIAGNOSIS — E669 Obesity, unspecified: Secondary | ICD-10-CM | POA: Diagnosis not present

## 2019-05-29 DIAGNOSIS — Z803 Family history of malignant neoplasm of breast: Secondary | ICD-10-CM | POA: Insufficient documentation

## 2019-05-29 LAB — CBC WITH DIFFERENTIAL/PLATELET
Abs Immature Granulocytes: 0.01 10*3/uL (ref 0.00–0.07)
Basophils Absolute: 0 10*3/uL (ref 0.0–0.1)
Basophils Relative: 1 %
Eosinophils Absolute: 0.1 10*3/uL (ref 0.0–0.5)
Eosinophils Relative: 1 %
HCT: 40.1 % (ref 36.0–46.0)
Hemoglobin: 13.2 g/dL (ref 12.0–15.0)
Immature Granulocytes: 0 %
Lymphocytes Relative: 49 %
Lymphs Abs: 2.2 10*3/uL (ref 0.7–4.0)
MCH: 28.6 pg (ref 26.0–34.0)
MCHC: 32.9 g/dL (ref 30.0–36.0)
MCV: 87 fL (ref 80.0–100.0)
Monocytes Absolute: 0.4 10*3/uL (ref 0.1–1.0)
Monocytes Relative: 10 %
Neutro Abs: 1.8 10*3/uL (ref 1.7–7.7)
Neutrophils Relative %: 39 %
Platelets: 214 10*3/uL (ref 150–400)
RBC: 4.61 MIL/uL (ref 3.87–5.11)
RDW: 14.2 % (ref 11.5–15.5)
WBC: 4.6 10*3/uL (ref 4.0–10.5)
nRBC: 0 % (ref 0.0–0.2)

## 2019-05-29 LAB — COMPREHENSIVE METABOLIC PANEL
ALT: 15 U/L (ref 0–44)
AST: 17 U/L (ref 15–41)
Albumin: 4.2 g/dL (ref 3.5–5.0)
Alkaline Phosphatase: 48 U/L (ref 38–126)
Anion gap: 8 (ref 5–15)
BUN: 14 mg/dL (ref 8–23)
CO2: 27 mmol/L (ref 22–32)
Calcium: 9.2 mg/dL (ref 8.9–10.3)
Chloride: 100 mmol/L (ref 98–111)
Creatinine, Ser: 0.65 mg/dL (ref 0.44–1.00)
GFR calc Af Amer: >60 mL/min (ref >60)
GFR calc non Af Amer: >60 mL/min (ref >60)
Glucose, Bld: 94 mg/dL (ref 70–99)
Potassium: 4.6 mmol/L (ref 3.5–5.1)
Sodium: 135 mmol/L (ref 135–145)
Total Bilirubin: 0.5 mg/dL (ref 0.3–1.2)
Total Protein: 7.1 g/dL (ref 6.5–8.1)

## 2019-05-29 LAB — T4, FREE: Free T4: 1.57 ng/dL — ABNORMAL HIGH (ref 0.61–1.12)

## 2019-05-29 LAB — TSH: TSH: <0.01 u[IU]/mL — ABNORMAL LOW (ref 0.350–4.500)

## 2019-05-29 MED ORDER — LEVOTHYROXINE SODIUM 125 MCG PO TABS: 125.0000 ug | ORAL_TABLET | Freq: Every day | ORAL | 1 refills | Status: DC

## 2019-05-29 NOTE — Telephone Encounter (Signed)
-----   Message from Lequita Asal, MD sent at 05/29/2019  2:18 PM EDT ----- Regarding: Please call patient  Free T4 is a little high.  New Rx sent in for Synthroid at a slightly lower dose (125 mcg instead of 135 mcg).  We will check TSH and free T4 in 2 months.  M ----- Message ----- From: Buel Ream, [facility] [place] Sent: 05/29/2019   9:13 AM EDT To: Lequita Asal, MD

## 2019-05-29 NOTE — Telephone Encounter (Signed)
Spoke with the patient to inform her that her Free T4 was a littl high, Per Dr Mike Gip she has called in a lower dose of Synthroid 125 mcg unstead of 135 mcg. The patient was inform to go to the pharmacy to pick new rx.  The patient was understanding and agreeable.

## 2019-05-30 LAB — THYROGLOBULIN BY IMA: Thyroglobulin by IMA: <0.1 ng/mL — ABNORMAL LOW (ref 1.5–38.5)

## 2019-05-30 LAB — TGAB+THYROGLOBULIN IMA OR RIA: Thyroglobulin Antibody: <1 IU/mL (ref 0.0–0.9)

## 2019-06-04 ENCOUNTER — Telehealth: Payer: Self-pay

## 2019-06-04 NOTE — Telephone Encounter (Signed)
Left a message To inform the patient of her lab results ( TSH 0.010) and ( Free T4 1.57). I informed the patient if she has any question please call [phone].

## 2019-06-21 DIAGNOSIS — Z0001 Encounter for general adult medical examination with abnormal findings: Secondary | ICD-10-CM | POA: Diagnosis not present

## 2019-06-21 DIAGNOSIS — E7800 Pure hypercholesterolemia, unspecified: Secondary | ICD-10-CM | POA: Diagnosis not present

## 2019-06-21 DIAGNOSIS — E039 Hypothyroidism, unspecified: Secondary | ICD-10-CM | POA: Diagnosis not present

## 2019-06-28 DIAGNOSIS — Z79899 Other long term (current) drug therapy: Secondary | ICD-10-CM | POA: Diagnosis not present

## 2019-06-28 DIAGNOSIS — E039 Hypothyroidism, unspecified: Secondary | ICD-10-CM | POA: Diagnosis not present

## 2019-06-28 DIAGNOSIS — Z87891 Personal history of nicotine dependence: Secondary | ICD-10-CM | POA: Diagnosis not present

## 2019-06-28 DIAGNOSIS — E7800 Pure hypercholesterolemia, unspecified: Secondary | ICD-10-CM | POA: Diagnosis not present

## 2019-06-28 DIAGNOSIS — Z0001 Encounter for general adult medical examination with abnormal findings: Secondary | ICD-10-CM | POA: Diagnosis not present

## 2019-07-12 ENCOUNTER — Other Ambulatory Visit: Payer: Self-pay | Admitting: Hematology and Oncology

## 2019-07-12 DIAGNOSIS — C73 Malignant neoplasm of thyroid gland: Secondary | ICD-10-CM

## 2019-07-17 ENCOUNTER — Other Ambulatory Visit: Payer: Self-pay | Admitting: Hematology and Oncology

## 2019-07-17 DIAGNOSIS — C73 Malignant neoplasm of thyroid gland: Secondary | ICD-10-CM

## 2019-07-17 MED ORDER — LEVOTHYROXINE SODIUM 125 MCG PO TABS: 125.0000 ug | ORAL_TABLET | Freq: Every day | ORAL | 1 refills | Status: DC

## 2019-07-22 DIAGNOSIS — D225 Melanocytic nevi of trunk: Secondary | ICD-10-CM | POA: Diagnosis not present

## 2019-07-22 DIAGNOSIS — L821 Other seborrheic keratosis: Secondary | ICD-10-CM | POA: Diagnosis not present

## 2019-07-22 DIAGNOSIS — S80861A Insect bite (nonvenomous), right lower leg, initial encounter: Secondary | ICD-10-CM | POA: Diagnosis not present

## 2019-07-22 DIAGNOSIS — D2262 Melanocytic nevi of left upper limb, including shoulder: Secondary | ICD-10-CM | POA: Diagnosis not present

## 2019-07-22 DIAGNOSIS — D2261 Melanocytic nevi of right upper limb, including shoulder: Secondary | ICD-10-CM | POA: Diagnosis not present

## 2019-07-22 DIAGNOSIS — D2272 Melanocytic nevi of left lower limb, including hip: Secondary | ICD-10-CM | POA: Diagnosis not present

## 2019-07-22 DIAGNOSIS — S80862A Insect bite (nonvenomous), left lower leg, initial encounter: Secondary | ICD-10-CM | POA: Diagnosis not present

## 2019-07-22 DIAGNOSIS — D2271 Melanocytic nevi of right lower limb, including hip: Secondary | ICD-10-CM | POA: Diagnosis not present

## 2019-09-17 ENCOUNTER — Other Ambulatory Visit: Payer: Self-pay | Admitting: Hematology and Oncology

## 2019-09-17 DIAGNOSIS — C73 Malignant neoplasm of thyroid gland: Secondary | ICD-10-CM

## 2019-10-16 ENCOUNTER — Other Ambulatory Visit: Payer: Self-pay

## 2019-10-16 DIAGNOSIS — Z1231 Encounter for screening mammogram for malignant neoplasm of breast: Secondary | ICD-10-CM

## 2019-11-27 NOTE — Progress Notes (Signed)
[facility]  [address], [address] [place], [place] [address] Phone: [phone]  Fax: [phone]   Clinic Day:  11/28/2019  Referring physician: [name], MD  Chief Complaint: Tanya Frey is a 74 y.o. female with stage III papillary carcinoma thyroid s/p radioactive iodine who is seen for 6 month assessment.   HPI:  The patient was last seen in the medical oncology clinic on 05/29/2019. At that time, she was doing well.  She denied any issues with choking.  Exam was stable. She continued calcium and vitamin D. Hematocrit was 40.1, hemoglobin 13.2, platelets 214,000, WBC 4,600. CMP was normal. Thyroglobulin was <0.1 and thyroglobulin antibody <1.0. TSH was <0.010 and free T4 was 1.57.  The patient is scheduled for a mammogram on 12/04/2019.  During the interim, she has been "fine." She occasionally gets food stuck in her throat if she eats too quickly or doesn't chew her food enough. Her voice is better. She denies nausea, vomiting, and diarrhea.  She is on Levothyroxine 125 mcg.   Past Medical History:  Diagnosis Date  . Anemia   . Difficult intubation   . Diffuse cystic mastopathy   . Family history of malignant neoplasm of breast   . Hyperlipidemia   . Obesity, unspecified   . Personal history of tobacco use, presenting hazards to health   . Thyroid cancer (HCC)    T4a, NX: 4.6 cm lesion with extrathyroidal extension. Received I 131 post procedure.    . Varicose veins     Past Surgical History:  Procedure Laterality Date  . BREAST EXCISIONAL BIOPSY Bilateral 1989   neg  . COLONOSCOPY  2011   Dr. [name]; colon polyps (tubular adenoma)  . COLONOSCOPY N/A   . COLONOSCOPY WITH PROPOFOL N/A 10/10/2014   Procedure: COLONOSCOPY WITH PROPOFOL;  Surgeon: [name], MD;  Location: [facility] ENDOSCOPY;  Service: Endoscopy;  Laterality: N/A;  . THYROID SURGERY Bilateral   . THYROIDECTOMY N/A 02/21/2017   Procedure: THYROIDECTOMY;   Surgeon: [name], MD;  Location: [facility] ORS;  Service: ENT;  Laterality: N/A;  . TUBAL LIGATION    . VARICOSE VEIN SURGERY    . vein closure procedure Right 2009    Family History  Problem Relation Age of Onset  . Breast cancer Daughter 80       [name] BRCA negative    Social History:  reports that she quit smoking about 29 years ago. She has a 10.00 pack-year smoking history. She has never used smokeless tobacco. She reports current alcohol use of about 1.0 - 4.0 standard drink of alcohol per week. She reports that she does not use drugs.  She is retired from [employer]. She lives in [place].  The patient is alone today.   Allergies:  Allergies  Allergen Reactions  . Cefdinir Rash  . Protonix [Pantoprazole Sodium] Anaphylaxis and Other (See Comments)    Current Medications: Current Outpatient Medications  Medication Sig Dispense Refill  . aspirin EC 81 MG tablet Take 81 mg by mouth daily.    . Calcium-Magnesium-Vitamin D (CALCIUM 1200+D3 PO) Take by mouth.    . levothyroxine (SYNTHROID) 125 MCG tablet Take 1 tablet (125 mcg total) by mouth daily before breakfast. 60 tablet 1  . simvastatin (ZOCOR) 40 MG tablet Take 40 mg by mouth every evening.      No current facility-administered medications for this visit.    Review of Systems  Constitutional: Positive for weight loss (7 lbs). Negative  for chills, diaphoresis, fever and malaise/fatigue.       Feels "fine".  HENT: Negative for congestion, ear discharge, ear pain, hearing loss, nosebleeds, sinus pain, sore throat and tinnitus.        Voice "better". Some trouble swallowing if she eats too quickly or doesn't chew her food enough.  Eyes: Negative.  Negative for blurred vision and double vision.  Respiratory: Negative.  Negative for cough, hemoptysis, sputum production and shortness of breath.   Cardiovascular: Negative.  Negative for chest pain, palpitations and leg swelling.  Gastrointestinal:  Negative for abdominal pain, blood in stool, constipation, diarrhea, heartburn, melena, nausea and vomiting.  Genitourinary: Negative.  Negative for dysuria, frequency, hematuria and urgency.  Musculoskeletal: Negative.  Negative for back pain, joint pain, myalgias and neck pain.  Skin: Negative.  Negative for itching and rash.  Neurological: Negative.  Negative for dizziness, tingling, sensory change, weakness and headaches.  Endo/Heme/Allergies: Negative.  Does not bruise/bleed easily.  Psychiatric/Behavioral: Negative.  Negative for depression and memory loss. The patient is not nervous/anxious and does not have insomnia.   All other systems reviewed and are negative.   Performance status (ECOG):  1  Vitals: Blood pressure (!) 143/55, pulse 64, temperature 97.7 F (36.5 C), temperature source Tympanic, weight 159 lb 9.8 oz (72.4 kg), SpO2 100 %.   Physical Exam Vitals and nursing note reviewed.  Constitutional:      General: She is not in acute distress.    Appearance: She is well-developed. She is not diaphoretic.  HENT:     Head: Normocephalic and atraumatic.     Comments: Styled dark hair with slight graying.    Mouth/Throat:     Mouth: Mucous membranes are moist.     Pharynx: Oropharynx is clear. No oropharyngeal exudate.  Eyes:     General: No scleral icterus.       Right eye: No discharge.        Left eye: No discharge.     Extraocular Movements: Extraocular movements intact.     Conjunctiva/sclera: Conjunctivae normal.     Pupils: Pupils are equal, round, and reactive to light.  Neck:     Vascular: No JVD.     Comments: Thyroidectomy scar. Cardiovascular:     Rate and Rhythm: Normal rate and regular rhythm.     Heart sounds: Normal heart sounds. No murmur heard.   Pulmonary:     Effort: Pulmonary effort is normal. No respiratory distress.     Breath sounds: Normal breath sounds. No wheezing or rales.  Chest:     Chest wall: No tenderness.  Abdominal:      General: Bowel sounds are normal. There is no distension.     Palpations: Abdomen is soft. There is no mass.     Tenderness: There is no abdominal tenderness. There is no guarding or rebound.  Musculoskeletal:        General: No tenderness. Normal range of motion.     Cervical back: Normal range of motion and neck supple.  Lymphadenopathy:     Head:     Right side of head: No preauricular, posterior auricular or occipital adenopathy.     Left side of head: No preauricular, posterior auricular or occipital adenopathy.     Cervical: No cervical adenopathy.     Upper Body:     Right upper body: No supraclavicular adenopathy.     Left upper body: No supraclavicular adenopathy.     Lower Body: No right inguinal adenopathy. No  left inguinal adenopathy.  Skin:    General: Skin is warm and dry.     Coloration: Skin is not pale.     Findings: No erythema or rash.  Neurological:     Mental Status: She is alert and oriented to person, place, and time.     Deep Tendon Reflexes: Reflexes are normal and symmetric.  Psychiatric:        Behavior: Behavior normal.        Thought Content: Thought content normal.        Judgment: Judgment normal.    Appointment on 11/28/2019  Component Date Value Ref Range Status  . Sodium 11/28/2019 136  135 - 145 mmol/L Final  . Potassium 11/28/2019 4.4  3.5 - 5.1 mmol/L Final  . Chloride 11/28/2019 100  98 - 111 mmol/L Final  . CO2 11/28/2019 28  22 - 32 mmol/L Final  . Glucose, Bld 11/28/2019 80  70 - 99 mg/dL Final   Glucose reference range applies only to samples taken after fasting for at least 8 hours.  . BUN 11/28/2019 12  8 - 23 mg/dL Final  . Creatinine, Ser 11/28/2019 0.64  0.44 - 1.00 mg/dL Final  . Calcium 11/28/2019 9.0  8.9 - 10.3 mg/dL Final  . Total Protein 11/28/2019 7.1  6.5 - 8.1 g/dL Final  . Albumin 11/28/2019 4.0  3.5 - 5.0 g/dL Final  . AST 11/28/2019 17  15 - 41 U/L Final  . ALT 11/28/2019 15  0 - 44 U/L Final  . Alkaline Phosphatase  11/28/2019 39  38 - 126 U/L Final  . Total Bilirubin 11/28/2019 0.3  0.3 - 1.2 mg/dL Final  . GFR, Estimated 11/28/2019 >60  >60 mL/min Final   Comment: (NOTE) Calculated using the CKD-EPI Creatinine Equation (2021)   . Anion gap 11/28/2019 8  5 - 15 Final   Performed at [facility], [address], [place], [place] [address]  . WBC 11/28/2019 4.9  4.0 - 10.5 K/uL Final  . RBC 11/28/2019 4.67  3.87 - 5.11 MIL/uL Final  . Hemoglobin 11/28/2019 13.2  12.0 - 15.0 g/dL Final  . HCT 11/28/2019 41.0  36 - 46 % Final  . MCV 11/28/2019 87.8  80.0 - 100.0 fL Final  . MCH 11/28/2019 28.3  26.0 - 34.0 pg Final  . MCHC 11/28/2019 32.2  30.0 - 36.0 g/dL Final  . RDW 11/28/2019 14.1  11.5 - 15.5 % Final  . Platelets 11/28/2019 206  150 - 400 K/uL Final  . nRBC 11/28/2019 0.0  0.0 - 0.2 % Final  . Neutrophils Relative % 11/28/2019 44  % Final  . Neutro Abs 11/28/2019 2.1  1.7 - 7.7 K/uL Final  . Lymphocytes Relative 11/28/2019 44  % Final  . Lymphs Abs 11/28/2019 2.2  0.7 - 4.0 K/uL Final  . Monocytes Relative 11/28/2019 9  % Final  . Monocytes Absolute 11/28/2019 0.4  0.1 - 1.0 K/uL Final  . Eosinophils Relative 11/28/2019 2  % Final  . Eosinophils Absolute 11/28/2019 0.1  0.0 - 0.5 K/uL Final  . Basophils Relative 11/28/2019 1  % Final  . Basophils Absolute 11/28/2019 0.0  0.0 - 0.1 K/uL Final  . Immature Granulocytes 11/28/2019 0  % Final  . Abs Immature Granulocytes 11/28/2019 0.01  0.00 - 0.07 K/uL Final   Performed at [facility], [address] [place], [place] [address]    Assessment:  Tanya Frey is a 74 y.o. female  with stage III papillary carcinoma thyroid carcinoma s/p thyroidectomy in 02/21/2017.  Pathology revealed a 4.6 cm papillary thyroid carcinoma with extrathyroidal extension.  There was angioinvasion but no lymphatic invasion.  Margins were negative.  Pathologic stage was pT4a Nx (stage III).  Soft tissue neck CT on 01/11/2017 revealed a 5 cm  complex cystic and solid mass arising from the right lobe of the thyroid compatible with known carcinoma. There was no malignant adenopathy.  She received 130.6 mCi I-131 with Thyrogen stimulation on 04/26/2017.  Whole body I-131 scan on 05/04/2017 revealed uptake at thyroid bed consistent with thyroid remnant.  There was no scintigraphic evidence of iodine-avid metastatic thyroid cancer.  Thyroid ultrasound on 08/06/2018 revealed no residual or recurrent tissue post thyroidectomy.  She has chronic swallowing issues post surgery.   Bone density on 07/05/2016 revealed osteopenia with a T-score of -1.5 in L1-L4 and -13 in the left femoral neck.  Bone density on 08/15/2018 revealed osteopenia in the left femur neck with a T-score of -1.1 in the left femoral neck.  She continued calcium and vitamin D  Symptomatically, she feels "fine".  Exam is stable.  Plan: 1.   Labs today: CBC with diff, CMP, TSH, free T4, thyroglobulin and antithyroglobulin antibodies 2.   Stage III thyroid carcinoma  Patient is s/p thyroidectomy 02/2017.  Patient is s/p I-131.  Thyroid ultrasound on 08/06/2018 revealed no residual disease.  Contact patient with today's labs. 3.   Osteopenia  Bone density on 08/15/2018 confirmed osteopenia.  Continue calcium and vitamin D. 4.   Call patient's pharmacy to ensure her thyroid medication was not recalled. 5.   Call patient with thyroid function levels and adjustment in Synthroid. 6.   RTC in 6 months for MD assessment and labs (CBC with diff, CMP, TSH, free T4, thyroglobulin and antithyroglobulin antibodies).  I discussed the assessment and treatment plan with the patient.  The patient was provided an opportunity to ask questions and all were answered.  The patient agreed with the plan and demonstrated an understanding of the instructions.  The patient was advised to call back if the symptoms worsen or if the condition fails to improve as anticipated.     Lequita Asal,  MD, PhD    11/28/2019, 10:15 AM   I, Mirian Mo Tufford, am acting as a Education administrator for Lequita Asal, MD.  I, [place] Mike Gip, MD, have reviewed the above documentation for accuracy and completeness, and I agree with the above.

## 2019-11-28 ENCOUNTER — Encounter: Payer: Self-pay | Admitting: Hematology and Oncology

## 2019-11-28 ENCOUNTER — Inpatient Hospital Stay: Payer: Medicare HMO | Attending: Hematology and Oncology

## 2019-11-28 ENCOUNTER — Inpatient Hospital Stay: Payer: Medicare HMO | Admitting: Hematology and Oncology

## 2019-11-28 ENCOUNTER — Other Ambulatory Visit: Payer: Self-pay

## 2019-11-28 VITALS — BP 143/55 | HR 64 | Temp 97.7°F | Wt 159.6 lb

## 2019-11-28 DIAGNOSIS — M85852 Other specified disorders of bone density and structure, left thigh: Secondary | ICD-10-CM | POA: Insufficient documentation

## 2019-11-28 DIAGNOSIS — Z79899 Other long term (current) drug therapy: Secondary | ICD-10-CM | POA: Diagnosis not present

## 2019-11-28 DIAGNOSIS — Z87891 Personal history of nicotine dependence: Secondary | ICD-10-CM | POA: Diagnosis not present

## 2019-11-28 DIAGNOSIS — M8588 Other specified disorders of bone density and structure, other site: Secondary | ICD-10-CM | POA: Diagnosis not present

## 2019-11-28 DIAGNOSIS — R1310 Dysphagia, unspecified: Secondary | ICD-10-CM | POA: Insufficient documentation

## 2019-11-28 DIAGNOSIS — C73 Malignant neoplasm of thyroid gland: Secondary | ICD-10-CM | POA: Diagnosis not present

## 2019-11-28 LAB — CBC WITH DIFFERENTIAL/PLATELET
Abs Immature Granulocytes: 0.01 10*3/uL (ref 0.00–0.07)
Basophils Absolute: 0 10*3/uL (ref 0.0–0.1)
Basophils Relative: 1 %
Eosinophils Absolute: 0.1 10*3/uL (ref 0.0–0.5)
Eosinophils Relative: 2 %
HCT: 41 % (ref 36.0–46.0)
Hemoglobin: 13.2 g/dL (ref 12.0–15.0)
Immature Granulocytes: 0 %
Lymphocytes Relative: 44 %
Lymphs Abs: 2.2 10*3/uL (ref 0.7–4.0)
MCH: 28.3 pg (ref 26.0–34.0)
MCHC: 32.2 g/dL (ref 30.0–36.0)
MCV: 87.8 fL (ref 80.0–100.0)
Monocytes Absolute: 0.4 10*3/uL (ref 0.1–1.0)
Monocytes Relative: 9 %
Neutro Abs: 2.1 10*3/uL (ref 1.7–7.7)
Neutrophils Relative %: 44 %
Platelets: 206 10*3/uL (ref 150–400)
RBC: 4.67 MIL/uL (ref 3.87–5.11)
RDW: 14.1 % (ref 11.5–15.5)
WBC: 4.9 10*3/uL (ref 4.0–10.5)
nRBC: 0 % (ref 0.0–0.2)

## 2019-11-28 LAB — COMPREHENSIVE METABOLIC PANEL
ALT: 15 U/L (ref 0–44)
AST: 17 U/L (ref 15–41)
Albumin: 4 g/dL (ref 3.5–5.0)
Alkaline Phosphatase: 39 U/L (ref 38–126)
Anion gap: 8 (ref 5–15)
BUN: 12 mg/dL (ref 8–23)
CO2: 28 mmol/L (ref 22–32)
Calcium: 9 mg/dL (ref 8.9–10.3)
Chloride: 100 mmol/L (ref 98–111)
Creatinine, Ser: 0.64 mg/dL (ref 0.44–1.00)
GFR, Estimated: >60 mL/min (ref >60)
Glucose, Bld: 80 mg/dL (ref 70–99)
Potassium: 4.4 mmol/L (ref 3.5–5.1)
Sodium: 136 mmol/L (ref 135–145)
Total Bilirubin: 0.3 mg/dL (ref 0.3–1.2)
Total Protein: 7.1 g/dL (ref 6.5–8.1)

## 2019-11-28 LAB — T4, FREE: Free T4: 1.3 ng/dL — ABNORMAL HIGH (ref 0.61–1.12)

## 2019-11-28 LAB — TSH: TSH: 0.016 u[IU]/mL — ABNORMAL LOW (ref 0.350–4.500)

## 2019-11-28 NOTE — Progress Notes (Signed)
No new changes noted today 

## 2019-11-29 LAB — THYROGLOBULIN ANTIBODY: Thyroglobulin Antibody: <1 IU/mL (ref 0.0–0.9)

## 2019-12-04 ENCOUNTER — Ambulatory Visit
Admission: RE | Admit: 2019-12-04 | Discharge: 2019-12-04 | Disposition: A | Discharge status: Home / Self Care | Payer: Medicare HMO | Source: Ambulatory Visit | Attending: Surgery | Admitting: Surgery

## 2019-12-04 ENCOUNTER — Other Ambulatory Visit: Payer: Self-pay

## 2019-12-04 DIAGNOSIS — Z1231 Encounter for screening mammogram for malignant neoplasm of breast: Secondary | ICD-10-CM

## 2019-12-05 ENCOUNTER — Other Ambulatory Visit: Payer: Self-pay | Admitting: Surgery

## 2019-12-05 DIAGNOSIS — N6489 Other specified disorders of breast: Secondary | ICD-10-CM

## 2019-12-05 DIAGNOSIS — R928 Other abnormal and inconclusive findings on diagnostic imaging of breast: Secondary | ICD-10-CM

## 2019-12-09 ENCOUNTER — Telehealth: Payer: Self-pay | Admitting: *Deleted

## 2019-12-09 NOTE — Telephone Encounter (Signed)
Called pt on cell phone and no answer, called home phone and no answer- did leave voicemail on the home phone. Dr. Mike Gip wanted me to call about if her levothyroxine is on a recall-pt had mentioned that she thought it was on recall. I spoke to pharmacist at [facility] where pt goes to and was told that there is no recall on levothyroxine. Based in her levels and I left the number for free t4, and Tsh that pt needs to decrease from 125 mcg that is the current dose in the computer to decrease 112 mcg. I have asked pt to call back so we can confirm her dose of thyroid med and make the appropriate dose to decrease to. Left number here in [place]

## 2019-12-10 ENCOUNTER — Other Ambulatory Visit: Payer: Self-pay

## 2019-12-10 ENCOUNTER — Telehealth: Payer: Self-pay | Admitting: *Deleted

## 2019-12-10 DIAGNOSIS — C73 Malignant neoplasm of thyroid gland: Secondary | ICD-10-CM

## 2019-12-10 LAB — THYROGLOBULIN LEVEL: Thyroglobulin: <2 ng/mL

## 2019-12-10 MED ORDER — LEVOTHYROXINE SODIUM 112 MCG PO TABS: 112.0000 ug | ORAL_TABLET | Freq: Every day | ORAL | 2 refills | Status: DC

## 2019-12-10 NOTE — Telephone Encounter (Signed)
Patient called stating that she was returning a call from our office about her thyroid medicine and dose decrease. I read the note from yesterday and had her confirm her current dose which she reports as 1.25 mg She will need a new prescription sent for the decreased dose of 112 mcg sent to [facility] on [address].

## 2019-12-11 ENCOUNTER — Ambulatory Visit: Payer: Medicare HMO | Admitting: Surgery

## 2019-12-18 ENCOUNTER — Ambulatory Visit
Admission: RE | Admit: 2019-12-18 | Discharge: 2019-12-18 | Disposition: A | Discharge status: Home / Self Care | Payer: Medicare HMO | Source: Ambulatory Visit | Attending: Surgery | Admitting: Surgery

## 2019-12-18 ENCOUNTER — Other Ambulatory Visit: Payer: Self-pay

## 2019-12-18 DIAGNOSIS — R928 Other abnormal and inconclusive findings on diagnostic imaging of breast: Secondary | ICD-10-CM

## 2019-12-18 DIAGNOSIS — N6489 Other specified disorders of breast: Secondary | ICD-10-CM | POA: Diagnosis not present

## 2019-12-19 ENCOUNTER — Other Ambulatory Visit: Payer: Self-pay | Admitting: Surgery

## 2019-12-19 DIAGNOSIS — N6310 Unspecified lump in the right breast, unspecified quadrant: Secondary | ICD-10-CM

## 2019-12-19 DIAGNOSIS — R928 Other abnormal and inconclusive findings on diagnostic imaging of breast: Secondary | ICD-10-CM

## 2019-12-23 ENCOUNTER — Ambulatory Visit: Payer: Medicare HMO | Admitting: Surgery

## 2019-12-27 ENCOUNTER — Ambulatory Visit
Admission: RE | Admit: 2019-12-27 | Discharge: 2019-12-27 | Disposition: A | Discharge status: Home / Self Care | Payer: Medicare HMO | Source: Ambulatory Visit | Attending: Surgery | Admitting: Surgery

## 2019-12-27 ENCOUNTER — Other Ambulatory Visit: Payer: Self-pay

## 2019-12-27 DIAGNOSIS — R928 Other abnormal and inconclusive findings on diagnostic imaging of breast: Secondary | ICD-10-CM

## 2019-12-27 DIAGNOSIS — C50411 Malignant neoplasm of upper-outer quadrant of right female breast: Secondary | ICD-10-CM | POA: Diagnosis not present

## 2019-12-27 DIAGNOSIS — C50919 Malignant neoplasm of unspecified site of unspecified female breast: Secondary | ICD-10-CM

## 2019-12-27 DIAGNOSIS — N6311 Unspecified lump in the right breast, upper outer quadrant: Secondary | ICD-10-CM | POA: Diagnosis not present

## 2019-12-27 DIAGNOSIS — N6310 Unspecified lump in the right breast, unspecified quadrant: Secondary | ICD-10-CM | POA: Insufficient documentation

## 2019-12-27 HISTORY — PX: BREAST BIOPSY: SHX20

## 2019-12-27 HISTORY — DX: Malignant neoplasm of unspecified site of unspecified female breast: C50.919

## 2020-01-06 ENCOUNTER — Ambulatory Visit (INDEPENDENT_AMBULATORY_CARE_PROVIDER_SITE_OTHER): Payer: Medicare HMO | Admitting: Surgery

## 2020-01-06 ENCOUNTER — Other Ambulatory Visit: Payer: Self-pay

## 2020-01-06 ENCOUNTER — Encounter: Payer: Self-pay | Admitting: Surgery

## 2020-01-06 VITALS — BP 162/89 | HR 69 | Temp 97.8°F | Ht 66.0 in | Wt 157.8 lb

## 2020-01-06 DIAGNOSIS — C50411 Malignant neoplasm of upper-outer quadrant of right female breast: Secondary | ICD-10-CM | POA: Diagnosis not present

## 2020-01-06 DIAGNOSIS — Z170 Estrogen receptor positive status [ER+]: Secondary | ICD-10-CM | POA: Diagnosis not present

## 2020-01-06 LAB — SURGICAL PATHOLOGY

## 2020-01-06 NOTE — Patient Instructions (Signed)
Please see your follow up appointment listed below.  °

## 2020-01-06 NOTE — Progress Notes (Signed)
[facility]  [address], [address] [place], [place] [address] Phone: [phone]  Fax: [phone]   Clinic Day:  01/07/2020  Referring physician: [name], MD  Chief Complaint: Tanya Frey is a 74 y.o. female with stage III papillary carcinoma thyroid s/p radioactive iodine who is seen for reassessment after interval diagnosis of breast cancer.   HPI:  The patient was last seen in the medical oncology clinic on 11/28/2019. At that time, she felt "fine".  Exam was stable. Hematocrit was 41.0, hemoglobin 13.2, platelets 206,000, WBC 4,900. CMP was normal. Thyroglobulin was <2.0, thyroglobulin antibody was <1.0. Free T4 was 1.30 and TSH was 0.016.  Bilateral screening mammogram on 12/04/2019 revealed possible asymmetry in the right breast.  Right diagnostic mammogram on 12/18/2019 revealed an irregular 6 mm mass in the RIGHT upper outer breast. Ultrasound-guided biopsy was recommended. There was no RIGHT axillary adenopathy.  Right breast biopsy on 12/27/2019 revealed a 7 mm grade I invasive mammary carcinoma with DCIS. Tissue was ER+ (>90%), PR+ (>90%), and HER-2 negative (score 0).  During the interim, she has been "ok". She is seeing Dr. Dahlia Byes tomorrow. She is leaning towards a lumpectomy with radiation at this time.   Past Medical History:  Diagnosis Date  . Anemia   . Difficult intubation   . Diffuse cystic mastopathy   . Family history of malignant neoplasm of breast   . Hyperlipidemia   . Obesity, unspecified   . Personal history of tobacco use, presenting hazards to health   . Thyroid cancer (HCC)    T4a, NX: 4.6 cm lesion with extrathyroidal extension. Received I 131 post procedure.    . Varicose veins     Past Surgical History:  Procedure Laterality Date  . BREAST EXCISIONAL BIOPSY Bilateral 1989   neg  . BREAST SURGERY    . COLONOSCOPY  2011   Dr. [name]; colon polyps (tubular adenoma)  . COLONOSCOPY N/A   .  COLONOSCOPY WITH PROPOFOL N/A 10/10/2014   Procedure: COLONOSCOPY WITH PROPOFOL;  Surgeon: [name], MD;  Location: [facility] ENDOSCOPY;  Service: Endoscopy;  Laterality: N/A;  . THYROID SURGERY Bilateral   . THYROIDECTOMY N/A 02/21/2017   Procedure: THYROIDECTOMY;  Surgeon: [name], MD;  Location: [facility] ORS;  Service: ENT;  Laterality: N/A;  . TUBAL LIGATION    . VARICOSE VEIN SURGERY    . vein closure procedure Right 2009    Family History  Problem Relation Age of Onset  . Breast cancer Daughter 44       [name] BRCA negative    Social History:  reports that she quit smoking about 29 years ago. She has a 10.00 pack-year smoking history. She has never used smokeless tobacco. She reports current alcohol use of about 1.0 - 4.0 standard drink of alcohol per week. She reports that she does not use drugs.  She is retired from [employer]. She lives in [place].  The patient is alone today.   Allergies:  Allergies  Allergen Reactions  . Cefdinir Rash  . Protonix [Pantoprazole Sodium] Anaphylaxis and Other (See Comments)    Current Medications: Current Outpatient Medications  Medication Sig Dispense Refill  . Calcium-Magnesium-Vitamin D (CALCIUM 1200+D3 PO) Take by mouth.    . levothyroxine (SYNTHROID) 112 MCG tablet Take 1 tablet (112 mcg total) by mouth daily before breakfast. 30 tablet 2  . simvastatin (ZOCOR) 40 MG tablet Take 40 mg by mouth every evening.     Marland Kitchen aspirin  EC 81 MG tablet Take 81 mg by mouth daily.    Marland Kitchen levothyroxine (SYNTHROID) 125 MCG tablet Take 1 tablet (125 mcg total) by mouth daily before breakfast. 60 tablet 1   No current facility-administered medications for this visit.    Review of Systems  Constitutional: Positive for weight loss (1 lb). Negative for chills, diaphoresis, fever and malaise/fatigue.       Feels "ok".  HENT: Negative.  Negative for congestion, ear discharge, ear pain, hearing loss, nosebleeds, sinus pain, sore throat  and tinnitus.   Eyes: Negative.  Negative for blurred vision and double vision.  Respiratory: Negative.  Negative for cough, hemoptysis, sputum production and shortness of breath.   Cardiovascular: Negative.  Negative for chest pain, palpitations and leg swelling.  Gastrointestinal: Negative for abdominal pain, blood in stool, constipation, diarrhea, heartburn, melena, nausea and vomiting.  Genitourinary: Negative.  Negative for dysuria, frequency, hematuria and urgency.  Musculoskeletal: Negative.  Negative for back pain, joint pain, myalgias and neck pain.  Skin: Negative.  Negative for itching and rash.  Neurological: Negative.  Negative for dizziness, tingling, sensory change, weakness and headaches.  Endo/Heme/Allergies: Negative.  Does not bruise/bleed easily.  Psychiatric/Behavioral: Negative.  Negative for depression and memory loss. The patient is not nervous/anxious and does not have insomnia.   All other systems reviewed and are negative.   Performance status (ECOG):  0-1  Vitals: Blood pressure (!) 146/77, pulse 70, temperature (!) 97 F (36.1 C), resp. rate 20, weight 158 lb 13.5 oz (72 kg), SpO2 99 %.   Physical Exam Vitals and nursing note reviewed.  Constitutional:      General: She is not in acute distress.    Appearance: She is well-developed. She is not diaphoretic.  HENT:     Head: Normocephalic and atraumatic.     Comments: Styled dark hair with slight graying.    Mouth/Throat:     Mouth: Mucous membranes are moist.     Pharynx: Oropharynx is clear. No oropharyngeal exudate.  Eyes:     General: No scleral icterus.       Right eye: No discharge.        Left eye: No discharge.     Extraocular Movements: Extraocular movements intact.     Conjunctiva/sclera: Conjunctivae normal.     Pupils: Pupils are equal, round, and reactive to light.  Neck:     Vascular: No JVD.     Comments: Thyroidectomy scar. Cardiovascular:     Rate and Rhythm: Normal rate and regular  rhythm.     Heart sounds: Normal heart sounds. No murmur heard.   Pulmonary:     Effort: Pulmonary effort is normal. No respiratory distress.     Breath sounds: Normal breath sounds. No wheezing or rales.  Chest:     Chest wall: No tenderness.     Breasts:        Right: No tenderness.        Left: No tenderness.     Comments: Hematoma in the outer quadrant of the right breast with overlying ecchymosis Abdominal:     General: Bowel sounds are normal. There is no distension.     Palpations: Abdomen is soft. There is no mass.     Tenderness: There is no abdominal tenderness. There is no guarding or rebound.  Musculoskeletal:        General: No tenderness. Normal range of motion.     Cervical back: Normal range of motion and neck supple.  Lymphadenopathy:  Head:     Right side of head: No preauricular, posterior auricular or occipital adenopathy.     Left side of head: No preauricular, posterior auricular or occipital adenopathy.     Cervical: No cervical adenopathy.     Upper Body:     Right upper body: No supraclavicular adenopathy.     Left upper body: No supraclavicular adenopathy.     Lower Body: No right inguinal adenopathy. No left inguinal adenopathy.  Skin:    General: Skin is warm and dry.     Coloration: Skin is not pale.     Findings: No erythema or rash.  Neurological:     Mental Status: She is alert and oriented to person, place, and time.     Deep Tendon Reflexes: Reflexes are normal and symmetric.  Psychiatric:        Behavior: Behavior normal.        Thought Content: Thought content normal.        Judgment: Judgment normal.    No visits with results within 3 Day(s) from this visit.  Latest known visit with results is:  Hospital Outpatient Visit on 12/27/2019  Component Date Value Ref Range Status  . SURGICAL PATHOLOGY 12/27/2019    Final-Edited                   Value:SURGICAL PATHOLOGY THIS IS AN ADDENDUM REPORT CASE: [id number] PATIENT: Tanya Frey Surgical Pathology Report Addendum  Reason for Addendum #1:  Breast Biomarker Results  Specimen Submitted: A. Right breast, 11:00 3cm fn; [place] bx  Clinical History: 74 year old female with 0.6 cm upper outer right breast mass; vision clip deployed post procedure   DIAGNOSIS: A. BREAST, RIGHT 11:00 3 CM FN; ULTRASOUND-GUIDED BIOPSY: - INVASIVE MAMMARY CARCINOMA, NO SPECIAL TYPE.  Size of invasive carcinoma: 7 mm in this sample Histologic grade of invasive carcinoma: Grade 1                      Glandular/tubular differentiation score: 2                      Nuclear pleomorphism score: 2                      Mitotic rate score: 1                      Total score: 5 Ductal carcinoma in situ: Present, nuclear grade 2 Lymphovascular invasion: Not identified  ER/PR/HER2: Immunohistochemistry will be performed on block A1, with reflex to F                         ISH for HER2 2+. The results will be reported in an addendum.  Comment: The definitive grade will be assigned on the excisional specimen.  GROSS DESCRIPTION: A. Labeled: Right breast 11:00 3 cm from nipple Received: In formalin Time/date in fixative: 9 AM on 12/27/2019 Cold ischemic time: Less than 1 minute Total fixation time: 8 hours Core pieces: 3 Size: 1.2-1.4 cm in length and 0.3 cm in diameter Description: Pale yellow cylindrically shaped tissue fragments Ink color: Green Entirely submitted in 1 cassette.   Final Diagnosis performed by Quay Burow, MD.   Electronically signed 12/30/2019 10:04:20AM The electronic signature indicates that the named Attending Pathologist has evaluated the specimen Technical component performed at [facility], [address], [place], [place] [address] Lab: [phone]  Dir: Rush Farmer, MD, MMM  Professional component performed at [facility], [facility], [place], [place], [place] [address] Lab: [phone]                         [phone] Dir: Dellia Nims.  Rubinas, MD  ADDENDUM: CASE SUMMARY: BREAST BIOMARKER TESTS TEST(S) PERFORMED: Estrogen Receptor (ER) Status: POSITIVE         Percentage of cells with nuclear positivity: Greater than 90%         Average intensity of staining: Strong  Progesterone Receptor (PgR) Status: POSITIVE         Percentage of cells with nuclear positivity: Greater than 90%         Average intensity of staining: Strong  HER2 (by immunohistochemistry): NEGATIVE (Score 0)  Cold Ischemia and Fixation Times: Meet requirements specified in latest version of the ASCO/CAP guidelines Testing Performed on Block Number(s): A1  METHODS Fixative: Formalin Estrogen Receptor:  FDA cleared ([place]) Primary Antibody:  SP1 Progesterone Receptor: FDA cleared ([place]) Primary Antibody: 1E2 HER2 (by IHC): FDA cleared ([place]) Primary Antibody: 4B5 ([place]) Immunohistochemistry controls worked appropriately. Slides were prepared by [facility], [place], [place], and interpreted                          by Quay Burow, MD.  This test was developed and its performance characteristics determined by [facility]. It has not been cleared or approved by the [place] Food and Drug Administration. The FDA does not require this test to go through premarket FDA review. This test is used for clinical purposes. It should not be regarded as investigational or for research. This laboratory is certified under the Clinical Laboratory Improvement Amendments (CLIA) as qualified to perform high complexity clinical laboratory testing.  Addendum #1 performed by Quay Burow, MD.   Electronically signed 01/06/2020 8:42:28AM The electronic signature indicates that the named Attending Pathologist has evaluated the specimen Technical component performed at [facility], [address], [place], [place] [address] Lab: [phone] Dir: Rush Farmer, MD, MMM  Professional component performed at [facility], [facility], [place], [place], [place] [address] Lab: [place]. Rubinas, MD    Assessment:  Tanya Frey is a 74 y.o. female with stage III papillary carcinoma thyroid carcinoma s/p thyroidectomy in 02/21/2017.  Pathology revealed a 4.6 cm papillary thyroid carcinoma with extrathyroidal extension.  There was angioinvasion but no lymphatic invasion.  Margins were negative.  Pathologic stage was pT4a Nx (stage III).  Soft tissue neck CT on 01/11/2017 revealed a 5 cm complex cystic and solid mass arising from the right lobe of the thyroid compatible with known carcinoma. There was no malignant adenopathy.  She received 130.6 mCi I-131 with Thyrogen stimulation on 04/26/2017.  Whole body I-131 scan on 05/04/2017 revealed uptake at thyroid bed consistent with thyroid remnant.  There was no scintigraphic evidence of iodine-avid metastatic thyroid cancer.  Thyroid ultrasound on 08/06/2018 revealed no residual or recurrent tissue post thyroidectomy.  She has chronic swallowing issues post surgery.    She has clinical stage I right breast cancer s/p biopsy on 12/27/2019.  Pathology revealed a 7 mm grade I invasive mammary carcinoma with DCIS. Tissue was ER+ (>90%), PR+ (> 90%), and HER-2 negative (score 0).  Right  diagnostic mammogram on 12/18/2019 revealed an irregular 6 mm mass in the RIGHT upper outer breast. Ultrasound-guided biopsy was recommended. There was no RIGHT axillary adenopathy.   Bone density on 07/05/2016 revealed osteopenia with a T-score of -1.5 in L1-L4 and -13 in the left femoral neck.  Bone density on 08/15/2018 revealed osteopenia in the left femur neck with a T-score of -1.1 in the left femoral neck.  She continued calcium and vitamin D  Symptomatically, she feels "ok".  Breast exam reveals biopsy changes.  Plan: 1.   Labs today: CBC with diff, CMP, CA27.29. 2.   Right breast cancer  Clinical T1N0Mx lesion.   Tumor is ER/PR+ and Her2/neu-.  Discuss  plan for surgery (lumpectomy or mastectomy).   Given small lesion, discuss plans for lumpectomy.  Discuss endocrine therapy.   3.   Stage III thyroid carcinoma  Patient is s/p thyroidectomy 02/2017.  Patient is s/p I-131.  Thyroid ultrasound on 08/06/2018 revealed no residual disease.  Labs on 11/28/2019 were unremarkable:   Thyroglobulin was <2.0, thyroglobulin antibody was <1.0.    Free T4 was 1.30 and TSH was 0.016.  Continue Synthrois 112 mcg/day. 4.   Osteopenia  Bone density on 08/15/2018 confirmed osteopenia.  Continue calcium and vitamin D. 5.   Follow-up as scheduled withDr Pabon tomorrow. 6.   RTC 2 weeks after surgery (patient to call) for MD assessment, review of final pathology, and discussion regarding direction of therapy.  I discussed the assessment and treatment plan with the patient.  The patient was provided an opportunity to ask questions and all were answered.  The patient agreed with the plan and demonstrated an understanding of the instructions.  The patient was advised to call back if the symptoms worsen or if the condition fails to improve as anticipated.    I provided 27 minutes of face-to-face time during this this encounter and > 50% was spent counseling as documented under my assessment and plan.  An additional 10 minutes were spent reviewing her chart (Epic and Care Everywhere) including notes, labs, and imaging studies.    Lequita Asal, MD, PhD    01/07/2020, 3:02 PM   I, Mirian Mo Tufford, am acting as a Education administrator for Lequita Asal, MD.  I, [place] Mike Gip, MD, have reviewed the above documentation for accuracy and completeness, and I agree with the above.

## 2020-01-07 ENCOUNTER — Encounter: Payer: Self-pay | Admitting: Hematology and Oncology

## 2020-01-07 ENCOUNTER — Inpatient Hospital Stay: Payer: Medicare HMO | Attending: Hematology and Oncology | Admitting: Hematology and Oncology

## 2020-01-07 ENCOUNTER — Inpatient Hospital Stay: Payer: Medicare HMO

## 2020-01-07 VITALS — BP 146/77 | HR 70 | Temp 97.0°F | Resp 20 | Wt 158.8 lb

## 2020-01-07 DIAGNOSIS — C50919 Malignant neoplasm of unspecified site of unspecified female breast: Secondary | ICD-10-CM | POA: Insufficient documentation

## 2020-01-07 DIAGNOSIS — M8588 Other specified disorders of bone density and structure, other site: Secondary | ICD-10-CM

## 2020-01-07 DIAGNOSIS — C73 Malignant neoplasm of thyroid gland: Secondary | ICD-10-CM | POA: Diagnosis not present

## 2020-01-07 DIAGNOSIS — C50411 Malignant neoplasm of upper-outer quadrant of right female breast: Secondary | ICD-10-CM | POA: Insufficient documentation

## 2020-01-07 DIAGNOSIS — Z170 Estrogen receptor positive status [ER+]: Secondary | ICD-10-CM

## 2020-01-07 LAB — COMPREHENSIVE METABOLIC PANEL
ALT: 20 U/L (ref 0–44)
AST: 19 U/L (ref 15–41)
Albumin: 4.1 g/dL (ref 3.5–5.0)
Alkaline Phosphatase: 49 U/L (ref 38–126)
Anion gap: 9 (ref 5–15)
BUN: 15 mg/dL (ref 8–23)
CO2: 26 mmol/L (ref 22–32)
Calcium: 8.9 mg/dL (ref 8.9–10.3)
Chloride: 98 mmol/L (ref 98–111)
Creatinine, Ser: 0.59 mg/dL (ref 0.44–1.00)
GFR, Estimated: >60 mL/min (ref >60)
Glucose, Bld: 103 mg/dL — ABNORMAL HIGH (ref 70–99)
Potassium: 4 mmol/L (ref 3.5–5.1)
Sodium: 133 mmol/L — ABNORMAL LOW (ref 135–145)
Total Bilirubin: 0.5 mg/dL (ref 0.3–1.2)
Total Protein: 7.7 g/dL (ref 6.5–8.1)

## 2020-01-07 NOTE — Patient Instructions (Signed)
  Please call office when patient knows surgery date for clinic appointment 2 weeks later.

## 2020-01-08 ENCOUNTER — Other Ambulatory Visit: Payer: Self-pay | Admitting: Surgery

## 2020-01-08 ENCOUNTER — Other Ambulatory Visit: Payer: Self-pay

## 2020-01-08 ENCOUNTER — Encounter: Payer: Self-pay | Admitting: Surgery

## 2020-01-08 ENCOUNTER — Ambulatory Visit: Payer: Medicare HMO | Admitting: Surgery

## 2020-01-08 DIAGNOSIS — C50411 Malignant neoplasm of upper-outer quadrant of right female breast: Secondary | ICD-10-CM

## 2020-01-08 DIAGNOSIS — Z170 Estrogen receptor positive status [ER+]: Secondary | ICD-10-CM

## 2020-01-08 LAB — CANCER ANTIGEN 27.29: CA 27.29: 16.3 U/mL (ref 0.0–38.6)

## 2020-01-08 NOTE — H&P (View-Only) (Signed)
Outpatient Surgical Follow Up  01/08/2020  Tanya Frey is an 74 y.o. female.   Chief Complaint  Patient presents with  . Follow-up     follow up: Right breast invasive mammary carcinoma-discuss surgery    HPI: 74 yo well-known to our practice with a history of bilateral breast biopsy more than 30 years ago for benign pathology.  Now comes in after having a recently mammogram showing a suspicious lesion at 11:00 3 cm from the nipple.  I have personally reviewed the images.  She underwent image guided biopsy and the pathology have been reviewed showing evidence of invasive mammary carcinoma ER/PR positive HER-2 negative 27m. She is able to perform more than 4 METS of activity without any shortness of breath or chest pain. SHe has seen Dr. CMike Gipfrom oncology and she agrees with me about proceeding with surgical intervention upfront.  Patient now wishes to proceed with lumpectomy and sentinel lymph node biopsy.    Past Medical History:  Diagnosis Date  . Anemia   . Difficult intubation   . Diffuse cystic mastopathy   . Family history of malignant neoplasm of breast   . Hyperlipidemia   . Obesity, unspecified   . Personal history of tobacco use, presenting hazards to health   . Thyroid cancer (HCC)    T4a, NX: 4.6 cm lesion with extrathyroidal extension. Received I 131 post procedure.    . Varicose veins     Past Surgical History:  Procedure Laterality Date  . BREAST EXCISIONAL BIOPSY Bilateral 1989   neg  . BREAST SURGERY    . COLONOSCOPY  2011   Dr. EVira Agar colon polyps (tubular adenoma)  . COLONOSCOPY N/A   . COLONOSCOPY WITH PROPOFOL N/A 10/10/2014   Procedure: COLONOSCOPY WITH PROPOFOL;  Surgeon: RManya Silvas MD;  [facility];  Service: Endoscopy;  Laterality: N/A;  . THYROID SURGERY Bilateral   . THYROIDECTOMY N/A 02/21/2017   Procedure: THYROIDECTOMY;  Surgeon: MBeverly Gust MD;  Location: [facility] ORS;  Service: ENT;  Laterality: N/A;  . TUBAL  LIGATION    . VARICOSE VEIN SURGERY    . vein closure procedure Right 2009    Family History  Problem Relation Age of Onset  . Breast cancer Daughter 370      WOctavia HeirBRCA negative    Social History:  reports that she quit smoking about 29 years ago. She has a 10.00 pack-year smoking history. She has never used smokeless tobacco. She reports current alcohol use of about 1.0 - 4.0 standard drink of alcohol per week. She reports that she does not use drugs.  Allergies:  Allergies  Allergen Reactions  . Cefdinir Rash  . Protonix [Pantoprazole Sodium] Anaphylaxis and Other (See Comments)    Medications reviewed.    ROS Full ROS performed and is otherwise negative other than what is stated in HPI   There were no vitals taken for this visit.  Physical Exam Vitals and nursing note reviewed. Exam conducted with a chaperone present.  Constitutional:      General: She is not in acute distress.    Appearance: Normal appearance. She is normal weight.  Eyes:     General: No scleral icterus.       Right eye: No discharge.        Left eye: No discharge.  Cardiovascular:     Rate and Rhythm: Normal rate and regular rhythm.  Pulmonary:     Effort: Pulmonary effort is normal. No respiratory distress.  Breath sounds: Normal breath sounds. No stridor. No wheezing or rhonchi.  Abdominal:     General: Abdomen is flat. There is no distension.     Palpations: Abdomen is soft. There is no mass.     Tenderness: There is no abdominal tenderness.     Hernia: No hernia is present.  Musculoskeletal:        General: No swelling or tenderness. Normal range of motion.     Cervical back: Normal range of motion and neck supple. No rigidity.  Lymphadenopathy:     Cervical: No cervical adenopathy.  Skin:    Capillary Refill: Capillary refill takes less than 2 seconds.  Neurological:     General: No focal deficit present.     Mental Status: She is alert and oriented to person, place,  and time.  Psychiatric:        Mood and Affect: Mood normal.        Behavior: Behavior normal.        Thought Content: Thought content normal.        Judgment: Judgment normal.        Results for orders placed or performed in visit on 01/07/20 (from the past 48 hour(s))  Comprehensive metabolic panel     Status: Abnormal   Collection Time: 01/07/20  3:33 PM  Result Value Ref Range   Sodium 133 (L) 135 - 145 mmol/L   Potassium 4.0 3.5 - 5.1 mmol/L   Chloride 98 98 - 111 mmol/L   CO2 26 22 - 32 mmol/L   Glucose, Bld 103 (H) 70 - 99 mg/dL    Comment: Glucose reference range applies only to samples taken after fasting for at least 8 hours.   BUN 15 8 - 23 mg/dL   Creatinine, Ser 0.59 0.44 - 1.00 mg/dL   Calcium 8.9 8.9 - 10.3 mg/dL   Total Protein 7.7 6.5 - 8.1 g/dL   Albumin 4.1 3.5 - 5.0 g/dL   AST 19 15 - 41 U/L   ALT 20 0 - 44 U/L   Alkaline Phosphatase 49 38 - 126 U/L   Total Bilirubin 0.5 0.3 - 1.2 mg/dL   GFR, Estimated >60 >60 mL/min    Comment: (NOTE) Calculated using the CKD-EPI Creatinine Equation (2021)    Anion gap 9 5 - 15    Comment: Performed at [facility], [address], [place], [place] [address]  Cancer antigen 27.29     Status: None   Collection Time: 01/07/20  3:33 PM  Result Value Ref Range   CA 27.29 16.3 0.0 - 38.6 U/mL    Comment: (NOTE) Siemens Centaur Immunochemiluminometric Methodology (ICMA) Values obtained with different assay methods or kits cannot be used interchangeably. Results cannot be interpreted as absolute evidence of the presence or absence of malignant disease. Performed At: [facility] [place], [place] [id number] Rush Farmer MD FG:[id number]     Assessment/Plan:  1. Malignant neoplasm of upper-outer quadrant of right breast in female, estrogen receptor positive ([place]) DisCussed with the patient in detail about options of lumpectomy versus mastectomy.  She wishes to proceed with  lumpectomy and sentinel lymph node biopsy.  Procedure discussed with the patient in detail.  Risks, benefits possible complications including but not limited to: Bleeding, infection, positive margins and the need of reexcision, breast deformity.  She understands and wished to proceed.  We will tentatively place her on the schedule for next week. extensive counseling provided    Greater  than 50% of the 40 minutes  visit was spent in counseling/coordination of care   Caroleen Hamman, MD [place] Surgeon

## 2020-01-08 NOTE — Progress Notes (Signed)
Outpatient Surgical Follow Up  01/08/2020  Tanya Frey is an 74 y.o. female.   Chief Complaint  Patient presents with  . Follow-up     follow up: Right breast invasive mammary carcinoma-discuss surgery    HPI: 74 yo well-known to our practice with a history of bilateral breast biopsy more than 30 years ago for benign pathology.  Now comes in after having a recently mammogram showing a suspicious lesion at 11:00 3 cm from the nipple.  I have personally reviewed the images.  She underwent image guided biopsy and the pathology have been reviewed showing evidence of invasive mammary carcinoma ER/PR positive HER-2 negative 69m. She is able to perform more than 4 METS of activity without any shortness of breath or chest pain. SHe has seen Dr. CMike Gipfrom oncology and she agrees with me about proceeding with surgical intervention upfront.  Patient now wishes to proceed with lumpectomy and sentinel lymph node biopsy.    Past Medical History:  Diagnosis Date  . Anemia   . Difficult intubation   . Diffuse cystic mastopathy   . Family history of malignant neoplasm of breast   . Hyperlipidemia   . Obesity, unspecified   . Personal history of tobacco use, presenting hazards to health   . Thyroid cancer (HCC)    T4a, NX: 4.6 cm lesion with extrathyroidal extension. Received I 131 post procedure.    . Varicose veins     Past Surgical History:  Procedure Laterality Date  . BREAST EXCISIONAL BIOPSY Bilateral 1989   neg  . BREAST SURGERY    . COLONOSCOPY  2011   Dr. EVira Agar colon polyps (tubular adenoma)  . COLONOSCOPY N/A   . COLONOSCOPY WITH PROPOFOL N/A 10/10/2014   Procedure: COLONOSCOPY WITH PROPOFOL;  Surgeon: RManya Silvas MD;  [facility];  Service: Endoscopy;  Laterality: N/A;  . THYROID SURGERY Bilateral   . THYROIDECTOMY N/A 02/21/2017   Procedure: THYROIDECTOMY;  Surgeon: MBeverly Gust MD;  Location: [facility] ORS;  Service: ENT;  Laterality: N/A;  . TUBAL  LIGATION    . VARICOSE VEIN SURGERY    . vein closure procedure Right 2009    Family History  Problem Relation Age of Onset  . Breast cancer Daughter 332      WOctavia HeirBRCA negative    Social History:  reports that she quit smoking about 29 years ago. She has a 10.00 pack-year smoking history. She has never used smokeless tobacco. She reports current alcohol use of about 1.0 - 4.0 standard drink of alcohol per week. She reports that she does not use drugs.  Allergies:  Allergies  Allergen Reactions  . Cefdinir Rash  . Protonix [Pantoprazole Sodium] Anaphylaxis and Other (See Comments)    Medications reviewed.    ROS Full ROS performed and is otherwise negative other than what is stated in HPI   There were no vitals taken for this visit.  Physical Exam Vitals and nursing note reviewed. Exam conducted with a chaperone present.  Constitutional:      General: She is not in acute distress.    Appearance: Normal appearance. She is normal weight.  Eyes:     General: No scleral icterus.       Right eye: No discharge.        Left eye: No discharge.  Cardiovascular:     Rate and Rhythm: Normal rate and regular rhythm.  Pulmonary:     Effort: Pulmonary effort is normal. No respiratory distress.  Breath sounds: Normal breath sounds. No stridor. No wheezing or rhonchi.  Abdominal:     General: Abdomen is flat. There is no distension.     Palpations: Abdomen is soft. There is no mass.     Tenderness: There is no abdominal tenderness.     Hernia: No hernia is present.  Musculoskeletal:        General: No swelling or tenderness. Normal range of motion.     Cervical back: Normal range of motion and neck supple. No rigidity.  Lymphadenopathy:     Cervical: No cervical adenopathy.  Skin:    Capillary Refill: Capillary refill takes less than 2 seconds.  Neurological:     General: No focal deficit present.     Mental Status: She is alert and oriented to person, place,  and time.  Psychiatric:        Mood and Affect: Mood normal.        Behavior: Behavior normal.        Thought Content: Thought content normal.        Judgment: Judgment normal.        Results for orders placed or performed in visit on 01/07/20 (from the past 48 hour(s))  Comprehensive metabolic panel     Status: Abnormal   Collection Time: 01/07/20  3:33 PM  Result Value Ref Range   Sodium 133 (L) 135 - 145 mmol/L   Potassium 4.0 3.5 - 5.1 mmol/L   Chloride 98 98 - 111 mmol/L   CO2 26 22 - 32 mmol/L   Glucose, Bld 103 (H) 70 - 99 mg/dL    Comment: Glucose reference range applies only to samples taken after fasting for at least 8 hours.   BUN 15 8 - 23 mg/dL   Creatinine, Ser 0.59 0.44 - 1.00 mg/dL   Calcium 8.9 8.9 - 10.3 mg/dL   Total Protein 7.7 6.5 - 8.1 g/dL   Albumin 4.1 3.5 - 5.0 g/dL   AST 19 15 - 41 U/L   ALT 20 0 - 44 U/L   Alkaline Phosphatase 49 38 - 126 U/L   Total Bilirubin 0.5 0.3 - 1.2 mg/dL   GFR, Estimated >60 >60 mL/min    Comment: (NOTE) Calculated using the CKD-EPI Creatinine Equation (2021)    Anion gap 9 5 - 15    Comment: Performed at [facility], [address], [place], [place] [address]  Cancer antigen 27.29     Status: None   Collection Time: 01/07/20  3:33 PM  Result Value Ref Range   CA 27.29 16.3 0.0 - 38.6 U/mL    Comment: (NOTE) Siemens Centaur Immunochemiluminometric Methodology (ICMA) Values obtained with different assay methods or kits cannot be used interchangeably. Results cannot be interpreted as absolute evidence of the presence or absence of malignant disease. Performed At: [facility] [place], [place] [id number] Rush Farmer MD XB:[id number]     Assessment/Plan:  1. Malignant neoplasm of upper-outer quadrant of right breast in female, estrogen receptor positive ([place]) DisCussed with the patient in detail about options of lumpectomy versus mastectomy.  She wishes to proceed with  lumpectomy and sentinel lymph node biopsy.  Procedure discussed with the patient in detail.  Risks, benefits possible complications including but not limited to: Bleeding, infection, positive margins and the need of reexcision, breast deformity.  She understands and wished to proceed.  We will tentatively place her on the schedule for next week. extensive counseling provided    Greater  than 50% of the 40 minutes  visit was spent in counseling/coordination of care   Caroleen Hamman, MD [place] Surgeon

## 2020-01-08 NOTE — Patient Instructions (Signed)
Our surgery schedule will call you with an appointment with surgery date within 24-48 hours. If you do not hear, please call our office. Please have blue sheet with you to write down important information.   Lumpectomy, Care After This sheet gives you information about how to care for yourself after your procedure. Your health care provider may also give you more specific instructions. If you have problems or questions, contact your health care provider. What can I expect after the procedure? After the procedure, it is common to have:  Breast swelling.  Breast tenderness.  Stiffness in your arm or shoulder.  A change in the shape and feel of your breast.  Scar tissue that feels hard to the touch in the area where the lump was removed. Follow these instructions at home: Medicines  Take over-the-counter and prescription medicines only as told by your health care provider.  If you were prescribed an antibiotic medicine, take it as told by your health care provider. Do not stop taking the antibiotic even if you start to feel better.  Ask your health care provider if the medicine prescribed to you: ? Requires you to avoid driving or using heavy machinery. ? Can cause constipation. You may need to take these actions to prevent or treat constipation:  Drink enough fluid to keep your urine pale yellow.  Take over-the-counter or prescription medicines.  Eat foods that are high in fiber, such as beans, whole grains, and fresh fruits and vegetables.  Limit foods that are high in fat and processed sugars, such as fried or sweet foods. Incision care      Follow instructions from your health care provider about how to take care of your incision. Make sure you: ? Wash your hands with soap and water before and after you change your bandage (dressing). If soap and water are not available, use hand sanitizer. ? Change your dressing as told by your health care provider. ? Leave stitches  (sutures), skin glue, or adhesive strips in place. These skin closures may need to stay in place for 2 weeks or longer. If adhesive strip edges start to loosen and curl up, you may trim the loose edges. Do not remove adhesive strips completely unless your health care provider tells you to do that.  Check your incision area every day for signs of infection. Check for: ? More redness, swelling, or pain. ? Fluid or blood. ? Warmth. ? Pus or a bad smell.  Keep your dressing clean and dry.  If you were sent home with a surgical drain in place, follow instructions from your health care provider about emptying it. Bathing  Do not take baths, swim, or use a hot tub until your health care provider approves.  Ask your health care provider if you may take showers. You may only be allowed to take sponge baths. Activity  Rest as told by your health care provider.  Avoid sitting for a long time without moving. Get up to take short walks every 1-2 hours. This is important to improve blood flow and breathing. Ask for help if you feel weak or unsteady.  Return to your normal activities as told by your health care provider. Ask your health care provider what activities are safe for you.  Be careful to avoid any activities that could cause an injury to your arm on the side of your surgery.  Do not lift anything that is heavier than 10 lb (4.5 kg), or the limit that you are told,  until your health care provider says that it is safe. Avoid lifting with the arm that is on the side of your surgery.  Do not carry heavy objects on your shoulder on the side of your surgery.  Do exercises to keep your shoulder and arm from getting stiff and swollen. Talk with your health care provider about which exercises are safe for you. General instructions  Wear a supportive bra as told by your health care provider.  Raise (elevate) your arm above the level of your heart while you are sitting or lying down.  Do not  wear tight jewelry on your arm, wrist, or fingers on the side of your surgery.  Keep all follow-up visits as told by your health care provider. This is important. ? You may need to be screened for extra fluid around the lymph nodes and swelling in the breast and arm (lymphedema). Follow instructions from your health care provider about how often you should be checked.  If you had any lymph nodes removed during your procedure, be sure to tell all of your health care providers. This is important information to share before you are involved in certain procedures, such as having blood tests or having your blood pressure taken. Contact a health care provider if:  You develop a rash.  You have a fever.  Your pain medicine is not working.  You have swelling, weakness, or numbness in your arm that does not improve after a few weeks.  You have new swelling in your breast.  You have any of these signs of infection: ? More redness, swelling, or pain in your incision area. ? Fluid or blood coming from your incision. ? Warmth coming from the incision area. ? Pus or a bad smell coming from your incision. Get help right away if you have:  Very bad pain in your breast or arm.  Swelling in your legs or arms.  Redness, warmth, or pain in your leg or arm.  Chest pain.  Difficulty breathing. Summary  After the procedure, it is common to have breast tenderness, swelling in your breast, and stiffness in your arm and shoulder.  Follow instructions from your health care provider about how to take care of your incision.  Do not lift anything that is heavier than 10 lb (4.5 kg), or the limit that you are told, until your health care provider says that it is safe. Avoid lifting with the arm that is on the side of your surgery.  If you had any lymph nodes removed during your procedure, be sure to tell all of your health care providers. This is important information to share before you are involved in  certain procedures, such as having blood tests or having your blood pressure taken. This information is not intended to replace advice given to you by your health care provider. Make sure you discuss any questions you have with your health care provider. Document Revised: 07/30/2018 Document Reviewed: 07/30/2018 Elsevier Patient Education  [facility].

## 2020-01-08 NOTE — Progress Notes (Signed)
Patient ID: Tanya Frey, female   DOB: 13-May-1945, 74 y.o.   MRN: [id number]  HPI Tanya Frey is a 74 y.o. female well-known to our practice with a history of bilateral breast biopsy more than 30 years ago for benign pathology.  Now comes in after having a recently mammogram showing a suspicious lesion at 11:00 3 cm from the nipple.  I have personally reviewed the images.  This underwent image guided biopsy and the pathology have been reviewed showing evidence of invasive mammary carcinoma ER/PR positive HER-2 negative 68m. She is able to perform more than 4 METS of activity without any shortness of breath or chest pain  HPI  Past Medical History:  Diagnosis Date  . Anemia   . Difficult intubation   . Diffuse cystic mastopathy   . Family history of malignant neoplasm of breast   . Hyperlipidemia   . Obesity, unspecified   . Personal history of tobacco use, presenting hazards to health   . Thyroid cancer (HCC)    T4a, NX: 4.6 cm lesion with extrathyroidal extension. Received I 131 post procedure.    . Varicose veins     Past Surgical History:  Procedure Laterality Date  . BREAST EXCISIONAL BIOPSY Bilateral 1989   neg  . BREAST SURGERY    . COLONOSCOPY  2011   Dr. EVira Agar colon polyps (tubular adenoma)  . COLONOSCOPY N/A   . COLONOSCOPY WITH PROPOFOL N/A 10/10/2014   Procedure: COLONOSCOPY WITH PROPOFOL;  Surgeon: RManya Silvas MD;  [facility];  Service: Endoscopy;  Laterality: N/A;  . THYROID SURGERY Bilateral   . THYROIDECTOMY N/A 02/21/2017   Procedure: THYROIDECTOMY;  Surgeon: MBeverly Gust MD;  Location: [facility] ORS;  Service: ENT;  Laterality: N/A;  . TUBAL LIGATION    . VARICOSE VEIN SURGERY    . vein closure procedure Right 2009    Family History  Problem Relation Age of Onset  . Breast cancer Daughter 374      WOctavia HeirBRCA negative    Social History Social History   Tobacco Use  . Smoking status: Former Smoker    Packs/day:  1.00    Years: 10.00    Pack years: 10.00    Quit date: 02/07/1990    Years since quitting: 29.9  . Smokeless tobacco: Never Used  Vaping Use  . Vaping Use: Never used  Substance Use Topics  . Alcohol use: Yes    Alcohol/week: 1.0 - 4.0 standard drink    Types: 1 - 4 Standard drinks or equivalent per week    Comment: wine on the weekend  . Drug use: No    Allergies  Allergen Reactions  . Cefdinir Rash  . Protonix [Pantoprazole Sodium] Anaphylaxis and Other (See Comments)    Current Outpatient Medications  Medication Sig Dispense Refill  . aspirin EC 81 MG tablet Take 81 mg by mouth daily.    . Calcium-Magnesium-Vitamin D (CALCIUM 1200+D3 PO) Take 1,200 mg by mouth daily.     .Marland Kitchenlevothyroxine (SYNTHROID) 112 MCG tablet Take 1 tablet (112 mcg total) by mouth daily before breakfast. 30 tablet 2  . simvastatin (ZOCOR) 40 MG tablet Take 40 mg by mouth every evening.     .Marland Kitchenlevothyroxine (SYNTHROID) 125 MCG tablet Take 1 tablet (125 mcg total) by mouth daily before breakfast. (Patient not taking: Reported on 01/08/2020) 60 tablet 1  . Multiple Vitamins-Minerals (MULTIVITAMIN WITH MINERALS) tablet Take 1 tablet by mouth daily.     No  current facility-administered medications for this visit.     Review of Systems Full ROS  was asked and was negative except for the information on the HPI  Physical Exam Blood pressure (!) 162/89, pulse 69, temperature 97.8 F (36.6 C), temperature source Oral, height '5\' 6"'  (1.676 m), weight 157 lb 12.8 oz (71.6 kg), SpO2 97 %. CONSTITUTIONAL: NAD. EYES: Pupils are equal, round,, Sclera are non-icteric. EARS, NOSE, MOUTH AND THROAT: Wearing a mask. Hearing is intact to voice. LYMPH NODES:  Lymph nodes in the neck are normal. RESPIRATORY:  Lungs are clear. There is normal respiratory effort, with equal breath sounds bilaterally, and without pathologic use of accessory muscles. BREAST Recent biopsy changes with ecchymosis located on the right breast upper  outer quadrant.  No palpable masses.  No evidence of infection.  No evidence of lymphadenopathy  CARDIOVASCULAR: Heart is regular without murmurs, gallops, or rubs. GI: The abdomen is  soft, nontender, and nondistended. There are no palpable masses. There is no hepatosplenomegaly. There are normal bowel sounds in all quadrants. GU: Rectal deferred.   MUSCULOSKELETAL: Normal muscle strength and tone. No cyanosis or edema.   SKIN: Turgor is good and there are no pathologic skin lesions or ulcers. NEUROLOGIC: Motor and sensation is grossly normal. Cranial nerves are grossly intact. PSYCH:  Oriented to person, place and time. Affect is normal.  Data Reviewed  I have personally reviewed the patient's imaging, laboratory findings and medical records.    Assessment/Plan 74 year old female with newly diagnosed right breast cancer ER/PR positive HER-2 negative.  I was the first provider to discuss with the patient about her cancer diagnosis.  I had an extensive discussion with her regarding her disease process.  We talked about surgical management extensively.  The role of mastectomy versus lumpectomy and axillary lymph node dissection were discussed with her in detail.  I also discussed with her the need for radiation therapy in case she were to choose lumpectomy.  Also discussed with her about hormonal therapy and potential chemotherapy. Patient is not ready to make a definitive decision regarding surgical therapy.  She does have an upcoming appointment tomorrow with Dr. Mike Gip and wishes to discuss further.  Furthermore she wants to talk to her husband about different options.  She is leaning towards lumpectomy but is not certain.  She wishes to make another appointment for further surgical discussion. Also explained to her in detail about postoperative care regarding either lumpectomy and mastectomy.  She seems to  Understand. Please note that I spent 60 minutes in this encounter with greater than 50%  spent in coordination and counseling of her care  Caroleen Hamman, MD FACS General Surgeon 01/08/2020, 3:10 PM

## 2020-01-09 ENCOUNTER — Telehealth: Payer: Self-pay | Admitting: Surgery

## 2020-01-09 ENCOUNTER — Other Ambulatory Visit: Payer: Self-pay

## 2020-01-09 ENCOUNTER — Ambulatory Visit
Admission: RE | Admit: 2020-01-09 | Discharge: 2020-01-09 | Disposition: A | Discharge status: Home / Self Care | Payer: Medicare HMO | Source: Ambulatory Visit | Attending: Surgery | Admitting: Surgery

## 2020-01-09 DIAGNOSIS — C50411 Malignant neoplasm of upper-outer quadrant of right female breast: Secondary | ICD-10-CM | POA: Insufficient documentation

## 2020-01-09 DIAGNOSIS — C50911 Malignant neoplasm of unspecified site of right female breast: Secondary | ICD-10-CM | POA: Diagnosis not present

## 2020-01-09 NOTE — Telephone Encounter (Signed)
Patient has been advised of Pre-Admission date/time, COVID Testing date and Surgery date.  Surgery Date: 01/14/20 Preadmission Testing Date: 01/10/20 (phone 8a-1p) Covid Testing Date: 01/13/20 - patient advised to go to the [facility] between 8a-10:00 am    Patient also reminded of her RF tag to be placed at [facility] on 01/09/20.   Also patient informed to arrive @ 12:00 since she will be having SLN bx prior to surgery with Dr. Dahlia Byes on 01/14/20.  Patient voices understanding.

## 2020-01-10 ENCOUNTER — Encounter
Admission: RE | Admit: 2020-01-10 | Discharge: 2020-01-10 | Disposition: A | Discharge status: Home / Self Care | Payer: Medicare HMO | Source: Ambulatory Visit | Attending: Surgery | Admitting: Surgery

## 2020-01-10 ENCOUNTER — Other Ambulatory Visit: Payer: Self-pay

## 2020-01-10 DIAGNOSIS — Z01810 Encounter for preprocedural cardiovascular examination: Secondary | ICD-10-CM | POA: Diagnosis not present

## 2020-01-10 NOTE — Patient Instructions (Signed)
Your procedure is scheduled on: Tues 12/7 Report to Radiology.  At 12:00  They will bring you to Pre op    Remember: Instructions that are not followed completely may result in serious medical risk,  up to and including death, or upon the discretion of your surgeon and anesthesiologist your  surgery may need to be rescheduled.     _X__ 1. Do not eat food after midnight the night before your procedure.                 No chewing gum or hard candies. You may drink clear liquids up to 2 hours                 before you are scheduled to arrive for your surgery- DO not drink clear                 liquids within 2 hours of the start of your surgery.                 Clear Liquids include:  water, apple juice without pulp, clear Gatorade, G2 or                  Gatorade Zero (avoid Red/Purple/Blue), Black Coffee or Tea (Do not add                 anything to coffee or tea).  __X__2.  On the morning of surgery brush your teeth with toothpaste and water, you                may rinse your mouth with mouthwash if you wish.  Do not swallow any toothpaste of mouthwash.     _X__ 3.  No Alcohol for 24 hours before or after surgery.   ___ 4.  Do Not Smoke or use e-cigarettes For 24 Hours Prior to Your Surgery.                 Do not use any chewable tobacco products for at least 6 hours prior to                 Surgery.  ___  5.  Do not use any recreational drugs (marijuana, cocaine, heroin, ecstasy, MDMA or other)                For at least one week prior to your surgery.  Combination of these drugs with anesthesia                May have life threatening results.  ____  6.  Bring all medications with you on the day of surgery if instructed.   _x___  7.  Notify your doctor if there is any change in your medical condition      (cold, fever, infections).     Do not wear jewelry, make-up, hairpins, clips or nail polish. Do not wear lotions, powders, or perfumes.  Do not shave  48 hours prior to surgery. Do not bring valuables to the hospital.    [facility] is not responsible for any belongings or valuables.  Contacts, dentures or bridgework may not be worn into surgery. Leave your suitcase in the car. After surgery it may be brought to your room. For patients admitted to the hospital, discharge time is determined by your treatment team.   Patients discharged the day of surgery will not be allowed to drive home.   Make arrangements for someone to be with you for the  first 24 hours of your Same Day Discharge.    Please read over the following fact sheets that you were given:    _x___ Take these medicines the morning of surgery with A SIP OF WATER:    1.levothyroxine (SYNTHROID) 112 MCG tablet   2.   3.   4.  5.  6.  ____ Fleet Enema (as directed)   ____ Use CHG Soap (or wipes) as directed  ____ Use Benzoyl Peroxide Gel as instructed  ____ Use inhalers on the day of surgery  ____ Stop metformin 2 days prior to surgery    ____ Take 1/2 of usual insulin dose the night before surgery. No insulin the morning          of surgery.   __x__ Stopped aspirin  Already   1 week ago  _x___ Stop Anti-inflammatories ibuprofen aleve or aspirin products.  May take tylenol   ____ Stop supplements until after surgery.  May continue your vitamins  ____ Bring C-Pap to the hospital.    If you have any questions regarding your pre-procedure instructions,  Please call Pre-admit Testing at [phone] [facility] Pack :  1 part rubbing alcohol 2 parts water in a small Ziploc bag (double bag). Freeze.  Wrap in cloth and apply to breast and under arm for comfort. Wear sports bra day and night for a few days after the surgery. Obtain stool softener for use after the surgery.

## 2020-01-13 ENCOUNTER — Other Ambulatory Visit: Payer: Self-pay

## 2020-01-13 ENCOUNTER — Encounter
Admission: RE | Admit: 2020-01-13 | Discharge: 2020-01-13 | Disposition: A | Discharge status: Home / Self Care | Payer: Medicare HMO | Source: Ambulatory Visit | Attending: Surgery | Admitting: Surgery

## 2020-01-13 ENCOUNTER — Other Ambulatory Visit: Payer: Medicare HMO

## 2020-01-13 DIAGNOSIS — Z01818 Encounter for other preprocedural examination: Secondary | ICD-10-CM | POA: Diagnosis not present

## 2020-01-13 DIAGNOSIS — Z20822 Contact with and (suspected) exposure to covid-19: Secondary | ICD-10-CM | POA: Diagnosis not present

## 2020-01-14 ENCOUNTER — Ambulatory Visit
Admission: RE | Admit: 2020-01-14 | Discharge: 2020-01-14 | Disposition: A | Discharge status: Home / Self Care | Payer: Medicare HMO | Source: Ambulatory Visit | Attending: Surgery | Admitting: Surgery

## 2020-01-14 ENCOUNTER — Encounter
Admission: RE | Disposition: A | Discharge status: Home / Self Care | Payer: Self-pay | Source: Ambulatory Visit | Attending: Surgery

## 2020-01-14 ENCOUNTER — Encounter: Payer: Self-pay | Admitting: Surgery

## 2020-01-14 ENCOUNTER — Ambulatory Visit: Payer: Medicare HMO | Admitting: Urgent Care

## 2020-01-14 DIAGNOSIS — C50411 Malignant neoplasm of upper-outer quadrant of right female breast: Secondary | ICD-10-CM | POA: Insufficient documentation

## 2020-01-14 DIAGNOSIS — Z803 Family history of malignant neoplasm of breast: Secondary | ICD-10-CM | POA: Insufficient documentation

## 2020-01-14 DIAGNOSIS — Z170 Estrogen receptor positive status [ER+]: Secondary | ICD-10-CM | POA: Diagnosis not present

## 2020-01-14 DIAGNOSIS — Z87891 Personal history of nicotine dependence: Secondary | ICD-10-CM | POA: Insufficient documentation

## 2020-01-14 DIAGNOSIS — D0511 Intraductal carcinoma in situ of right breast: Secondary | ICD-10-CM | POA: Diagnosis not present

## 2020-01-14 HISTORY — PX: BREAST LUMPECTOMY,RADIO FREQ LOCALIZER,AXILLARY SENTINEL LYMPH NODE BIOPSY: SHX6900

## 2020-01-14 LAB — SARS CORONAVIRUS 2 (TAT 6-24 HRS): SARS Coronavirus 2: NEGATIVE

## 2020-01-14 SURGERY — BREAST LUMPECTOMY,RADIO FREQ LOCALIZER,AXILLARY SENTINEL LYMPH NODE BIOPSY
Anesthesia: General | Laterality: Right

## 2020-01-14 MED ORDER — LIDOCAINE HCL (CARDIAC) PF 100 MG/5ML IV SOSY
PREFILLED_SYRINGE | INTRAVENOUS | Status: DC | PRN
  Administered 2020-01-14: 80 mg via INTRAVENOUS

## 2020-01-14 MED ORDER — BUPIVACAINE-EPINEPHRINE (PF) 0.25% -1:200000 IJ SOLN
INTRAMUSCULAR | Status: AC
  Filled 2020-01-14: qty 30

## 2020-01-14 MED ORDER — GABAPENTIN 300 MG PO CAPS
ORAL_CAPSULE | ORAL | Status: AC
  Administered 2020-01-14: 300 mg via ORAL
  Filled 2020-01-14: qty 1

## 2020-01-14 MED ORDER — MIDAZOLAM HCL 2 MG/2ML IJ SOLN
INTRAMUSCULAR | Status: DC | PRN
  Administered 2020-01-14: 1 mg via INTRAVENOUS

## 2020-01-14 MED ORDER — OXYCODONE HCL 5 MG PO TABS
5.0000 mg | ORAL_TABLET | Freq: Once | ORAL | Status: AC | PRN
  Administered 2020-01-14: 5 mg via ORAL

## 2020-01-14 MED ORDER — PHENYLEPHRINE HCL (PRESSORS) 10 MG/ML IV SOLN
INTRAVENOUS | Status: DC | PRN
  Administered 2020-01-14 (×3): 100 ug via INTRAVENOUS

## 2020-01-14 MED ORDER — ORAL CARE MOUTH RINSE: 15.0000 mL | Freq: Once | OROMUCOSAL | Status: AC

## 2020-01-14 MED ORDER — CHLORHEXIDINE GLUCONATE 0.12 % MT SOLN: 15.0000 mL | Freq: Once | OROMUCOSAL | Status: AC

## 2020-01-14 MED ORDER — FENTANYL CITRATE (PF) 100 MCG/2ML IJ SOLN: 25.0000 ug | INTRAMUSCULAR | Status: DC | PRN

## 2020-01-14 MED ORDER — FAMOTIDINE 20 MG PO TABS
ORAL_TABLET | ORAL | Status: AC
  Administered 2020-01-14: 20 mg via ORAL
  Filled 2020-01-14: qty 1

## 2020-01-14 MED ORDER — CLINDAMYCIN PHOSPHATE 900 MG/50ML IV SOLN
INTRAVENOUS | Status: AC
  Filled 2020-01-14: qty 50

## 2020-01-14 MED ORDER — OXYCODONE HCL 5 MG/5ML PO SOLN: 5.0000 mg | Freq: Once | ORAL | Status: AC | PRN

## 2020-01-14 MED ORDER — ACETAMINOPHEN 500 MG PO TABS: 1000.0000 mg | ORAL_TABLET | ORAL | Status: AC

## 2020-01-14 MED ORDER — ONDANSETRON HCL 4 MG/2ML IJ SOLN
INTRAMUSCULAR | Status: DC | PRN
  Administered 2020-01-14: 4 mg via INTRAVENOUS

## 2020-01-14 MED ORDER — METHYLENE BLUE 0.5 % INJ SOLN
INTRAVENOUS | Status: AC
  Filled 2020-01-14: qty 10

## 2020-01-14 MED ORDER — CHLORHEXIDINE GLUCONATE CLOTH 2 % EX PADS
6.0000 | MEDICATED_PAD | Freq: Once | CUTANEOUS | Status: AC
  Administered 2020-01-14: 6 via TOPICAL

## 2020-01-14 MED ORDER — PROPOFOL 10 MG/ML IV BOLUS
INTRAVENOUS | Status: AC
  Filled 2020-01-14: qty 20

## 2020-01-14 MED ORDER — FAMOTIDINE 20 MG PO TABS: 20.0000 mg | ORAL_TABLET | Freq: Once | ORAL | Status: AC

## 2020-01-14 MED ORDER — MIDAZOLAM HCL 2 MG/2ML IJ SOLN
INTRAMUSCULAR | Status: AC
  Filled 2020-01-14: qty 2

## 2020-01-14 MED ORDER — FENTANYL CITRATE (PF) 100 MCG/2ML IJ SOLN
INTRAMUSCULAR | Status: AC
  Administered 2020-01-14: 25 ug via INTRAVENOUS
  Filled 2020-01-14: qty 2

## 2020-01-14 MED ORDER — TECHNETIUM TC 99M TILMANOCEPT KIT
1.0000 | PACK | Freq: Once | INTRAVENOUS | Status: AC | PRN
  Administered 2020-01-14: 1.086 via INTRADERMAL

## 2020-01-14 MED ORDER — DEXAMETHASONE SODIUM PHOSPHATE 10 MG/ML IJ SOLN
INTRAMUSCULAR | Status: DC | PRN
  Administered 2020-01-14: 6 mg via INTRAVENOUS

## 2020-01-14 MED ORDER — LACTATED RINGERS IV SOLN: INTRAVENOUS | Status: DC

## 2020-01-14 MED ORDER — CLINDAMYCIN PHOSPHATE 900 MG/50ML IV SOLN
900.0000 mg | INTRAVENOUS | Status: AC
  Administered 2020-01-14: 900 mg via INTRAVENOUS

## 2020-01-14 MED ORDER — METHYLENE BLUE 0.5 % INJ SOLN
INTRAVENOUS | Status: DC | PRN
  Administered 2020-01-14: 5 mL via INTRADERMAL

## 2020-01-14 MED ORDER — CELECOXIB 200 MG PO CAPS: 200.0000 mg | ORAL_CAPSULE | ORAL | Status: AC

## 2020-01-14 MED ORDER — CELECOXIB 200 MG PO CAPS
ORAL_CAPSULE | ORAL | Status: AC
  Administered 2020-01-14: 200 mg via ORAL
  Filled 2020-01-14: qty 1

## 2020-01-14 MED ORDER — ONDANSETRON HCL 4 MG/2ML IJ SOLN: 4.0000 mg | Freq: Once | INTRAMUSCULAR | Status: DC | PRN

## 2020-01-14 MED ORDER — GABAPENTIN 300 MG PO CAPS: 300.0000 mg | ORAL_CAPSULE | ORAL | Status: AC

## 2020-01-14 MED ORDER — DEXAMETHASONE SODIUM PHOSPHATE 10 MG/ML IJ SOLN
INTRAMUSCULAR | Status: AC
  Filled 2020-01-14: qty 1

## 2020-01-14 MED ORDER — PROPOFOL 10 MG/ML IV BOLUS
INTRAVENOUS | Status: DC | PRN
  Administered 2020-01-14: 20 mg via INTRAVENOUS
  Administered 2020-01-14: 120 mg via INTRAVENOUS

## 2020-01-14 MED ORDER — FENTANYL CITRATE (PF) 100 MCG/2ML IJ SOLN
INTRAMUSCULAR | Status: AC
  Filled 2020-01-14: qty 2

## 2020-01-14 MED ORDER — FENTANYL CITRATE (PF) 100 MCG/2ML IJ SOLN
INTRAMUSCULAR | Status: DC | PRN
  Administered 2020-01-14 (×2): 25 ug via INTRAVENOUS

## 2020-01-14 MED ORDER — EPHEDRINE SULFATE 50 MG/ML IJ SOLN
INTRAMUSCULAR | Status: DC | PRN
  Administered 2020-01-14: 5 mg via INTRAVENOUS
  Administered 2020-01-14: 10 mg via INTRAVENOUS
  Administered 2020-01-14: 5 mg via INTRAVENOUS

## 2020-01-14 MED ORDER — ONDANSETRON HCL 4 MG/2ML IJ SOLN
INTRAMUSCULAR | Status: AC
  Filled 2020-01-14: qty 2

## 2020-01-14 MED ORDER — CHLORHEXIDINE GLUCONATE CLOTH 2 % EX PADS: 6.0000 | MEDICATED_PAD | Freq: Once | CUTANEOUS | Status: DC

## 2020-01-14 MED ORDER — CHLORHEXIDINE GLUCONATE 0.12 % MT SOLN
OROMUCOSAL | Status: AC
  Administered 2020-01-14: 15 mL via OROMUCOSAL
  Filled 2020-01-14: qty 15

## 2020-01-14 MED ORDER — OXYCODONE HCL 5 MG PO TABS
ORAL_TABLET | ORAL | Status: AC
  Filled 2020-01-14: qty 1

## 2020-01-14 MED ORDER — ACETAMINOPHEN 500 MG PO TABS
ORAL_TABLET | ORAL | Status: AC
  Administered 2020-01-14: 1000 mg via ORAL
  Filled 2020-01-14: qty 2

## 2020-01-14 MED ORDER — HYDROCODONE-ACETAMINOPHEN 5-325 MG PO TABS: 1.0000 | ORAL_TABLET | ORAL | 0 refills | Status: DC | PRN

## 2020-01-14 MED ORDER — LIDOCAINE HCL (PF) 2 % IJ SOLN
INTRAMUSCULAR | Status: AC
  Filled 2020-01-14: qty 5

## 2020-01-14 SURGICAL SUPPLY — 48 items
ADH SKN CLS APL DERMABOND .7 (GAUZE/BANDAGES/DRESSINGS) ×1
APL PRP STRL LF DISP 70% ISPRP (MISCELLANEOUS) ×1
APPLIER CLIP 9.375 SM OPEN (CLIP) ×3
APR CLP SM 9.3 20 MLT OPN (CLIP) ×1
BLADE SURG 15 STRL LF DISP TIS (BLADE) ×1 IMPLANT
BLADE SURG 15 STRL SS (BLADE) ×3
CANISTER SUCT 1200ML W/VALVE (MISCELLANEOUS) ×3 IMPLANT
CHLORAPREP W/TINT 26 (MISCELLANEOUS) ×3 IMPLANT
CLIP APPLIE 9.375 SM OPEN (CLIP) ×1 IMPLANT
CNTNR SPEC 2.5X3XGRAD LEK (MISCELLANEOUS) ×2
CONT SPEC 4OZ STER OR WHT (MISCELLANEOUS) ×4
CONT SPEC 4OZ STRL OR WHT (MISCELLANEOUS) ×2
CONTAINER SPEC 2.5X3XGRAD LEK (MISCELLANEOUS) ×2 IMPLANT
COVER WAND RF STERILE (DRAPES) ×3 IMPLANT
DERMABOND ADVANCED (GAUZE/BANDAGES/DRESSINGS) ×2
DERMABOND ADVANCED .7 DNX12 (GAUZE/BANDAGES/DRESSINGS) ×1 IMPLANT
DEVICE DUBIN SPECIMEN MAMMOGRA (MISCELLANEOUS) ×3 IMPLANT
DRAPE LAPAROTOMY TRNSV 106X77 (MISCELLANEOUS) ×3 IMPLANT
ELECT CAUTERY BLADE 6.4 (BLADE) ×3 IMPLANT
ELECT REM PT RETURN 9FT ADLT (ELECTROSURGICAL) ×3
ELECTRODE REM PT RTRN 9FT ADLT (ELECTROSURGICAL) ×1 IMPLANT
GLOVE BIO SURGEON STRL SZ7 (GLOVE) ×3 IMPLANT
GLOVE INDICATOR 7.5 STRL GRN (GLOVE) ×3 IMPLANT
GOWN STRL REUS W/ TWL LRG LVL3 (GOWN DISPOSABLE) ×2 IMPLANT
GOWN STRL REUS W/TWL LRG LVL3 (GOWN DISPOSABLE) ×6
KIT MARKER MARGIN INK (KITS) IMPLANT
KIT TURNOVER KIT A (KITS) ×3 IMPLANT
LABEL OR SOLS (LABEL) ×3 IMPLANT
MANIFOLD NEPTUNE II (INSTRUMENTS) ×3 IMPLANT
MARGIN MAP 10MM (MISCELLANEOUS) IMPLANT
MARKER MARGIN CORRECT CLIP (MARKER) IMPLANT
NEEDLE FILTER BLUNT 18X 1/2SAF (NEEDLE) ×2
NEEDLE FILTER BLUNT 18X1 1/2 (NEEDLE) ×1 IMPLANT
NEEDLE HYPO 22GX1.5 SAFETY (NEEDLE) ×3 IMPLANT
PACK BASIN MINOR (MISCELLANEOUS) ×3 IMPLANT
SET LOCALIZER 20 PROBE US (MISCELLANEOUS) IMPLANT
SLEVE PROBE SENORX GAMMA FIND (MISCELLANEOUS) ×3 IMPLANT
SPONGE KITTNER 5P (MISCELLANEOUS) IMPLANT
SPONGE LAP 18X18 RF (DISPOSABLE) ×3 IMPLANT
SUT MNCRL 4-0 (SUTURE) ×6
SUT MNCRL 4-0 27XMFL (SUTURE) ×2
SUT SILK 2 0 SH (SUTURE) IMPLANT
SUT VIC AB 3-0 SH 27 (SUTURE) ×6
SUT VIC AB 3-0 SH 27X BRD (SUTURE) ×2 IMPLANT
SUTURE MNCRL 4-0 27XMF (SUTURE) ×2 IMPLANT
SYR 10ML LL (SYRINGE) ×3 IMPLANT
SYR 20ML LL LF (SYRINGE) ×3 IMPLANT
WATER STERILE IRR 1000ML POUR (IV SOLUTION) ×3 IMPLANT

## 2020-01-14 NOTE — Discharge Instructions (Addendum)
Sentinel Lymph Node Biopsy in Breast Cancer Treatment, Care After This sheet gives you information about how to care for yourself after your procedure. Your health care provider may also give you more specific instructions. If you have problems or questions, contact your health care provider. What can I expect after the procedure? After the procedure, it is common to have:  Blue urine and darker stool for the next 24 hours. This is normal. It is caused by the dye that was used during the procedure.  Blue skin at the injection site. This may last for up to 8 weeks.  Numbness, tingling, or pain near your incision.  Swelling or bruising near your incision.   AMBULATORY SURGERY  DISCHARGE INSTRUCTIONS   1) The drugs that you were given will stay in your system until tomorrow so for the next 24 hours you should not:  A) Drive an automobile B) Make any legal decisions C) Drink any alcoholic beverage   2) You may resume regular meals tomorrow.  Today it is better to start with liquids and gradually work up to solid foods.  You may eat anything you prefer, but it is better to start with liquids, then soup and crackers, and gradually work up to solid foods.   3) Please notify your doctor immediately if you have any unusual bleeding, trouble breathing, redness and pain at the surgery site, drainage, fever, or pain not relieved by medication.  4) Your post-operative visit with Dr.                                     is: Date:                        Time:    Please call to schedule your post-operative visit.  5) Additional Instructions:  Follow these instructions at home: Activity  Avoid activities that take a lot of effort.  Return to your normal activities as told by your health care provider. Ask your health care provider what activities are safe for you. Incision care   Follow instructions from your health care provider about how to take care of your incision. Make sure  you: ? Wash your hands with soap and water for at least 20 seconds before and after you change your bandage (dressing). If soap and water are not available, use hand sanitizer. ? Change your dressing as told by your health care provider. ? Leave stitches (sutures), skin glue, or adhesive strips in place. These skin closures may need to stay in place for 2 weeks or longer. If adhesive strip edges start to loosen and curl up, you may trim the loose edges. Do not remove adhesive strips completely unless your health care provider tells you to do that.  Do not take baths, swim, or use a hot tub until your health care provider approves. Ask your health care provider if you can take showers. You may be able to shower 24 hours after your procedure.  Check your biopsy site every day for signs of infection. Check for: ? More redness, swelling, or pain. ? Fluid or blood. ? Warmth. ? Pus or a bad smell. General instructions  If you were given a surgical bra, wear it for the next 48 hours. You may remove the bra to shower.  Take over-the-counter and prescription medicines only as told by your health care provider.  You  may resume your regular diet.  Do not have your blood pressure taken or have blood drawn from the arm on the side of the biopsy until your health care provider says it is okay.  You may need to be screened for extra fluid around the lymph nodes and swelling in the breast and arm (lymphedema). Follow instructions from your health care provider about how often you should be checked.  Keep all follow-up visits as told by your health care provider. This is important. Contact a health care provider if you have:  Nausea and vomiting.  Any of these signs of infection: ? More redness, swelling, or pain around your biopsy site. ? Fluid or blood coming from your incision. ? Warmth coming from your incision area. ? Pus or a bad smell coming from your incision.  Any new bruising.  Chills  or a fever. Get help right away if you have:  Pain that is getting worse and your medicine is not helping.  Vomiting that will not stop.  Chest pain or trouble breathing. Summary  After the procedure, it is common to have blue urine and darker stool for the next 24 hours. This is normal. It is caused by the dye that was used during the procedure.  Follow instructions from your health care provider about how to take care of your incision.  Do not have your blood pressure taken or have blood drawn from the arm on the side of the biopsy until your health care provider says it is okay.  You may need to be screened for extra fluid around the lymph nodes and swelling in the breast and arm (lymphedema). Follow instructions from your health care provider about how often you should be checked. This information is not intended to replace advice given to you by your health care provider. Make sure you discuss any questions you have with your health care provider. Document Revised: 09/10/2018 Document Reviewed: 09/10/2018 Elsevier Patient Education  [place].

## 2020-01-14 NOTE — Anesthesia Preprocedure Evaluation (Signed)
Anesthesia Evaluation  Patient identified by MRN, date of birth, ID band Patient awake  General Assessment Comment:Difficult airway hx listed in patient chart; patient denies ever being told she was a difficult intubation. Last surgery (thyroidectomy) with Grade II view with McGrath 3.  Reviewed: Allergy & Precautions, H&P , NPO status , Patient's Chart, lab work & pertinent test results  History of Anesthesia Complications Negative for: history of anesthetic complications  Airway Mallampati: III  TM Distance: <3 FB Neck ROM: Full    Dental  (+) Chipped, Poor Dentition   Pulmonary neg shortness of breath, former smoker,    Pulmonary exam normal breath sounds clear to auscultation       Cardiovascular Exercise Tolerance: Good (-) angina(-) Past MI and (-) DOE negative cardio ROS   Rhythm:Regular Rate:Normal - Systolic murmurs    Neuro/Psych negative neurological ROS  negative psych ROS   GI/Hepatic negative GI ROS, Neg liver ROS, neg GERD  ,  Endo/Other  Hypothyroidism   Renal/GU      Musculoskeletal   Abdominal   Peds  Hematology negative hematology ROS (+)   Anesthesia Other Findings Past Medical History: No date: Anemia No date: Diffuse cystic mastopathy No date: Family history of malignant neoplasm of breast No date: Hyperlipidemia No date: Obesity, unspecified No date: Personal history of tobacco use, presenting hazards to health No date: Varicose veins  Past Surgical History: 1989: BREAST EXCISIONAL BIOPSY; Bilateral     Comment:  neg 2011: COLONOSCOPY     Comment:  Dr. [name]; colon polyps (tubular adenoma) 10/10/2014: COLONOSCOPY WITH PROPOFOL; N/A     Comment:  Procedure: COLONOSCOPY WITH PROPOFOL;  Surgeon: [name], MD;  Location: [facility] ENDOSCOPY;  Service:               Endoscopy;  Laterality: N/A; No date: TUBAL LIGATION No date: VARICOSE VEIN SURGERY 2009: vein closure  procedure; Right     Reproductive/Obstetrics negative OB ROS                             Anesthesia Physical  Anesthesia Plan  ASA: II  Anesthesia Plan: General   Post-op Pain Management:    Induction: Intravenous  PONV Risk Score and Plan: 3 and Ondansetron and Dexamethasone  Airway Management Planned: LMA  Additional Equipment: None  Intra-op Plan:   Post-operative Plan: Extubation in OR  Informed Consent: I have reviewed the patients History and Physical, chart, labs and discussed the procedure including the risks, benefits and alternatives for the proposed anesthesia with the patient or authorized representative who has indicated his/her understanding and acceptance.     Dental Advisory Given  Plan Discussed with: Anesthesiologist, CRNA and Surgeon  Anesthesia Plan Comments: (Patient consented for risks of anesthesia including but not limited to:  - adverse reactions to medications - damage to teeth, lips or other oral mucosa - sore throat or hoarseness - Damage to heart, brain, lungs or loss of life  Patient voiced understanding.)        Anesthesia Quick Evaluation

## 2020-01-14 NOTE — Anesthesia Procedure Notes (Signed)
Procedure Name: LMA Insertion Date/Time: 01/14/2020 3:15 AM Performed by: Allean Found, CRNA Pre-anesthesia Checklist: Patient identified, Patient being monitored, Timeout performed, Emergency Drugs available and Suction available Patient Re-evaluated:Patient Re-evaluated prior to induction Oxygen Delivery Method: Circle system utilized Preoxygenation: Pre-oxygenation with 100% oxygen Induction Type: IV induction Ventilation: Mask ventilation without difficulty LMA: LMA inserted LMA Size: 3.0 Tube type: Oral Number of attempts: 1 Placement Confirmation: positive ETCO2 and breath sounds checked- equal and bilateral Tube secured with: Tape Dental Injury: Teeth and Oropharynx as per pre-operative assessment

## 2020-01-14 NOTE — Interval H&P Note (Signed)
History and Physical Interval Note:  01/14/2020 11:49 AM  Tanya Frey  has presented today for surgery, with the diagnosis of Breast cancer.  The various methods of treatment have been discussed with the patient and family. After consideration of risks, benefits and other options for treatment, the patient has consented to  Procedure(s): [place] (Right) as a surgical intervention.  The patient's history has been reviewed, patient examined, no change in status, stable for surgery.  I have reviewed the patient's chart and labs.  Questions were answered to the patient's satisfaction.     [place]

## 2020-01-14 NOTE — Op Note (Signed)
Pre-operative Diagnosis: Right outer upper quadrant ER, PR + Her 2 (-) Breast Cancer    Post-operative Diagnosis: Same   Surgeon: Caroleen Hamman,  MD FACS  Anesthesia: GETA  Procedure: Right Partial mastectomy, RF guided, sentinel node biopsy Breast chest wall advancement flaps encompassing 11x11 cms defect. 121cm2   Findings: Clip and lesion within xray specimen  Estimated Blood Loss: Minimal         Drains: None         Specimens: partial mastectomy , Sentinel node.   gross margins per Pathology 6 mm closest margin ( anterior)      Complications: none                 Condition: Stable  Axillary Lymph Node Dissection Synoptic Operative Report  Operation performed with curative intent:Yes  Resection was performed within the boundaries of the axillary vein, chest wall (serratus anterior), and latissimus dorsi:Yes  Nerves identified and preserved during dissection (select all that apply):Other: I did not perform axillary dissection but a limited sentinel node dissection. Avoiding nerve injuries with extended dissection  Level III nodes were removed:No  Procedure Details  The patient was seen again in the Holding Room. The benefits, complications, treatment options, and expected outcomes were discussed with the patient. The risks of bleeding, infection, recurrence of symptoms, failure to resolve symptoms, hematoma, seroma, open wound, cosmetic deformity, and the need for further surgery were discussed.  The patient was taken to Operating Room, identified as Tanya Frey and the procedure verified.  A Time Out was held and the above information confirmed.  Prior to the induction of general anesthesia, antibiotic prophylaxis was administered. VTE prophylaxis was in place. Appropriate anesthesia was then administered and tolerated well. The chest was prepped with Chloraprep and draped in the sterile fashion. The patient was positioned in the supine position.  Isosulfan blue dye  was injected periareolar early under aseptic conditions.  Using the hand-held probe an area of high counts was identified in the axilla, an incision was made and direction by the probe aided in dissection of a lymph node . I clipped the small lymphatic channels in the standard fashion. A total of 1 nodes that were both hot and blue were excised.   Attention was turned to the  localization site where an incision was made encompassing the needle insertion site using the RF localizer as a guide. Dissection around breast tissue to  perform a partial mastectomy with adequate margins was performed. This was done with electrocautery and sharp dissection. Hemostasis was with electrocautery.   Once assuring that hemostasis was adequate and checked multiple times the wound.   Given that there was significant defect I elected to do tissue advancement flap to fill the void left by the lumpectomy. Using oncoplastics technique the pectoralis was identified and breast tissue was mobilized circumferentially encompassing an area of 11 x 11 cms. The Deep breast flaps were approximated and attached to the pectoralis using multiple 2-0 vicryl sutures. 3-0 vicryl used for the dermis and 4-0 monocryl for the skin. The axillary wound was closed in a two layer fashion fashion.  dermabond was used to coat the skin.  Patient was taken to the recovery room in stable condition  Specimen xray showed excellent margins and clip and lesion within specimen   Caroleen Hamman, MD, FACS

## 2020-01-14 NOTE — Transfer of Care (Signed)
Immediate Anesthesia Transfer of Care Note  Patient: Tanya Frey  Procedure(s) Performed: BREAST LUMPECTOMY,RADIO FREQ LOCALIZER,AXILLARY SENTINEL LYMPH NODE BIOPSY (Right )  Patient Location: PACU  Anesthesia Type:General  Level of Consciousness: awake, alert  and oriented  Airway & Oxygen Therapy: Patient Spontanous Breathing and Patient connected to face mask oxygen  Post-op Assessment: Report given to RN and Post -op Vital signs reviewed and stable  Post vital signs: Reviewed and stable  Last Vitals:  Vitals Value Taken Time  BP 132/64 01/14/20 1500  Temp 36.2 C 01/14/20 1500  Pulse 88 01/14/20 1503  Resp 15 01/14/20 1503  SpO2 100 % 01/14/20 1503  Vitals shown include unvalidated device data.  Last Pain:  Vitals:   01/14/20 1500  PainSc: Asleep         Complications: No complications documented.

## 2020-01-15 NOTE — Anesthesia Postprocedure Evaluation (Signed)
Anesthesia Post Note  Patient: Tanya Frey  Procedure(s) Performed: BREAST LUMPECTOMY,RADIO FREQ LOCALIZER,AXILLARY SENTINEL LYMPH NODE BIOPSY (Right )  Patient location during evaluation: PACU Anesthesia Type: General Level of consciousness: awake and alert Pain management: pain level controlled Vital Signs Assessment: post-procedure vital signs reviewed and stable Respiratory status: spontaneous breathing, nonlabored ventilation, respiratory function stable and patient connected to nasal cannula oxygen Cardiovascular status: blood pressure returned to baseline and stable Postop Assessment: no apparent nausea or vomiting Anesthetic complications: no   No complications documented.   Last Vitals:  Vitals:   01/14/20 1529 01/14/20 1541  BP: 133/73 138/64  Pulse:  82  Resp:  15  Temp: (!) 36.2 C (!) 36.1 C  SpO2:  99%    Last Pain:  Vitals:   01/14/20 1541  TempSrc: Temporal  PainSc: 1                  Arita Miss

## 2020-01-16 ENCOUNTER — Encounter: Payer: Self-pay | Admitting: Surgery

## 2020-01-16 ENCOUNTER — Other Ambulatory Visit: Payer: Self-pay | Admitting: Specialist

## 2020-01-16 ENCOUNTER — Telehealth: Payer: Self-pay | Admitting: Surgery

## 2020-01-16 LAB — SURGICAL PATHOLOGY

## 2020-01-16 NOTE — Telephone Encounter (Signed)
I called pt about her biopsy, She is doing otherwise very well w/o complaints. She is very Patent attorney. F/U with me in 12 days

## 2020-01-29 ENCOUNTER — Ambulatory Visit (INDEPENDENT_AMBULATORY_CARE_PROVIDER_SITE_OTHER): Payer: Medicare HMO | Admitting: Surgery

## 2020-01-29 ENCOUNTER — Encounter: Payer: Self-pay | Admitting: Surgery

## 2020-01-29 ENCOUNTER — Other Ambulatory Visit: Payer: Self-pay

## 2020-01-29 VITALS — BP 124/75 | HR 73 | Temp 97.8°F | Ht 65.0 in | Wt 155.6 lb

## 2020-01-29 DIAGNOSIS — C50411 Malignant neoplasm of upper-outer quadrant of right female breast: Secondary | ICD-10-CM

## 2020-01-29 DIAGNOSIS — Z170 Estrogen receptor positive status [ER+]: Secondary | ICD-10-CM

## 2020-01-29 NOTE — Patient Instructions (Addendum)
We have sent the referral to [place] office at [facility], Someone from their office will contact you to schedule an appointment. You may shower and wash the area as usual.  Please see your follow up appointment listed below.

## 2020-01-31 NOTE — Progress Notes (Signed)
74 year old female status post right lumpectomy for right outer upper quadrant breast cancer ER/PR positive HER-2 negative She has done very well and has no complaints today. Pathology personally reviewed and discussed with the patient in detail.  Negative margins, no met on lymph node   PE NAD incisions healing well without evidence of infection.  No evidence of lymphedema or seromas. Breasts architecture is not significantly distorted due to the advancement flap  A/P right breast cancer status post partial mastectomy she will need radiation therapy.  We will set an appointment with Dr. Baruch Gouty and she will have an upcoming appointment with Mike Gip from oncology. We will see her back in about 3 months or so

## 2020-02-05 ENCOUNTER — Encounter: Payer: Self-pay | Admitting: Radiation Oncology

## 2020-02-06 ENCOUNTER — Ambulatory Visit
Admission: RE | Admit: 2020-02-06 | Discharge: 2020-02-06 | Disposition: A | Discharge status: Home / Self Care | Payer: Medicare HMO | Source: Ambulatory Visit | Attending: Radiation Oncology | Admitting: Radiation Oncology

## 2020-02-06 ENCOUNTER — Other Ambulatory Visit: Payer: Self-pay

## 2020-02-06 ENCOUNTER — Encounter: Payer: Self-pay | Admitting: Radiation Oncology

## 2020-02-06 DIAGNOSIS — C50411 Malignant neoplasm of upper-outer quadrant of right female breast: Secondary | ICD-10-CM | POA: Insufficient documentation

## 2020-02-06 DIAGNOSIS — Z170 Estrogen receptor positive status [ER+]: Secondary | ICD-10-CM | POA: Insufficient documentation

## 2020-02-06 DIAGNOSIS — Z923 Personal history of irradiation: Secondary | ICD-10-CM | POA: Diagnosis not present

## 2020-02-06 DIAGNOSIS — E785 Hyperlipidemia, unspecified: Secondary | ICD-10-CM | POA: Diagnosis not present

## 2020-02-06 DIAGNOSIS — Z803 Family history of malignant neoplasm of breast: Secondary | ICD-10-CM | POA: Insufficient documentation

## 2020-02-06 DIAGNOSIS — Z79899 Other long term (current) drug therapy: Secondary | ICD-10-CM | POA: Insufficient documentation

## 2020-02-06 DIAGNOSIS — Z85850 Personal history of malignant neoplasm of thyroid: Secondary | ICD-10-CM | POA: Insufficient documentation

## 2020-02-06 DIAGNOSIS — Z87891 Personal history of nicotine dependence: Secondary | ICD-10-CM | POA: Diagnosis not present

## 2020-02-06 DIAGNOSIS — E669 Obesity, unspecified: Secondary | ICD-10-CM | POA: Diagnosis not present

## 2020-02-06 NOTE — Consult Note (Signed)
NEW PATIENT EVALUATION  Name: Tanya Frey  MRN: [id number]  Date:   02/06/2020     DOB: 23-Jan-1946   This 74 y.o. female patient presents to the clinic for initial evaluation of stage Ia invasive mammary carcinoma of the right breast (T1b N0 M0) ER/PR positive HER-2/neu negative status post wide local excision and sentinel node biopsy.  REFERRING PHYSICIAN: [name], MD  CHIEF COMPLAINT:  Chief Complaint  Patient presents with  . Breast Cancer    DIAGNOSIS: The encounter diagnosis was Malignant neoplasm of upper-outer quadrant of right breast in female, estrogen receptor positive ([place]).   PREVIOUS INVESTIGATIONS:  Pathology report reviewed Mammograms ultrasound reviewed Clinical notes reviewed  HPI: Patient is a 74 year old female who presented with an abnormal mammogram of her right breast confirmed on ultrasound showing an irregular 6 mm mass in the right upper outer quadrant of the breast.  There was no right axillary adenopathy by ultrasound.  She underwent ultrasound-guided biopsy which was positive for invasive mammary carcinoma.  She underwent a wide local excision and sentinel lymph node biopsy for 7 mm overall grade 1 invasive mammary carcinoma of no special type.  DCIS was present margins were all clear at 6 mm for the invasive component and 3 mm for the DCIS component.  1 sentinel lymph node was negative for metastatic disease.  Tumor was ER/PR positive HER-2/neu not overexpressed.  She has done well postoperatively.  She is a patient of Dr. Mike Gip I have referred her back to Dr. Mike Gip to review her case and make recommendations most likely for antiestrogen therapy.  She specifically denies breast tenderness cough or bone pain.  PLANNED TREATMENT REGIMEN: Hypofractionated right whole breast radiation  PAST MEDICAL HISTORY:  has a past medical history of Anemia, Difficult intubation, Diffuse cystic mastopathy, Family history of malignant neoplasm of breast,  Hyperlipidemia, Obesity, unspecified, Personal history of tobacco use, presenting hazards to health, Thyroid cancer ([place]), and Varicose veins.    PAST SURGICAL HISTORY:  Past Surgical History:  Procedure Laterality Date  . BREAST LUMPECTOMY,RADIO FREQ LOCALIZER,AXILLARY SENTINEL LYMPH NODE BIOPSY Right 01/14/2020   Procedure: BREAST LUMPECTOMY,RADIO FREQ LOCALIZER,AXILLARY SENTINEL LYMPH NODE BIOPSY;  Surgeon: Jules Husbands, MD;  Location: [facility] ORS;  Service: General;  Laterality: Right;  . BREAST SURGERY    . COLONOSCOPY  2011   Dr. [name]; colon polyps (tubular adenoma)  . COLONOSCOPY N/A   . COLONOSCOPY WITH PROPOFOL N/A 10/10/2014   Procedure: COLONOSCOPY WITH PROPOFOL;  Surgeon: [name], MD;  Location: [facility] ENDOSCOPY;  Service: Endoscopy;  Laterality: N/A;  . THYROID SURGERY Bilateral   . THYROIDECTOMY N/A 02/21/2017   Procedure: THYROIDECTOMY;  Surgeon: [name], MD;  Location: [facility] ORS;  Service: ENT;  Laterality: N/A;  . TUBAL LIGATION    . VARICOSE VEIN SURGERY    . vein closure procedure Right 2009    FAMILY HISTORY: family history includes Breast cancer (age of onset: 53) in her daughter.  SOCIAL HISTORY:  reports that she quit smoking about 30 years ago. She has a 10.00 pack-year smoking history. She has never used smokeless tobacco. She reports current alcohol use of about 1.0 - 4.0 standard drink of alcohol per week. She reports that she does not use drugs.  ALLERGIES: Cefdinir and Protonix [pantoprazole sodium]  MEDICATIONS:  Current Outpatient Medications  Medication Sig Dispense Refill  . Calcium-Magnesium-Vitamin D (CALCIUM 1200+D3 PO) Take 1,200 mg by mouth daily.     Marland Kitchen levothyroxine (SYNTHROID) 112 MCG tablet  Take 1 tablet (112 mcg total) by mouth daily before breakfast. 30 tablet 2  . Multiple Vitamins-Minerals (MULTIVITAMIN WITH MINERALS) tablet Take 1 tablet by mouth daily.    . simvastatin (ZOCOR) 40 MG tablet Take 40 mg by mouth every evening.       No current facility-administered medications for this encounter.    ECOG PERFORMANCE STATUS:  0 - Asymptomatic  REVIEW OF SYSTEMS: Patient denies any weight loss, fatigue, weakness, fever, chills or night sweats. Patient denies any loss of vision, blurred vision. Patient denies any ringing  of the ears or hearing loss. No irregular heartbeat. Patient denies heart murmur or history of fainting. Patient denies any chest pain or pain radiating to her upper extremities. Patient denies any shortness of breath, difficulty breathing at night, cough or hemoptysis. Patient denies any swelling in the lower legs. Patient denies any nausea vomiting, vomiting of blood, or coffee ground material in the vomitus. Patient denies any stomach pain. Patient states has had normal bowel movements no significant constipation or diarrhea. Patient denies any dysuria, hematuria or significant nocturia. Patient denies any problems walking, swelling in the joints or loss of balance. Patient denies any skin changes, loss of hair or loss of weight. Patient denies any excessive worrying or anxiety or significant depression. Patient denies any problems with insomnia. Patient denies excessive thirst, polyuria, polydipsia. Patient denies any swollen glands, patient denies easy bruising or easy bleeding. Patient denies any recent infections, allergies or URI. Patient "s visual fields have not changed significantly in recent time.   PHYSICAL EXAM: BP (P) 134/71 (BP Location: Left Arm, Patient Position: Sitting)   Pulse (P) 70   Temp (!) (P) 97.4 F (36.3 C) (Tympanic)   Resp (P) 16   Wt (P) 156 lb 6.4 oz (70.9 kg)   BMI (P) 26.03 kg/m  Right breast has a wide local excision with significant methylene dye near her incision site.  There is some slight swelling of the breast although no dominant mass or nodularity is noted in either breast.  No axillary or supraclavicular adenopathy is identified.  Her sentinel biopsy site is also  healed well.  Well-developed well-nourished patient in NAD. HEENT reveals PERLA, EOMI, discs not visualized.  Oral cavity is clear. No oral mucosal lesions are identified. Neck is clear without evidence of cervical or supraclavicular adenopathy. Lungs are clear to A&P. Cardiac examination is essentially unremarkable with regular rate and rhythm without murmur rub or thrill. Abdomen is benign with no organomegaly or masses noted. Motor sensory and DTR levels are equal and symmetric in the upper and lower extremities. Cranial nerves II through XII are grossly intact. Proprioception is intact. No peripheral adenopathy or edema is identified. No motor or sensory levels are noted. Crude visual fields are within normal range.  LABORATORY DATA: Pathology report reviewed    RADIOLOGY RESULTS: Mammogram and ultrasound reviewed   IMPRESSION: Stage Ia invasive mammary carcinoma of the right breast status post wide local excision and sentinel node biopsy ER/PR positive in 74 year old female  PLAN: At this time I have recommended a hypofractionated course of whole breast radiation would plan on treating over 3 weeks boosting her scar another 1000 cGy using electron beam.  Risks and benefits of treatment including skin reaction fatigue alteration blood counts possible inclusion of superficial lung all were reviewed in detail with the patient and her husband.  I made an appointment to see Dr. Mike Gip again prior to commencing radiation just to rule out possibility of chemotherapy  and discuss antiestrogen therapy.  Patient comprehends my recommendations well.  I would like to take this opportunity to thank you for allowing me to participate in the care of your patient.Noreene Filbert, MD

## 2020-02-10 ENCOUNTER — Inpatient Hospital Stay: Payer: Medicare HMO | Attending: Hematology and Oncology | Admitting: Hematology and Oncology

## 2020-02-10 ENCOUNTER — Telehealth: Payer: Self-pay

## 2020-02-10 ENCOUNTER — Encounter: Payer: Self-pay | Admitting: Hematology and Oncology

## 2020-02-10 ENCOUNTER — Other Ambulatory Visit: Payer: Self-pay

## 2020-02-10 VITALS — BP 161/59 | HR 72 | Temp 96.4°F | Resp 16 | Wt 158.8 lb

## 2020-02-10 DIAGNOSIS — C50411 Malignant neoplasm of upper-outer quadrant of right female breast: Secondary | ICD-10-CM | POA: Diagnosis not present

## 2020-02-10 DIAGNOSIS — Z170 Estrogen receptor positive status [ER+]: Secondary | ICD-10-CM | POA: Insufficient documentation

## 2020-02-10 DIAGNOSIS — C73 Malignant neoplasm of thyroid gland: Secondary | ICD-10-CM | POA: Diagnosis not present

## 2020-02-10 DIAGNOSIS — M8580 Other specified disorders of bone density and structure, unspecified site: Secondary | ICD-10-CM | POA: Diagnosis not present

## 2020-02-10 DIAGNOSIS — M8588 Other specified disorders of bone density and structure, other site: Secondary | ICD-10-CM | POA: Diagnosis not present

## 2020-02-10 NOTE — Progress Notes (Signed)
[facility]  [address], [address] [place], [place] [address] Phone: [phone]  Fax: [phone]   Clinic Day:  02/10/2020  Referring physician: [name], MD  Chief Complaint: Tanya Frey is a 75 y.o. female with stage III papillary carcinoma thyroid and stage IA right breast cancer who is seen for assessment after interval lumpectomy.   HPI:  The patient was last seen in the medical oncology clinic on 01/07/2020. At that time, diagnostic right mammogram revealed an irregular 6 mm mass in the right upper outer quadrant.  Ultrasound-guided biopsy on 12/27/2019 revealed a 7 mm grade I invasive mammary carcinoma with DCIS.  Tumor was ER positive, PR positive and HER-2/neu negative.  We discussed plans for surgery lumpectomy versus mastectomy followed by endocrine therapy.  We discussed radiation if lumpectomy chosen.  She was seen by Dr. Dahlia Byes 01/08/2020.  She was interested in lumpectomy and sentinel lymph node biopsy.  She underwent right partial mastectomy on 01/14/2020.  Pathology revealed a 7 mm grade 1 invasive mammary carcinoma (NOS, ductal).  There was a separate focus of DCIS and intraductal papilloma.  DCIS margins were 3 mm.  Invasive carcinoma margins were 6 mm.  One sentinel lymph node was negative.  Pathologic stage was pT1bpN0.  During the interim, she has been good. She would like to proceed with Oncotype testing prior to initiation of radiation.   Past Medical History:  Diagnosis Date  . Anemia   . Difficult intubation   . Diffuse cystic mastopathy   . Family history of malignant neoplasm of breast   . Hyperlipidemia   . Obesity, unspecified   . Personal history of tobacco use, presenting hazards to health   . Thyroid cancer (HCC)    T4a, NX: 4.6 cm lesion with extrathyroidal extension. Received I 131 post procedure.    . Varicose veins     Past Surgical History:  Procedure Laterality Date  . BREAST LUMPECTOMY,RADIO FREQ  LOCALIZER,AXILLARY SENTINEL LYMPH NODE BIOPSY Right 01/14/2020   Procedure: BREAST LUMPECTOMY,RADIO FREQ LOCALIZER,AXILLARY SENTINEL LYMPH NODE BIOPSY;  Surgeon: Jules Husbands, MD;  Location: [facility] ORS;  Service: General;  Laterality: Right;  . BREAST SURGERY    . COLONOSCOPY  2011   Dr. [name]; colon polyps (tubular adenoma)  . COLONOSCOPY N/A   . COLONOSCOPY WITH PROPOFOL N/A 10/10/2014   Procedure: COLONOSCOPY WITH PROPOFOL;  Surgeon: [name], MD;  Location: [facility] ENDOSCOPY;  Service: Endoscopy;  Laterality: N/A;  . THYROID SURGERY Bilateral   . THYROIDECTOMY N/A 02/21/2017   Procedure: THYROIDECTOMY;  Surgeon: [name], MD;  Location: [facility] ORS;  Service: ENT;  Laterality: N/A;  . TUBAL LIGATION    . VARICOSE VEIN SURGERY    . vein closure procedure Right 2009    Family History  Problem Relation Age of Onset  . Breast cancer Daughter 74       [name] BRCA negative    Social History:  reports that she quit smoking about 30 years ago. She has a 10.00 pack-year smoking history. She has never used smokeless tobacco. She reports current alcohol use of about 1.0 - 4.0 standard drink of alcohol per week. She reports that she does not use drugs.  She is retired from [employer]. She lives in [place].  The patient is accompanied by her husband today.   Allergies:  Allergies  Allergen Reactions  . Cefdinir Rash  . Protonix [Pantoprazole Sodium] Rash    Current Medications: Current Outpatient Medications  Medication Sig Dispense Refill  . Calcium-Magnesium-Vitamin D (CALCIUM 1200+D3 PO) Take 1,200 mg by mouth daily.     Marland Kitchen levothyroxine (SYNTHROID) 112 MCG tablet Take 1 tablet (112 mcg total) by mouth daily before breakfast. 30 tablet 2  . Multiple Vitamins-Minerals (MULTIVITAMIN WITH MINERALS) tablet Take 1 tablet by mouth daily.    . simvastatin (ZOCOR) 40 MG tablet Take 40 mg by mouth every evening.      No current facility-administered medications for  this visit.    Review of Systems  Constitutional: Positive for weight loss (up 2 lbs). Negative for chills, diaphoresis, fever and malaise/fatigue.  HENT: Negative.  Negative for congestion, ear discharge, ear pain, hearing loss, nosebleeds, sinus pain, sore throat and tinnitus.   Eyes: Negative.  Negative for blurred vision and double vision.  Respiratory: Negative.  Negative for cough, hemoptysis, sputum production and shortness of breath.   Cardiovascular: Negative.  Negative for chest pain, palpitations and leg swelling.  Gastrointestinal: Negative.  Negative for abdominal pain, blood in stool, constipation, diarrhea, heartburn, melena, nausea and vomiting.  Genitourinary: Negative.  Negative for dysuria, frequency, hematuria and urgency.  Musculoskeletal: Negative.  Negative for back pain, joint pain, myalgias and neck pain.  Skin: Negative.  Negative for itching and rash.  Neurological: Negative.  Negative for dizziness, tingling, sensory change, weakness and headaches.  Endo/Heme/Allergies: Negative.  Does not bruise/bleed easily.  Psychiatric/Behavioral: Negative.  Negative for depression and memory loss. The patient is not nervous/anxious and does not have insomnia.   All other systems reviewed and are negative.   Performance status (ECOG):  0  Vitals: Blood pressure (!) 161/59, pulse 72, temperature (!) 96.4 F (35.8 C), temperature source Tympanic, resp. rate 16, weight 158 lb 13.5 oz (72 kg), SpO2 100 %.   Physical Exam Vitals and nursing note reviewed.  Constitutional:      General: She is not in acute distress.    Appearance: She is well-developed. She is not diaphoretic.  HENT:     Head: Normocephalic and atraumatic.     Comments: Styled dark hair with slight graying.    Mouth/Throat:     Mouth: Mucous membranes are moist.     Pharynx: Oropharynx is clear. No oropharyngeal exudate.  Eyes:     General: No scleral icterus.    Extraocular Movements: Extraocular  movements intact.     Conjunctiva/sclera: Conjunctivae normal.     Pupils: Pupils are equal, round, and reactive to light.  Neck:     Vascular: No JVD.  Cardiovascular:     Rate and Rhythm: Normal rate and regular rhythm.     Heart sounds: Normal heart sounds. No murmur heard.   Pulmonary:     Effort: Pulmonary effort is normal. No respiratory distress.     Breath sounds: Normal breath sounds. No wheezing or rales.  Chest:     Chest wall: No tenderness.  Breasts:     Right: Swelling and skin change (blue/green dye) present. No tenderness or supraclavicular adenopathy.     Left: No tenderness or supraclavicular adenopathy.      Comments: Lumpectomy incision is well healed. Abdominal:     General: Bowel sounds are normal. There is no distension.     Palpations: Abdomen is soft. There is no mass.     Tenderness: There is no abdominal tenderness. There is no guarding or rebound.  Musculoskeletal:        General: No tenderness. Normal range of motion.     Cervical back: Normal  range of motion and neck supple.  Lymphadenopathy:     Head:     Right side of head: No preauricular, posterior auricular or occipital adenopathy.     Left side of head: No preauricular, posterior auricular or occipital adenopathy.     Cervical: No cervical adenopathy.     Upper Body:     Right upper body: No supraclavicular adenopathy.     Left upper body: No supraclavicular adenopathy.     Lower Body: No right inguinal adenopathy. No left inguinal adenopathy.  Skin:    General: Skin is warm and dry.     Coloration: Skin is not pale.     Findings: No erythema or rash.  Neurological:     Mental Status: She is alert and oriented to person, place, and time.  Psychiatric:        Behavior: Behavior normal.        Thought Content: Thought content normal.        Judgment: Judgment normal.    No visits with results within 3 Day(s) from this visit.  Latest known visit with results is:  Admission on  01/14/2020, Discharged on 01/14/2020  Component Date Value Ref Range Status  . SURGICAL PATHOLOGY 01/14/2020    Final-Edited                   Value:SURGICAL PATHOLOGY CASE: [id number] PATIENT: [name] Surgical Pathology Report  Specimen Submitted: A. Right axillary sentinel nodes B. Right breast, lumpectomy  Clinical History: Breast cancer  DIAGNOSIS: A. LYMPH NODE, SENTINEL, RIGHT AXILLA; BIOPSY: - 1 LYMPH NODE, NEGATIVE FOR METASTATIC CARCINOMA.  B. BREAST, RIGHT; WIDE EXCISION: - INVASIVE MAMMARY CARCINOMA (NOS, DUCTAL), 7 MM IN GREATEST DIMENSION, GRADE 1 OF 3. - SEPARATE FOCUS OF DUCTAL CARCINOMA IN SITU. - INTRADUCTAL PAPILLOMA. - PREVIOUS BIOPSY SITE AND BIOPSY CLIP IDENTIFIED.  CANCER CASE SUMMARY: INVASIVE CARCINOMA OF THE BREAST Standard(s): AJCC-UICC 8  SPECIMEN Procedure: Excision Specimen Laterality: Right  TUMOR Histologic Type: Invasive carcinoma of no special type (ductal) Histologic Grade (Nottingham Histologic Score)                      Glandular (Acinar)/Tubular Differentiation: 2                      Nuclear Pleomorphism: 2                      Mitotic                          Rate: 1                      Overall Grade: 1 Tumor Size: 7 mm Ductal Carcinoma In Situ (DCIS): Present Lymphovascular Invasion: Not identified Treatment Effect in the Breast: No known presurgical therapy  MARGINS Margin Status for Invasive Carcinoma: All margins negative for invasive carcinoma                      Distance from closest margin: 6 mm                      Specify closest margin: Anterior  Margin Status for DCIS: All margins negative for DCIS                      Distance from DCIS to closest margin:  3 mm                      Specify closest margin: Lateral   REGIONAL LYMPH NODES Regional Lymph Node Status: All regional lymph nodes negative for tumor                      Total Number of Lymph Nodes Examined (sentinel and non-sentinel): 1                       Number of Sentinel Nodes Examined: 1  DISTANT METASTASIS Distant Site(s) Involved, if applicable: Not applicable  PATHOLOGIC STAGE CLASSIFICATION (pTNM, AJCC 8th Edition): TNM Descriptors:                          Not applicable pT 1B Regional Lymph Nodes Modifier: SN pN 0 pM - Not applicable  SPECIAL STUDIES Breast Biomarker Testing Performed on Previous Biopsy: ARS-[[id number]]-[[id number]] Estrogen Receptor (ER) Status: POSITIVE         Percentage of cells with nuclear positivity: Greater than 90%         Average intensity of staining: Strong  Progesterone Receptor (PgR) Status: POSITIVE         Percentage of cells with nuclear positivity: Greater than 90%         Average intensity of staining: Strong  HER2 (by immunohistochemistry): NEGATIVE (Score 0)    GROSS DESCRIPTION: A. Labeled: Right axillary sentinel nodes Received: Formalin Tissue fragment(s): 2 Size: 1.6 x 1.2 x 0.5 and 2.5 x 1.6 x 1.5 cm Description: Received are fragments of yellow lobulated adipose tissue. There is a single embedded lymph node candidate, 1.8 x 1.1 x 0.8 cm. The lymph node candidate is serially sectioned and entirely submitted in cassettes 1-2.  Intraoperative Consultation:     Labeled: Lumpecto                         my, right breast     Received: Fresh     Specimen: Breast lumpectomy     Pathologic evaluation performed: Gross margin evaluation     Diagnosis: Gross OR consult - biopsy clip and lesion identified 6 mm from closest (anterior) margin.     Communicated to: Called to Dr. Dahlia Byes at 2:35 PM on 01/14/2020 E. Rodney Cruise M.D.     Tissue submitted: None  B. Labeled: Lumpectomy, right breast Received: Fresh Specimen radiograph image(s) available for review Radiographic findings: A vision clip and radiotracer are identified. Time in fixative: Collected at 2:15 PM on 01/14/2020 and placed in formalin at 2:35 PM on 01/14/2020                                   Cold ischemic time:  Less than 30 minutes Total fixation time: 26.5 hours Type of procedure: Breast lumpectomy Location / laterality of specimen: Right breast Orientation of specimen: The specimen is received previously inked. There is a single suture on the anterior aspect; however, the suture is not designated on the container                          or requisition. Inking: Anterior = green Inferior = blue Lateral = orange Medial = yellow Posterior = black Superior = red Size of specimen: 9.4 (medial to lateral)  x 6.2 (superior to inferior) x 3.3 (anterior to posterior) cm Skin: Not grossly appreciated.  Biopsy site: There is a biopsy site which contains a vision clip.  Number of discrete masses: 1 Size of mass(es): 0.8 x 0.8 x 0.5 cm Description of mass(es):  The mass itself is tan-pink, firm, and stellate and is surrounded by a 1.2 x 1 x 0.8 cm area of tan-yellow, ill-defined, and firm tissue. Distance between masses/clips: The vision clip is embedded within the mass. Margins: Anterior =0.6 cm, posterior =1.9 cm, superior =1.5 cm, inferior = 1.2 cm, lateral =2.1 cm, medial =1.9 cm Description of remainder of tissue: The remainder of the specimen is comprised of yellow lobulated adipose tissue admixed with tan-white focally firm fibrous tissue.  The fat to fibrous tissue ratio is 80:20.  Block summary (t                         he majority of the mass is submitted, approximately 90%): 1 - 4 - lateral margin, perpendicularly sectioned 5 - closest lateral margin to mass 6 - 7 - representative sections of mass (1/cm)       6 - clip site and closest approach to anterior and inferior margins       7 - additional closest approach to anterior and inferior margins 8 - closest approach to posterior margin 9 - closest approach to superior margin 10 - closest medial margin to mass 11 - 13 - lateral margin, perpendicularly sectioned   Final Diagnosis performed by Raynelle Bring, MD.    Electronically signed 01/16/2020 10:22:53AM The electronic signature indicates that the named Attending Pathologist has evaluated the specimen Technical component performed at [facility], [address], [place], [place] [address] Lab: [phone] Dir: Rush Farmer, MD, MMM  Professional component performed at [facility], [facility], [place], [place], [place] [address] Lab: [phone] Dir: Dellia Nims.                          Rubinas, MD      Assessment:  Tanya Frey is a 75 y.o. female with stage III papillary carcinoma thyroid carcinoma s/p thyroidectomy in 02/21/2017.  Pathology revealed a 4.6 cm papillary thyroid carcinoma with extrathyroidal extension.  There was angioinvasion but no lymphatic invasion.  Margins were negative.  Pathologic stage was pT4a Nx (stage III).  Soft tissue neck CT on 01/11/2017 revealed a 5 cm complex cystic and solid mass arising from the right lobe of the thyroid compatible with known carcinoma. There was no malignant adenopathy.  She received 130.6 mCi I-131 with Thyrogen stimulation on 04/26/2017.  Whole body I-131 scan on 05/04/2017 revealed uptake at thyroid bed consistent with thyroid remnant.  There was no scintigraphic evidence of iodine-avid metastatic thyroid cancer.  Thyroid ultrasound on 08/06/2018 revealed no residual or recurrent tissue post thyroidectomy.  She has chronic swallowing issues post surgery.    She has stage IA right breast cancer s/p partial mastectomy on 01/14/2020.  Pathology revealed a 7 mm grade 1 invasive mammary carcinoma (NOS, ductal).  There was a separate focus of DCIS and intraductal papilloma.  DCIS margins were 3 mm; invasive carcinoma margins were 6 mm.  One sentinel lymph node was negative.  Tumor was ER+ (>90%), PR+ (> 90%), and HER-2 negative (score 0).  Pathologic stage was pT1bpN0.  CA27.29 was 16.3 on 01/07/2020.  Right diagnostic mammogram on 12/18/2019 revealed an irregular 6 mm  mass in  the RIGHT upper outer breast. Ultrasound-guided biopsy was recommended. There was no RIGHT axillary adenopathy.  Bone density on 07/05/2016 revealed osteopenia with a T-score of -1.5 in L1-L4 and -13 in the left femoral neck.  Bone density on 08/15/2018 revealed osteopenia in the left femur neck with a T-score of -1.1 in the left femoral neck.  She continued calcium and vitamin D  Symptomatically, she has felt good.  Exam reveals a well healed right breast s/p lumpectomy.  Plan: 1.   Stage IA right breast cancer  She is s/p partial mastectomy on 01/14/2020.   Tumor was grade I and 7 mm.   Tumor is ER/PR+ and Her2/neu-.   Margins were negative.   Sentinel lymph node was negative.  As tumor is > 5 mm, discuss Oncotype DX.   Given features of tumor, suspect low risk and no need for chemotherapy.  Patient interested in Oncotype DX testing- sent.   Await Oncotype DX testing before initiation of radiation  Discuss plan for radiation followed by endocrine therapy.   Discuss tamoxifen versus aromatase inhibitors.   Information provided. 2.   Stage III thyroid carcinoma  Patient is s/p thyroidectomy 02/2017.  Patient is s/p I-131.  Thyroid ultrasound on 08/06/2018 revealed no residual disease.  Labs on 11/28/2019 were unremarkable:   Thyroglobulin was <2.0, thyroglobulin antibody was <1.0.    Free T4 was 1.30 and TSH was 0.016.  Patient on Synthroid 112 mcg/day. 3.   Osteopenia  Bone density on 08/15/2018 confirmed osteopenia.  Continue calcium and vitamin D.  Next bone density study due on 08/14/2020.  Discuss consideration of Prolia if an aromatase inhibitor is initiated. 4.   Oncotype Dx testing requested. 5.   Call patient and Dr Baruch Gouty with the Oncotype Dx results. 6.   RTC after radiation (patient to call) for MD assessment, labs (CBC with diff, CMP), and initiation of endocrine therapy.  I discussed the assessment and treatment plan with the patient.  The patient was provided an  opportunity to ask questions and all were answered.  The patient agreed with the plan and demonstrated an understanding of the instructions.  The patient was advised to call back if the symptoms worsen or if the condition fails to improve as anticipated.     Lequita Asal, MD, PhD    02/10/2020, 1:34 PM   I, Evert Kohl, am acting as a Education administrator for Lequita Asal, MD.  I, Girard Mike Gip, MD, have reviewed the above documentation for accuracy and completeness, and I agree with the above.

## 2020-02-10 NOTE — Patient Instructions (Signed)
Letrozole tablets What is this medicine? LETROZOLE (LET roe zole) blocks the production of estrogen. It is used to treat breast cancer. This medicine may be used for other purposes; ask your health care provider or pharmacist if you have questions. COMMON BRAND NAME(S): Femara What should I tell my health care provider before I take this medicine? They need to know if you have any of these conditions:  high cholesterol  liver disease  osteoporosis (weak bones)  an unusual or allergic reaction to letrozole, other medicines, foods, dyes, or preservatives  pregnant or trying to get pregnant  breast-feeding How should I use this medicine? Take this medicine by mouth with a glass of water. You may take it with or without food. Follow the directions on the prescription label. Take your medicine at regular intervals. Do not take your medicine more often than directed. Do not stop taking except on your doctor's advice. Talk to your pediatrician regarding the use of this medicine in children. Special care may be needed. Overdosage: If you think you have taken too much of this medicine contact a poison control center or emergency room at once. NOTE: This medicine is only for you. Do not share this medicine with others. What if I miss a dose? If you miss a dose, take it as soon as you can. If it is almost time for your next dose, take only that dose. Do not take double or extra doses. What may interact with this medicine? Do not take this medicine with any of the following medications:  estrogens, like hormone replacement therapy or birth control pills This medicine may also interact with the following medications:  dietary supplements such as androstenedione or DHEA  prasterone  tamoxifen This list may not describe all possible interactions. Give your health care provider a list of all the medicines, herbs, non-prescription drugs, or dietary supplements you use. Also tell them if you  smoke, drink alcohol, or use illegal drugs. Some items may interact with your medicine. What should I watch for while using this medicine? Tell your doctor or healthcare professional if your symptoms do not start to get better or if they get worse. Do not become pregnant while taking this medicine or for 3 weeks after stopping it. Women should inform their doctor if they wish to become pregnant or think they might be pregnant. There is a potential for serious side effects to an unborn child. Talk to your health care professional or pharmacist for more information. Do not breast-feed while taking this medicine or for 3 weeks after stopping it. This medicine may interfere with the ability to have a child. Talk with your doctor or health care professional if you are concerned about your fertility. Using this medicine for a long time may increase your risk of low bone mass. Talk to your doctor about bone health. You may get drowsy or dizzy. Do not drive, use machinery, or do anything that needs mental alertness until you know how this medicine affects you. Do not stand or sit up quickly, especially if you are an older patient. This reduces the risk of dizzy or fainting spells. You may need blood work done while you are taking this medicine. What side effects may I notice from receiving this medicine? Side effects that you should report to your doctor or health care professional as soon as possible:  allergic reactions like skin rash, itching, or hives  bone fracture  chest pain  signs and symptoms of a blood clot   such as breathing problems; changes in vision; chest pain; severe, sudden headache; pain, swelling, warmth in the leg; trouble speaking; sudden numbness or weakness of the face, arm or leg  vaginal bleeding Side effects that usually do not require medical attention (report to your doctor or health care professional if they continue or are bothersome):  bone, back, joint, or muscle  pain  dizziness  fatigue  fluid retention  headache  hot flashes, night sweats  nausea  weight gain This list may not describe all possible side effects. Call your doctor for medical advice about side effects. You may report side effects to FDA at 1-800-FDA-1088. Where should I keep my medicine? Keep out of the reach of children. Store between 15 and 30 degrees C (59 and 86 degrees F). Throw away any unused medicine after the expiration date. NOTE: This sheet is a summary. It may not cover all possible information. If you have questions about this medicine, talk to your doctor, pharmacist, or health care provider.  2020 Elsevier/Gold Standard (2015-08-31 11:10:41)   Tamoxifen oral tablet What is this medicine? TAMOXIFEN (ta MOX i fen) blocks the effects of estrogen. It is commonly used to treat breast cancer. It is also used to decrease the chance of breast cancer coming back in women who have received treatment for the disease. It may also help prevent breast cancer in women who have a high risk of developing breast cancer. This medicine may be used for other purposes; ask your health care provider or pharmacist if you have questions. COMMON BRAND NAME(S): Nolvadex What should I tell my health care provider before I take this medicine? They need to know if you have any of these conditions:  blood clots  blood disease  cataracts or impaired eyesight  endometriosis  high calcium levels  high cholesterol  irregular menstrual cycles  liver disease  stroke  uterine fibroids  an unusual reaction to tamoxifen, other medicines, foods, dyes, or preservatives  pregnant or trying to get pregnant  breast-feeding How should I use this medicine? Take this medicine by mouth with a glass of water. Follow the directions on the prescription label. You can take it with or without food. Take your medicine at regular intervals. Do not take your medicine more often than directed.  Do not stop taking except on your doctor's advice. A special MedGuide will be given to you by the pharmacist with each prescription and refill. Be sure to read this information carefully each time. Talk to your pediatrician regarding the use of this medicine in children. While this drug may be prescribed for selected conditions, precautions do apply. Overdosage: If you think you have taken too much of this medicine contact a poison control center or emergency room at once. NOTE: This medicine is only for you. Do not share this medicine with others. What if I miss a dose? If you miss a dose, take it as soon as you can. If it is almost time for your next dose, take only that dose. Do not take double or extra doses. What may interact with this medicine? Do not take this medicine with any of the following medications:  cisapride  certain medicines for irregular heart beat like dronedarone, quinidine  certain medicines for fungal infection like fluconazole, posaconazole  pimozide  saquinavir  thioridazine This medicine may also interact with the following medications:  aminoglutethimide  anastrozole  bromocriptine  chemotherapy drugs  dofetilide  female hormones, like estrogens and birth control pills  letrozole    medroxyprogesterone  phenobarbital  rifampin  warfarin This list may not describe all possible interactions. Give your health care provider a list of all the medicines, herbs, non-prescription drugs, or dietary supplements you use. Also tell them if you smoke, drink alcohol, or use illegal drugs. Some items may interact with your medicine. What should I watch for while using this medicine? Visit your doctor or health care professional for regular checks on your progress. You will need regular pelvic exams, breast exams, and mammograms. If you are taking this medicine to reduce your risk of getting breast cancer, you should know that this medicine does not prevent all  types of breast cancer. If breast cancer or other problems occur, there is no guarantee that it will be found at an early stage. Do not become pregnant while taking this medicine or for 2 months after stopping it. Women should inform their doctor if they wish to become pregnant or think they might be pregnant. There is a potential for serious side effects to an unborn child. Talk to your health care professional or pharmacist for more information. Do not breast-feed an infant while taking this medicine or for 3 months after stopping it. This medicine may interfere with the ability to have a child. Talk with your doctor or health care professional if you are concerned about your fertility. What side effects may I notice from receiving this medicine? Side effects that you should report to your doctor or health care professional as soon as possible:  allergic reactions like skin rash, itching or hives, swelling of the face, lips, or tongue  changes in vision  changes in your menstrual cycle  difficulty walking or talking  new breast lumps  numbness  pelvic pain or pressure  redness, blistering, peeling or loosening of the skin, including inside the mouth  signs and symptoms of a dangerous change in heartbeat or heart rhythm like chest pain, dizziness, fast or irregular heartbeat, palpitations, feeling faint or lightheaded, falls, breathing problems  sudden chest pain  swelling, pain or tenderness in your calf or leg  unusual bruising or bleeding  vaginal discharge that is bloody, brown, or rust  weakness  yellowing of the whites of the eyes or skin Side effects that usually do not require medical attention (report to your doctor or health care professional if they continue or are bothersome):  fatigue  hair loss, although uncommon and is usually mild  headache  hot flashes  impotence (in men)  nausea, vomiting (mild)  vaginal discharge (white or clear) This list may not  describe all possible side effects. Call your doctor for medical advice about side effects. You may report side effects to FDA at 1-800-FDA-1088. Where should I keep my medicine? Keep out of the reach of children. Store at room temperature between 20 and 25 degrees C (68 and 77 degrees F). Protect from light. Keep container tightly closed. Throw away any unused medicine after the expiration date. NOTE: This sheet is a summary. It may not cover all possible information. If you have questions about this medicine, talk to your doctor, pharmacist, or health care provider.  2020 Elsevier/Gold Standard (2018-01-16 11:15:31)  

## 2020-02-13 ENCOUNTER — Ambulatory Visit
Admission: RE | Admit: 2020-02-13 | Discharge: 2020-02-13 | Disposition: A | Discharge status: Home / Self Care | Payer: Medicare HMO | Source: Ambulatory Visit | Attending: Radiation Oncology | Admitting: Radiation Oncology

## 2020-02-13 DIAGNOSIS — C50411 Malignant neoplasm of upper-outer quadrant of right female breast: Secondary | ICD-10-CM | POA: Diagnosis not present

## 2020-02-13 DIAGNOSIS — Z170 Estrogen receptor positive status [ER+]: Secondary | ICD-10-CM | POA: Diagnosis not present

## 2020-02-13 DIAGNOSIS — Z510 Encounter for antineoplastic radiation therapy: Secondary | ICD-10-CM | POA: Diagnosis not present

## 2020-02-14 ENCOUNTER — Other Ambulatory Visit: Payer: Self-pay | Admitting: *Deleted

## 2020-02-14 DIAGNOSIS — C50411 Malignant neoplasm of upper-outer quadrant of right female breast: Secondary | ICD-10-CM

## 2020-02-17 DIAGNOSIS — Z170 Estrogen receptor positive status [ER+]: Secondary | ICD-10-CM | POA: Diagnosis not present

## 2020-02-17 DIAGNOSIS — Z510 Encounter for antineoplastic radiation therapy: Secondary | ICD-10-CM | POA: Diagnosis not present

## 2020-02-17 DIAGNOSIS — C50411 Malignant neoplasm of upper-outer quadrant of right female breast: Secondary | ICD-10-CM | POA: Diagnosis not present

## 2020-02-20 ENCOUNTER — Ambulatory Visit: Admission: RE | Admit: 2020-02-20 | Payer: Medicare HMO | Source: Ambulatory Visit

## 2020-02-20 DIAGNOSIS — Z170 Estrogen receptor positive status [ER+]: Secondary | ICD-10-CM | POA: Diagnosis not present

## 2020-02-20 DIAGNOSIS — Z510 Encounter for antineoplastic radiation therapy: Secondary | ICD-10-CM | POA: Diagnosis not present

## 2020-02-20 DIAGNOSIS — C50411 Malignant neoplasm of upper-outer quadrant of right female breast: Secondary | ICD-10-CM | POA: Diagnosis not present

## 2020-02-24 ENCOUNTER — Ambulatory Visit: Payer: Medicare HMO

## 2020-02-25 ENCOUNTER — Ambulatory Visit
Admission: RE | Admit: 2020-02-25 | Discharge: 2020-02-25 | Disposition: A | Discharge status: Home / Self Care | Payer: Medicare HMO | Source: Ambulatory Visit | Attending: Radiation Oncology | Admitting: Radiation Oncology

## 2020-02-25 DIAGNOSIS — C50411 Malignant neoplasm of upper-outer quadrant of right female breast: Secondary | ICD-10-CM | POA: Diagnosis not present

## 2020-02-25 DIAGNOSIS — Z170 Estrogen receptor positive status [ER+]: Secondary | ICD-10-CM | POA: Diagnosis not present

## 2020-02-25 DIAGNOSIS — Z510 Encounter for antineoplastic radiation therapy: Secondary | ICD-10-CM | POA: Diagnosis not present

## 2020-02-26 ENCOUNTER — Ambulatory Visit
Admission: RE | Admit: 2020-02-26 | Discharge: 2020-02-26 | Disposition: A | Discharge status: Home / Self Care | Payer: Medicare HMO | Source: Ambulatory Visit | Attending: Radiation Oncology | Admitting: Radiation Oncology

## 2020-02-26 DIAGNOSIS — Z510 Encounter for antineoplastic radiation therapy: Secondary | ICD-10-CM | POA: Diagnosis not present

## 2020-02-26 DIAGNOSIS — C50411 Malignant neoplasm of upper-outer quadrant of right female breast: Secondary | ICD-10-CM | POA: Diagnosis not present

## 2020-02-26 DIAGNOSIS — Z170 Estrogen receptor positive status [ER+]: Secondary | ICD-10-CM | POA: Diagnosis not present

## 2020-02-27 ENCOUNTER — Ambulatory Visit
Admission: RE | Admit: 2020-02-27 | Discharge: 2020-02-27 | Disposition: A | Discharge status: Home / Self Care | Payer: Medicare HMO | Source: Ambulatory Visit | Attending: Radiation Oncology | Admitting: Radiation Oncology

## 2020-02-27 ENCOUNTER — Encounter: Payer: Self-pay | Admitting: Hematology and Oncology

## 2020-02-27 DIAGNOSIS — Z510 Encounter for antineoplastic radiation therapy: Secondary | ICD-10-CM | POA: Diagnosis not present

## 2020-02-27 DIAGNOSIS — Z170 Estrogen receptor positive status [ER+]: Secondary | ICD-10-CM | POA: Diagnosis not present

## 2020-02-27 DIAGNOSIS — C50411 Malignant neoplasm of upper-outer quadrant of right female breast: Secondary | ICD-10-CM | POA: Diagnosis not present

## 2020-02-28 ENCOUNTER — Ambulatory Visit
Admission: RE | Admit: 2020-02-28 | Discharge: 2020-02-28 | Disposition: A | Discharge status: Home / Self Care | Payer: Medicare HMO | Source: Ambulatory Visit | Attending: Radiation Oncology | Admitting: Radiation Oncology

## 2020-02-28 ENCOUNTER — Encounter: Payer: Self-pay | Admitting: Hematology and Oncology

## 2020-02-28 DIAGNOSIS — Z510 Encounter for antineoplastic radiation therapy: Secondary | ICD-10-CM | POA: Diagnosis not present

## 2020-02-28 DIAGNOSIS — Z170 Estrogen receptor positive status [ER+]: Secondary | ICD-10-CM | POA: Diagnosis not present

## 2020-02-28 DIAGNOSIS — C50411 Malignant neoplasm of upper-outer quadrant of right female breast: Secondary | ICD-10-CM | POA: Diagnosis not present

## 2020-03-02 ENCOUNTER — Ambulatory Visit
Admission: RE | Admit: 2020-03-02 | Discharge: 2020-03-02 | Disposition: A | Discharge status: Home / Self Care | Payer: Medicare HMO | Source: Ambulatory Visit | Attending: Radiation Oncology | Admitting: Radiation Oncology

## 2020-03-02 DIAGNOSIS — Z510 Encounter for antineoplastic radiation therapy: Secondary | ICD-10-CM | POA: Diagnosis not present

## 2020-03-02 DIAGNOSIS — C50411 Malignant neoplasm of upper-outer quadrant of right female breast: Secondary | ICD-10-CM | POA: Diagnosis not present

## 2020-03-02 DIAGNOSIS — Z170 Estrogen receptor positive status [ER+]: Secondary | ICD-10-CM | POA: Diagnosis not present

## 2020-03-03 ENCOUNTER — Ambulatory Visit
Admission: RE | Admit: 2020-03-03 | Discharge: 2020-03-03 | Disposition: A | Discharge status: Home / Self Care | Payer: Medicare HMO | Source: Ambulatory Visit | Attending: Radiation Oncology | Admitting: Radiation Oncology

## 2020-03-03 DIAGNOSIS — C50411 Malignant neoplasm of upper-outer quadrant of right female breast: Secondary | ICD-10-CM | POA: Diagnosis not present

## 2020-03-03 DIAGNOSIS — Z510 Encounter for antineoplastic radiation therapy: Secondary | ICD-10-CM | POA: Diagnosis not present

## 2020-03-03 DIAGNOSIS — Z170 Estrogen receptor positive status [ER+]: Secondary | ICD-10-CM | POA: Diagnosis not present

## 2020-03-04 ENCOUNTER — Ambulatory Visit
Admission: RE | Admit: 2020-03-04 | Discharge: 2020-03-04 | Disposition: A | Discharge status: Home / Self Care | Payer: Medicare HMO | Source: Ambulatory Visit | Attending: Radiation Oncology | Admitting: Radiation Oncology

## 2020-03-04 DIAGNOSIS — Z510 Encounter for antineoplastic radiation therapy: Secondary | ICD-10-CM | POA: Diagnosis not present

## 2020-03-04 DIAGNOSIS — Z170 Estrogen receptor positive status [ER+]: Secondary | ICD-10-CM | POA: Diagnosis not present

## 2020-03-04 DIAGNOSIS — C50411 Malignant neoplasm of upper-outer quadrant of right female breast: Secondary | ICD-10-CM | POA: Diagnosis not present

## 2020-03-05 ENCOUNTER — Ambulatory Visit
Admission: RE | Admit: 2020-03-05 | Discharge: 2020-03-05 | Disposition: A | Discharge status: Home / Self Care | Payer: Medicare HMO | Source: Ambulatory Visit | Attending: Radiation Oncology | Admitting: Radiation Oncology

## 2020-03-05 DIAGNOSIS — Z170 Estrogen receptor positive status [ER+]: Secondary | ICD-10-CM | POA: Diagnosis not present

## 2020-03-05 DIAGNOSIS — Z510 Encounter for antineoplastic radiation therapy: Secondary | ICD-10-CM | POA: Diagnosis not present

## 2020-03-05 DIAGNOSIS — C50411 Malignant neoplasm of upper-outer quadrant of right female breast: Secondary | ICD-10-CM | POA: Diagnosis not present

## 2020-03-06 ENCOUNTER — Ambulatory Visit: Payer: Medicare HMO

## 2020-03-06 ENCOUNTER — Ambulatory Visit
Admission: RE | Admit: 2020-03-06 | Discharge: 2020-03-06 | Disposition: A | Discharge status: Home / Self Care | Payer: Medicare HMO | Source: Ambulatory Visit | Attending: Radiation Oncology | Admitting: Radiation Oncology

## 2020-03-06 DIAGNOSIS — Z170 Estrogen receptor positive status [ER+]: Secondary | ICD-10-CM | POA: Diagnosis not present

## 2020-03-06 DIAGNOSIS — Z510 Encounter for antineoplastic radiation therapy: Secondary | ICD-10-CM | POA: Diagnosis not present

## 2020-03-06 DIAGNOSIS — C50411 Malignant neoplasm of upper-outer quadrant of right female breast: Secondary | ICD-10-CM | POA: Diagnosis not present

## 2020-03-07 ENCOUNTER — Other Ambulatory Visit: Payer: Self-pay | Admitting: Hematology and Oncology

## 2020-03-07 DIAGNOSIS — C73 Malignant neoplasm of thyroid gland: Secondary | ICD-10-CM

## 2020-03-09 ENCOUNTER — Inpatient Hospital Stay: Payer: Medicare HMO

## 2020-03-09 ENCOUNTER — Ambulatory Visit
Admission: RE | Admit: 2020-03-09 | Discharge: 2020-03-09 | Disposition: A | Discharge status: Home / Self Care | Payer: Medicare HMO | Source: Ambulatory Visit | Attending: Radiation Oncology | Admitting: Radiation Oncology

## 2020-03-09 DIAGNOSIS — M8580 Other specified disorders of bone density and structure, unspecified site: Secondary | ICD-10-CM | POA: Diagnosis not present

## 2020-03-09 DIAGNOSIS — Z170 Estrogen receptor positive status [ER+]: Secondary | ICD-10-CM

## 2020-03-09 DIAGNOSIS — C50411 Malignant neoplasm of upper-outer quadrant of right female breast: Secondary | ICD-10-CM | POA: Diagnosis not present

## 2020-03-09 DIAGNOSIS — Z510 Encounter for antineoplastic radiation therapy: Secondary | ICD-10-CM | POA: Diagnosis not present

## 2020-03-09 DIAGNOSIS — C73 Malignant neoplasm of thyroid gland: Secondary | ICD-10-CM | POA: Diagnosis not present

## 2020-03-09 LAB — CBC
HCT: 40 % (ref 36.0–46.0)
Hemoglobin: 13.5 g/dL (ref 12.0–15.0)
MCH: 28.9 pg (ref 26.0–34.0)
MCHC: 33.8 g/dL (ref 30.0–36.0)
MCV: 85.7 fL (ref 80.0–100.0)
Platelets: 191 10*3/uL (ref 150–400)
RBC: 4.67 MIL/uL (ref 3.87–5.11)
RDW: 14.4 % (ref 11.5–15.5)
WBC: 4.9 10*3/uL (ref 4.0–10.5)
nRBC: 0 % (ref 0.0–0.2)

## 2020-03-10 ENCOUNTER — Ambulatory Visit
Admission: RE | Admit: 2020-03-10 | Discharge: 2020-03-10 | Disposition: A | Discharge status: Home / Self Care | Payer: Medicare HMO | Source: Ambulatory Visit | Attending: Radiation Oncology | Admitting: Radiation Oncology

## 2020-03-10 DIAGNOSIS — Z170 Estrogen receptor positive status [ER+]: Secondary | ICD-10-CM | POA: Diagnosis not present

## 2020-03-10 DIAGNOSIS — C50411 Malignant neoplasm of upper-outer quadrant of right female breast: Secondary | ICD-10-CM | POA: Insufficient documentation

## 2020-03-10 DIAGNOSIS — Z510 Encounter for antineoplastic radiation therapy: Secondary | ICD-10-CM | POA: Insufficient documentation

## 2020-03-11 ENCOUNTER — Ambulatory Visit
Admission: RE | Admit: 2020-03-11 | Discharge: 2020-03-11 | Disposition: A | Discharge status: Home / Self Care | Payer: Medicare HMO | Source: Ambulatory Visit | Attending: Radiation Oncology | Admitting: Radiation Oncology

## 2020-03-11 DIAGNOSIS — Z510 Encounter for antineoplastic radiation therapy: Secondary | ICD-10-CM | POA: Diagnosis not present

## 2020-03-11 DIAGNOSIS — C50411 Malignant neoplasm of upper-outer quadrant of right female breast: Secondary | ICD-10-CM | POA: Diagnosis not present

## 2020-03-11 DIAGNOSIS — Z170 Estrogen receptor positive status [ER+]: Secondary | ICD-10-CM | POA: Diagnosis not present

## 2020-03-12 ENCOUNTER — Ambulatory Visit
Admission: RE | Admit: 2020-03-12 | Discharge: 2020-03-12 | Disposition: A | Discharge status: Home / Self Care | Payer: Medicare HMO | Source: Ambulatory Visit | Attending: Radiation Oncology | Admitting: Radiation Oncology

## 2020-03-12 DIAGNOSIS — Z170 Estrogen receptor positive status [ER+]: Secondary | ICD-10-CM | POA: Diagnosis not present

## 2020-03-12 DIAGNOSIS — Z510 Encounter for antineoplastic radiation therapy: Secondary | ICD-10-CM | POA: Diagnosis not present

## 2020-03-12 DIAGNOSIS — C50411 Malignant neoplasm of upper-outer quadrant of right female breast: Secondary | ICD-10-CM | POA: Diagnosis not present

## 2020-03-13 ENCOUNTER — Ambulatory Visit
Admission: RE | Admit: 2020-03-13 | Discharge: 2020-03-13 | Disposition: A | Discharge status: Home / Self Care | Payer: Medicare HMO | Source: Ambulatory Visit | Attending: Radiation Oncology | Admitting: Radiation Oncology

## 2020-03-13 DIAGNOSIS — M8588 Other specified disorders of bone density and structure, other site: Secondary | ICD-10-CM | POA: Diagnosis present

## 2020-03-13 DIAGNOSIS — Z6831 Body mass index (BMI) 31.0-31.9, adult: Secondary | ICD-10-CM | POA: Diagnosis not present

## 2020-03-13 DIAGNOSIS — C50919 Malignant neoplasm of unspecified site of unspecified female breast: Secondary | ICD-10-CM | POA: Diagnosis not present

## 2020-03-13 DIAGNOSIS — Z79890 Hormone replacement therapy (postmenopausal): Secondary | ICD-10-CM | POA: Diagnosis not present

## 2020-03-13 DIAGNOSIS — W000XXA Fall on same level due to ice and snow, initial encounter: Secondary | ICD-10-CM | POA: Diagnosis present

## 2020-03-13 DIAGNOSIS — R000 Tachycardia, unspecified: Secondary | ICD-10-CM | POA: Diagnosis not present

## 2020-03-13 DIAGNOSIS — Z170 Estrogen receptor positive status [ER+]: Secondary | ICD-10-CM | POA: Diagnosis not present

## 2020-03-13 DIAGNOSIS — E039 Hypothyroidism, unspecified: Secondary | ICD-10-CM | POA: Diagnosis not present

## 2020-03-13 DIAGNOSIS — Z87898 Personal history of other specified conditions: Secondary | ICD-10-CM | POA: Diagnosis not present

## 2020-03-13 DIAGNOSIS — Z888 Allergy status to other drugs, medicaments and biological substances status: Secondary | ICD-10-CM | POA: Diagnosis not present

## 2020-03-13 DIAGNOSIS — Z853 Personal history of malignant neoplasm of breast: Secondary | ICD-10-CM | POA: Diagnosis not present

## 2020-03-13 DIAGNOSIS — I10 Essential (primary) hypertension: Secondary | ICD-10-CM | POA: Diagnosis not present

## 2020-03-13 DIAGNOSIS — C73 Malignant neoplasm of thyroid gland: Secondary | ICD-10-CM | POA: Diagnosis present

## 2020-03-13 DIAGNOSIS — R609 Edema, unspecified: Secondary | ICD-10-CM | POA: Diagnosis not present

## 2020-03-13 DIAGNOSIS — W19XXXA Unspecified fall, initial encounter: Secondary | ICD-10-CM | POA: Diagnosis not present

## 2020-03-13 DIAGNOSIS — R531 Weakness: Secondary | ICD-10-CM | POA: Diagnosis not present

## 2020-03-13 DIAGNOSIS — Z23 Encounter for immunization: Secondary | ICD-10-CM | POA: Diagnosis present

## 2020-03-13 DIAGNOSIS — E785 Hyperlipidemia, unspecified: Secondary | ICD-10-CM | POA: Diagnosis present

## 2020-03-13 DIAGNOSIS — Z803 Family history of malignant neoplasm of breast: Secondary | ICD-10-CM | POA: Diagnosis not present

## 2020-03-13 DIAGNOSIS — Z79899 Other long term (current) drug therapy: Secondary | ICD-10-CM | POA: Diagnosis not present

## 2020-03-13 DIAGNOSIS — D638 Anemia in other chronic diseases classified elsewhere: Secondary | ICD-10-CM | POA: Diagnosis present

## 2020-03-13 DIAGNOSIS — Z419 Encounter for procedure for purposes other than remedying health state, unspecified: Secondary | ICD-10-CM | POA: Diagnosis present

## 2020-03-13 DIAGNOSIS — S8291XD Unspecified fracture of right lower leg, subsequent encounter for closed fracture with routine healing: Secondary | ICD-10-CM | POA: Diagnosis not present

## 2020-03-13 DIAGNOSIS — S82851B Displaced trimalleolar fracture of right lower leg, initial encounter for open fracture type I or II: Secondary | ICD-10-CM | POA: Diagnosis present

## 2020-03-13 DIAGNOSIS — Z87891 Personal history of nicotine dependence: Secondary | ICD-10-CM | POA: Diagnosis not present

## 2020-03-13 DIAGNOSIS — Z881 Allergy status to other antibiotic agents status: Secondary | ICD-10-CM | POA: Diagnosis not present

## 2020-03-13 DIAGNOSIS — C50411 Malignant neoplasm of upper-outer quadrant of right female breast: Secondary | ICD-10-CM | POA: Diagnosis not present

## 2020-03-13 DIAGNOSIS — S82891A Other fracture of right lower leg, initial encounter for closed fracture: Secondary | ICD-10-CM | POA: Diagnosis not present

## 2020-03-13 DIAGNOSIS — E890 Postprocedural hypothyroidism: Secondary | ICD-10-CM | POA: Diagnosis present

## 2020-03-13 DIAGNOSIS — M7989 Other specified soft tissue disorders: Secondary | ICD-10-CM | POA: Diagnosis not present

## 2020-03-13 DIAGNOSIS — S99911A Unspecified injury of right ankle, initial encounter: Secondary | ICD-10-CM | POA: Diagnosis not present

## 2020-03-13 DIAGNOSIS — E669 Obesity, unspecified: Secondary | ICD-10-CM | POA: Diagnosis present

## 2020-03-13 DIAGNOSIS — S82891B Other fracture of right lower leg, initial encounter for open fracture type I or II: Secondary | ICD-10-CM | POA: Diagnosis not present

## 2020-03-13 DIAGNOSIS — Z20822 Contact with and (suspected) exposure to covid-19: Secondary | ICD-10-CM | POA: Diagnosis present

## 2020-03-16 ENCOUNTER — Ambulatory Visit: Payer: Medicare HMO

## 2020-03-16 ENCOUNTER — Inpatient Hospital Stay
Admission: EM | Admit: 2020-03-16 | Discharge: 2020-03-18 | DRG: 494 | Disposition: A | Discharge status: Home Health | Payer: Medicare HMO | Attending: Internal Medicine | Admitting: Internal Medicine

## 2020-03-16 ENCOUNTER — Inpatient Hospital Stay: Payer: Medicare HMO

## 2020-03-16 ENCOUNTER — Inpatient Hospital Stay: Payer: Medicare HMO | Admitting: Certified Registered Nurse Anesthetist

## 2020-03-16 ENCOUNTER — Emergency Department: Payer: Medicare HMO

## 2020-03-16 ENCOUNTER — Other Ambulatory Visit: Payer: Self-pay

## 2020-03-16 ENCOUNTER — Encounter
Admission: EM | Disposition: A | Discharge status: Home Health | Payer: Self-pay | Source: Home / Self Care | Attending: Hospitalist

## 2020-03-16 ENCOUNTER — Encounter: Payer: Self-pay | Admitting: Emergency Medicine

## 2020-03-16 DIAGNOSIS — Z79890 Hormone replacement therapy (postmenopausal): Secondary | ICD-10-CM | POA: Diagnosis not present

## 2020-03-16 DIAGNOSIS — Z853 Personal history of malignant neoplasm of breast: Secondary | ICD-10-CM | POA: Diagnosis not present

## 2020-03-16 DIAGNOSIS — C73 Malignant neoplasm of thyroid gland: Secondary | ICD-10-CM | POA: Diagnosis present

## 2020-03-16 DIAGNOSIS — E039 Hypothyroidism, unspecified: Secondary | ICD-10-CM | POA: Diagnosis not present

## 2020-03-16 DIAGNOSIS — S99911A Unspecified injury of right ankle, initial encounter: Secondary | ICD-10-CM | POA: Diagnosis not present

## 2020-03-16 DIAGNOSIS — S82891B Other fracture of right lower leg, initial encounter for open fracture type I or II: Secondary | ICD-10-CM | POA: Diagnosis not present

## 2020-03-16 DIAGNOSIS — E669 Obesity, unspecified: Secondary | ICD-10-CM | POA: Diagnosis present

## 2020-03-16 DIAGNOSIS — W000XXA Fall on same level due to ice and snow, initial encounter: Secondary | ICD-10-CM | POA: Diagnosis present

## 2020-03-16 DIAGNOSIS — R000 Tachycardia, unspecified: Secondary | ICD-10-CM | POA: Diagnosis not present

## 2020-03-16 DIAGNOSIS — Z6831 Body mass index (BMI) 31.0-31.9, adult: Secondary | ICD-10-CM | POA: Diagnosis not present

## 2020-03-16 DIAGNOSIS — Z419 Encounter for procedure for purposes other than remedying health state, unspecified: Secondary | ICD-10-CM | POA: Diagnosis present

## 2020-03-16 DIAGNOSIS — R609 Edema, unspecified: Secondary | ICD-10-CM | POA: Diagnosis not present

## 2020-03-16 DIAGNOSIS — E890 Postprocedural hypothyroidism: Secondary | ICD-10-CM | POA: Diagnosis present

## 2020-03-16 DIAGNOSIS — S82851B Displaced trimalleolar fracture of right lower leg, initial encounter for open fracture type I or II: Principal | ICD-10-CM | POA: Diagnosis present

## 2020-03-16 DIAGNOSIS — Z888 Allergy status to other drugs, medicaments and biological substances status: Secondary | ICD-10-CM | POA: Diagnosis not present

## 2020-03-16 DIAGNOSIS — Z803 Family history of malignant neoplasm of breast: Secondary | ICD-10-CM | POA: Diagnosis not present

## 2020-03-16 DIAGNOSIS — E785 Hyperlipidemia, unspecified: Secondary | ICD-10-CM | POA: Diagnosis present

## 2020-03-16 DIAGNOSIS — Z79899 Other long term (current) drug therapy: Secondary | ICD-10-CM | POA: Diagnosis not present

## 2020-03-16 DIAGNOSIS — D638 Anemia in other chronic diseases classified elsewhere: Secondary | ICD-10-CM | POA: Diagnosis present

## 2020-03-16 DIAGNOSIS — Z23 Encounter for immunization: Secondary | ICD-10-CM | POA: Diagnosis present

## 2020-03-16 DIAGNOSIS — Z20822 Contact with and (suspected) exposure to covid-19: Secondary | ICD-10-CM | POA: Diagnosis present

## 2020-03-16 DIAGNOSIS — Z87891 Personal history of nicotine dependence: Secondary | ICD-10-CM

## 2020-03-16 DIAGNOSIS — Z881 Allergy status to other antibiotic agents status: Secondary | ICD-10-CM

## 2020-03-16 DIAGNOSIS — C50919 Malignant neoplasm of unspecified site of unspecified female breast: Secondary | ICD-10-CM

## 2020-03-16 DIAGNOSIS — Z87898 Personal history of other specified conditions: Secondary | ICD-10-CM

## 2020-03-16 DIAGNOSIS — W19XXXA Unspecified fall, initial encounter: Secondary | ICD-10-CM | POA: Diagnosis not present

## 2020-03-16 DIAGNOSIS — I10 Essential (primary) hypertension: Secondary | ICD-10-CM | POA: Diagnosis not present

## 2020-03-16 DIAGNOSIS — M8588 Other specified disorders of bone density and structure, other site: Secondary | ICD-10-CM | POA: Diagnosis present

## 2020-03-16 HISTORY — PX: ORIF ANKLE FRACTURE: SHX5408

## 2020-03-16 LAB — CBC WITH DIFFERENTIAL/PLATELET
Abs Immature Granulocytes: 0.03 10*3/uL (ref 0.00–0.07)
Basophils Absolute: 0 10*3/uL (ref 0.0–0.1)
Basophils Relative: 1 %
Eosinophils Absolute: 0 10*3/uL (ref 0.0–0.5)
Eosinophils Relative: 0 %
HCT: 42.2 % (ref 36.0–46.0)
Hemoglobin: 13.7 g/dL (ref 12.0–15.0)
Immature Granulocytes: 0 %
Lymphocytes Relative: 16 %
Lymphs Abs: 1.1 10*3/uL (ref 0.7–4.0)
MCH: 28.4 pg (ref 26.0–34.0)
MCHC: 32.5 g/dL (ref 30.0–36.0)
MCV: 87.6 fL (ref 80.0–100.0)
Monocytes Absolute: 0.5 10*3/uL (ref 0.1–1.0)
Monocytes Relative: 7 %
Neutro Abs: 5.3 10*3/uL (ref 1.7–7.7)
Neutrophils Relative %: 76 %
Platelets: 198 10*3/uL (ref 150–400)
RBC: 4.82 MIL/uL (ref 3.87–5.11)
RDW: 14.5 % (ref 11.5–15.5)
WBC: 6.9 10*3/uL (ref 4.0–10.5)
nRBC: 0 % (ref 0.0–0.2)

## 2020-03-16 LAB — BASIC METABOLIC PANEL
Anion gap: 9 (ref 5–15)
BUN: 12 mg/dL (ref 8–23)
CO2: 26 mmol/L (ref 22–32)
Calcium: 9.4 mg/dL (ref 8.9–10.3)
Chloride: 103 mmol/L (ref 98–111)
Creatinine, Ser: 0.61 mg/dL (ref 0.44–1.00)
GFR, Estimated: >60 mL/min (ref >60)
Glucose, Bld: 121 mg/dL — ABNORMAL HIGH (ref 70–99)
Potassium: 4.2 mmol/L (ref 3.5–5.1)
Sodium: 138 mmol/L (ref 135–145)

## 2020-03-16 LAB — SARS CORONAVIRUS 2 BY RT PCR (HOSPITAL ORDER, PERFORMED IN ~~LOC~~ HOSPITAL LAB): SARS Coronavirus 2: NEGATIVE

## 2020-03-16 SURGERY — OPEN REDUCTION INTERNAL FIXATION (ORIF) ANKLE FRACTURE
Anesthesia: General | Site: Ankle | Laterality: Right

## 2020-03-16 MED ORDER — EPHEDRINE SULFATE 50 MG/ML IJ SOLN
INTRAMUSCULAR | Status: DC | PRN
  Administered 2020-03-16 (×2): 10 mg via INTRAVENOUS
  Administered 2020-03-16 (×4): 5 mg via INTRAVENOUS

## 2020-03-16 MED ORDER — BISACODYL 10 MG RE SUPP
10.0000 mg | Freq: Every day | RECTAL | Status: DC | PRN
  Filled 2020-03-16: qty 1

## 2020-03-16 MED ORDER — OXYCODONE HCL 5 MG/5ML PO SOLN: 5.0000 mg | Freq: Once | ORAL | Status: DC | PRN

## 2020-03-16 MED ORDER — CEFAZOLIN SODIUM-DEXTROSE 2-4 GM/100ML-% IV SOLN
2.0000 g | Freq: Four times a day (QID) | INTRAVENOUS | Status: AC
Start: 2020-03-16 — End: 2020-03-17
  Administered 2020-03-16 – 2020-03-17 (×3): 2 g via INTRAVENOUS
  Filled 2020-03-16 (×4): qty 100

## 2020-03-16 MED ORDER — MIDAZOLAM HCL 2 MG/2ML IJ SOLN
INTRAMUSCULAR | Status: DC | PRN
  Administered 2020-03-16: 1 mg via INTRAVENOUS

## 2020-03-16 MED ORDER — ONDANSETRON HCL 4 MG/2ML IJ SOLN: 4.0000 mg | Freq: Four times a day (QID) | INTRAMUSCULAR | Status: DC | PRN

## 2020-03-16 MED ORDER — FLEET ENEMA 7-19 GM/118ML RE ENEM: 1.0000 | ENEMA | Freq: Once | RECTAL | Status: DC | PRN

## 2020-03-16 MED ORDER — LIDOCAINE HCL (PF) 2 % IJ SOLN
INTRAMUSCULAR | Status: AC
  Filled 2020-03-16: qty 5

## 2020-03-16 MED ORDER — SIMVASTATIN 20 MG PO TABS
40.0000 mg | ORAL_TABLET | Freq: Every evening | ORAL | Status: DC
Start: 2020-03-16 — End: 2020-03-18
  Administered 2020-03-16 – 2020-03-17 (×2): 40 mg via ORAL
  Filled 2020-03-16 (×2): qty 2

## 2020-03-16 MED ORDER — DEXAMETHASONE SODIUM PHOSPHATE 10 MG/ML IJ SOLN
INTRAMUSCULAR | Status: AC
  Filled 2020-03-16: qty 1

## 2020-03-16 MED ORDER — METHOCARBAMOL 1000 MG/10ML IJ SOLN
500.0000 mg | Freq: Four times a day (QID) | INTRAVENOUS | Status: DC | PRN
  Filled 2020-03-16: qty 5

## 2020-03-16 MED ORDER — LIDOCAINE HCL (CARDIAC) PF 100 MG/5ML IV SOSY
PREFILLED_SYRINGE | INTRAVENOUS | Status: DC | PRN
  Administered 2020-03-16: 80 mg via INTRAVENOUS

## 2020-03-16 MED ORDER — ONDANSETRON HCL 4 MG PO TABS: 4.0000 mg | ORAL_TABLET | Freq: Four times a day (QID) | ORAL | Status: DC | PRN

## 2020-03-16 MED ORDER — PROPOFOL 10 MG/ML IV BOLUS
INTRAVENOUS | Status: AC
  Filled 2020-03-16: qty 40

## 2020-03-16 MED ORDER — ONDANSETRON HCL 4 MG/2ML IJ SOLN
INTRAMUSCULAR | Status: AC
  Filled 2020-03-16: qty 2

## 2020-03-16 MED ORDER — OXYCODONE HCL 5 MG PO TABS: 5.0000 mg | ORAL_TABLET | Freq: Once | ORAL | Status: DC | PRN

## 2020-03-16 MED ORDER — CEFAZOLIN SODIUM-DEXTROSE 2-4 GM/100ML-% IV SOLN
2.0000 g | Freq: Once | INTRAVENOUS | Status: AC
  Administered 2020-03-16: 2 g via INTRAVENOUS

## 2020-03-16 MED ORDER — ADULT MULTIVITAMIN W/MINERALS CH
1.0000 | ORAL_TABLET | Freq: Every day | ORAL | Status: DC
  Administered 2020-03-17 – 2020-03-18 (×2): 1 via ORAL
  Filled 2020-03-16 (×2): qty 1

## 2020-03-16 MED ORDER — SODIUM CHLORIDE 0.9 % IV SOLN: INTRAVENOUS | Status: DC

## 2020-03-16 MED ORDER — ENOXAPARIN SODIUM 40 MG/0.4ML ~~LOC~~ SOLN
40.0000 mg | SUBCUTANEOUS | Status: DC
  Administered 2020-03-17 – 2020-03-18 (×2): 40 mg via SUBCUTANEOUS
  Filled 2020-03-16 (×2): qty 0.4

## 2020-03-16 MED ORDER — ZOLPIDEM TARTRATE 5 MG PO TABS
5.0000 mg | ORAL_TABLET | Freq: Every evening | ORAL | Status: DC | PRN
  Administered 2020-03-16: 5 mg via ORAL
  Filled 2020-03-16: qty 1

## 2020-03-16 MED ORDER — METOCLOPRAMIDE HCL 5 MG/ML IJ SOLN: 5.0000 mg | Freq: Three times a day (TID) | INTRAMUSCULAR | Status: DC | PRN

## 2020-03-16 MED ORDER — LACTATED RINGERS IV SOLN: INTRAVENOUS | Status: DC | PRN

## 2020-03-16 MED ORDER — ONDANSETRON HCL 4 MG/2ML IJ SOLN: 4.0000 mg | Freq: Once | INTRAMUSCULAR | Status: DC | PRN

## 2020-03-16 MED ORDER — MIDAZOLAM HCL 2 MG/2ML IJ SOLN
INTRAMUSCULAR | Status: AC
  Filled 2020-03-16: qty 2

## 2020-03-16 MED ORDER — FENTANYL CITRATE (PF) 100 MCG/2ML IJ SOLN
INTRAMUSCULAR | Status: AC
  Filled 2020-03-16: qty 2

## 2020-03-16 MED ORDER — TRAMADOL HCL 50 MG PO TABS: 50.0000 mg | ORAL_TABLET | Freq: Four times a day (QID) | ORAL | Status: DC | PRN

## 2020-03-16 MED ORDER — DEXAMETHASONE SODIUM PHOSPHATE 10 MG/ML IJ SOLN
INTRAMUSCULAR | Status: DC | PRN
  Administered 2020-03-16: 5 mg via INTRAVENOUS

## 2020-03-16 MED ORDER — BUPIVACAINE HCL 0.5 % IJ SOLN
INTRAMUSCULAR | Status: DC | PRN
  Administered 2020-03-16: 20 mL

## 2020-03-16 MED ORDER — TETANUS-DIPHTH-ACELL PERTUSSIS 5-2.5-18.5 LF-MCG/0.5 IM SUSY
0.5000 mL | PREFILLED_SYRINGE | Freq: Once | INTRAMUSCULAR | Status: AC
  Administered 2020-03-18: 0.5 mL via INTRAMUSCULAR
  Filled 2020-03-16: qty 0.5

## 2020-03-16 MED ORDER — DIPHENHYDRAMINE HCL 12.5 MG/5ML PO ELIX
12.5000 mg | ORAL_SOLUTION | ORAL | Status: DC | PRN
  Filled 2020-03-16: qty 10

## 2020-03-16 MED ORDER — PROPOFOL 10 MG/ML IV BOLUS
INTRAVENOUS | Status: DC | PRN
  Administered 2020-03-16: 100 mg via INTRAVENOUS

## 2020-03-16 MED ORDER — METOCLOPRAMIDE HCL 10 MG PO TABS: 5.0000 mg | ORAL_TABLET | Freq: Three times a day (TID) | ORAL | Status: DC | PRN

## 2020-03-16 MED ORDER — MAGNESIUM HYDROXIDE 400 MG/5ML PO SUSP: 30.0000 mL | Freq: Every day | ORAL | Status: DC | PRN

## 2020-03-16 MED ORDER — ACETAMINOPHEN 325 MG PO TABS
325.0000 mg | ORAL_TABLET | Freq: Four times a day (QID) | ORAL | Status: DC | PRN
  Administered 2020-03-18: 650 mg via ORAL
  Filled 2020-03-16: qty 2

## 2020-03-16 MED ORDER — LEVOTHYROXINE SODIUM 112 MCG PO TABS
112.0000 ug | ORAL_TABLET | Freq: Every day | ORAL | Status: DC
  Administered 2020-03-17 – 2020-03-18 (×2): 112 ug via ORAL
  Filled 2020-03-16 (×2): qty 1

## 2020-03-16 MED ORDER — ACETAMINOPHEN 500 MG PO TABS
500.0000 mg | ORAL_TABLET | Freq: Four times a day (QID) | ORAL | Status: AC
  Administered 2020-03-16 – 2020-03-17 (×3): 500 mg via ORAL
  Filled 2020-03-16 (×4): qty 1

## 2020-03-16 MED ORDER — FENTANYL CITRATE (PF) 100 MCG/2ML IJ SOLN
100.0000 ug | Freq: Once | INTRAMUSCULAR | Status: AC
  Administered 2020-03-16: 100 ug via INTRAVENOUS
  Filled 2020-03-16: qty 2

## 2020-03-16 MED ORDER — HYDROCODONE-ACETAMINOPHEN 5-325 MG PO TABS: 1.0000 | ORAL_TABLET | ORAL | Status: DC | PRN

## 2020-03-16 MED ORDER — PHENYLEPHRINE HCL (PRESSORS) 10 MG/ML IV SOLN
INTRAVENOUS | Status: DC | PRN
  Administered 2020-03-16: 50 ug via INTRAVENOUS
  Administered 2020-03-16: 100 ug via INTRAVENOUS
  Administered 2020-03-16: 50 ug via INTRAVENOUS
  Administered 2020-03-16: 100 ug via INTRAVENOUS

## 2020-03-16 MED ORDER — ONDANSETRON HCL 4 MG/2ML IJ SOLN
INTRAMUSCULAR | Status: DC | PRN
  Administered 2020-03-16: 4 mg via INTRAVENOUS

## 2020-03-16 MED ORDER — FENTANYL CITRATE (PF) 100 MCG/2ML IJ SOLN
INTRAMUSCULAR | Status: DC | PRN
  Administered 2020-03-16: 25 ug via INTRAVENOUS
  Administered 2020-03-16: 50 ug via INTRAVENOUS
  Administered 2020-03-16: 25 ug via INTRAVENOUS
  Administered 2020-03-16 (×2): 12.5 ug via INTRAVENOUS

## 2020-03-16 MED ORDER — FENTANYL CITRATE (PF) 100 MCG/2ML IJ SOLN: 25.0000 ug | INTRAMUSCULAR | Status: DC | PRN

## 2020-03-16 MED ORDER — VANCOMYCIN HCL IN DEXTROSE 1-5 GM/200ML-% IV SOLN
1000.0000 mg | Freq: Two times a day (BID) | INTRAVENOUS | Status: DC
  Filled 2020-03-16: qty 200

## 2020-03-16 MED ORDER — VANCOMYCIN HCL IN DEXTROSE 1-5 GM/200ML-% IV SOLN: 1000.0000 mg | Freq: Once | INTRAVENOUS | Status: DC

## 2020-03-16 MED ORDER — MORPHINE SULFATE (PF) 2 MG/ML IV SOLN: 2.0000 mg | INTRAVENOUS | Status: DC | PRN

## 2020-03-16 MED ORDER — DOCUSATE SODIUM 100 MG PO CAPS
100.0000 mg | ORAL_CAPSULE | Freq: Two times a day (BID) | ORAL | Status: DC
  Administered 2020-03-16 – 2020-03-18 (×4): 100 mg via ORAL
  Filled 2020-03-16 (×4): qty 1

## 2020-03-16 MED ORDER — ACETAMINOPHEN 10 MG/ML IV SOLN: 1000.0000 mg | Freq: Once | INTRAVENOUS | Status: DC | PRN

## 2020-03-16 SURGICAL SUPPLY — 61 items
BIT DRILL 2.5X2.75 QC CALB (BIT) ×2 IMPLANT
BIT DRILL 2.9 CANN QC NONSTRL (BIT) ×2 IMPLANT
BIT DRILL CALIBRATED 2.7 (BIT) ×2 IMPLANT
BLADE SURG SZ10 CARB STEEL (BLADE) ×4 IMPLANT
BNDG COHESIVE 4X5 TAN STRL (GAUZE/BANDAGES/DRESSINGS) ×2 IMPLANT
BNDG ELASTIC 4X5.8 VLCR STR LF (GAUZE/BANDAGES/DRESSINGS) ×4 IMPLANT
BNDG ELASTIC 6X5.8 VLCR STR LF (GAUZE/BANDAGES/DRESSINGS) ×2 IMPLANT
BNDG ESMARK 4X12 TAN STRL LF (GAUZE/BANDAGES/DRESSINGS) ×2 IMPLANT
BNDG ESMARK 6X12 TAN STRL LF (GAUZE/BANDAGES/DRESSINGS) ×2 IMPLANT
BNDG PLASTER FAST 4X5 WHT LF (CAST SUPPLIES) ×8 IMPLANT
CANISTER SUCT 1200ML W/VALVE (MISCELLANEOUS) ×2 IMPLANT
CHLORAPREP W/TINT 26 (MISCELLANEOUS) ×4 IMPLANT
COVER WAND RF STERILE (DRAPES) ×2 IMPLANT
CUFF TOURN SGL QUICK 24 (TOURNIQUET CUFF)
CUFF TOURN SGL QUICK 30 (TOURNIQUET CUFF)
CUFF TRNQT CYL 24X4X16.5-23 (TOURNIQUET CUFF) IMPLANT
CUFF TRNQT CYL 30X4X21-28X (TOURNIQUET CUFF) IMPLANT
DRAPE C-ARM XRAY 36X54 (DRAPES) ×2 IMPLANT
DRAPE C-ARMOR (DRAPES) ×2 IMPLANT
DRAPE INCISE IOBAN 66X45 STRL (DRAPES) ×2 IMPLANT
DRAPE SPLIT 6X30 W/TAPE (DRAPES) ×2 IMPLANT
DRAPE U-SHAPE 47X51 STRL (DRAPES) ×2 IMPLANT
ELECT CAUTERY BLADE 6.4 (BLADE) ×2 IMPLANT
ELECT REM PT RETURN 9FT ADLT (ELECTROSURGICAL) ×2
ELECTRODE REM PT RTRN 9FT ADLT (ELECTROSURGICAL) ×1 IMPLANT
GAUZE SPONGE 4X4 12PLY STRL (GAUZE/BANDAGES/DRESSINGS) ×2 IMPLANT
GAUZE XEROFORM 1X8 LF (GAUZE/BANDAGES/DRESSINGS) ×2 IMPLANT
GLOVE BIO SURGEON STRL SZ8 (GLOVE) ×4 IMPLANT
GLOVE INDICATOR 8.0 STRL GRN (GLOVE) ×2 IMPLANT
GOWN STRL REUS W/ TWL LRG LVL3 (GOWN DISPOSABLE) ×1 IMPLANT
GOWN STRL REUS W/ TWL XL LVL3 (GOWN DISPOSABLE) ×1 IMPLANT
GOWN STRL REUS W/TWL LRG LVL3 (GOWN DISPOSABLE) ×1
GOWN STRL REUS W/TWL XL LVL3 (GOWN DISPOSABLE) ×1
HEMOVAC 400ML (MISCELLANEOUS) ×2
K-WIRE ACE 1.6X6 (WIRE) ×8
KIT DRAIN HEMOVAC JP 7FR 400ML (MISCELLANEOUS) ×1 IMPLANT
KIT TURNOVER KIT A (KITS) ×2 IMPLANT
KWIRE ACE 1.6X6 (WIRE) ×4 IMPLANT
LABEL OR SOLS (LABEL) ×2 IMPLANT
MANIFOLD NEPTUNE II (INSTRUMENTS) ×2 IMPLANT
NS IRRIG 1000ML POUR BTL (IV SOLUTION) ×2 IMPLANT
PACK EXTREMITY ARMC (MISCELLANEOUS) ×2 IMPLANT
PAD ABD DERMACEA PRESS 5X9 (GAUZE/BANDAGES/DRESSINGS) ×4 IMPLANT
PAD CAST CTTN 4X4 STRL (SOFTGOODS) ×2 IMPLANT
PAD PREP 24X41 OB/GYN DISP (PERSONAL CARE ITEMS) ×2 IMPLANT
PADDING CAST COTTON 4X4 STRL (SOFTGOODS) ×2
PLATE LOCK 3H 95 RT DIST FIB (Plate) ×2 IMPLANT
SCREW ACE CAN 4.0 40M (Screw) ×2 IMPLANT
SCREW LOCK CORT STAR 3.5X10 (Screw) ×8 IMPLANT
SCREW LOCK CORT STAR 3.5X12 (Screw) ×4 IMPLANT
SCREW NON LOCKING LP 3.5 14MM (Screw) ×6 IMPLANT
SCREW NON LOCKING LP 3.5 16MM (Screw) ×2 IMPLANT
SPONGE LAP 18X18 RF (DISPOSABLE) ×2 IMPLANT
STAPLER SKIN PROX 35W (STAPLE) ×2 IMPLANT
STOCKINETTE IMPERV 14X48 (MISCELLANEOUS) ×2 IMPLANT
SUT VIC AB 0 CT1 36 (SUTURE) ×2 IMPLANT
SUT VIC AB 2-0 SH 27 (SUTURE) ×2
SUT VIC AB 2-0 SH 27XBRD (SUTURE) ×2 IMPLANT
SUT VIC AB 3-0 SH 27 (SUTURE) ×1
SUT VIC AB 3-0 SH 27X BRD (SUTURE) ×1 IMPLANT
SYR 10ML LL (SYRINGE) ×2 IMPLANT

## 2020-03-16 NOTE — Progress Notes (Signed)
   03/16/20 1648  Vitals  Temp 98.5 F (36.9 C)  BP (!) 141/76  MAP (mmHg) 94  BP Location Left Arm  BP Method Automatic  Patient Position (if appropriate) Lying  Pulse Rate (!) 109  Pulse Rate Source Monitor  Resp 17   Pt arrived to room 146. Pt A&Ox4. Call bell within reach. Pt dtr at bedside.

## 2020-03-16 NOTE — Consult Note (Signed)
ORTHOPAEDIC CONSULTATION  REQUESTING PHYSICIAN: Collier Bullock, MD  Chief Complaint:   Right ankle pain.  History of Present Illness: Tanya Frey is a 75 y.o. female with a history of thyroid cancer, obesity, hyperlipidemia, and anemia who normally lives independently.  The patient was in her usual state of health this morning when she apparently slipped on her icy steps while leaving her house, injuring her ankle.  She was brought to the emergency room where x-rays demonstrated a trimalleolar fracture dislocation of the ankle.  Examination by the ER provider also suggested that she may have an open component to the injury due to an abrasion over the medial aspect of the ankle.  The ankle was reduced by the ER provider and placed into a posterior splint.  The patient denies any associated injuries.  She did not strike her head or lose consciousness.  The patient also denies any lightheadedness, dizziness, chest pain, shortness of breath, or other symptoms which may have precipitated her fall.  The patient denies any prior problems with the right ankle.  However, she is actively receiving radiation treatment for her thyroid cancer.  Past Medical History:  Diagnosis Date  . Anemia   . Difficult intubation   . Diffuse cystic mastopathy   . Family history of malignant neoplasm of breast   . Hyperlipidemia   . Obesity, unspecified   . Personal history of tobacco use, presenting hazards to health   . Thyroid cancer (HCC)    T4a, NX: 4.6 cm lesion with extrathyroidal extension. Received I 131 post procedure.    . Varicose veins    Past Surgical History:  Procedure Laterality Date  . BREAST LUMPECTOMY,RADIO FREQ LOCALIZER,AXILLARY SENTINEL LYMPH NODE BIOPSY Right 01/14/2020   Procedure: BREAST LUMPECTOMY,RADIO FREQ LOCALIZER,AXILLARY SENTINEL LYMPH NODE BIOPSY;  Surgeon: Jules Husbands, MD;  Location: [facility] ORS;  Service:  General;  Laterality: Right;  . BREAST SURGERY    . COLONOSCOPY  2011   Dr. [name]; colon polyps (tubular adenoma)  . COLONOSCOPY N/A   . COLONOSCOPY WITH PROPOFOL N/A 10/10/2014   Procedure: COLONOSCOPY WITH PROPOFOL;  Surgeon: [name], MD;  Location: [facility];  Service: Endoscopy;  Laterality: N/A;  . THYROID SURGERY Bilateral   . THYROIDECTOMY N/A 02/21/2017   Procedure: THYROIDECTOMY;  Surgeon: [name], MD;  Location: [facility] ORS;  Service: ENT;  Laterality: N/A;  . TUBAL LIGATION    . VARICOSE VEIN SURGERY    . vein closure procedure Right 2009   Social History   Socioeconomic History  . Marital status: Married    Spouse name: Not on file  . Number of children: Not on file  . Years of education: Not on file  . Highest education level: Not on file  Occupational History  . Not on file  Tobacco Use  . Smoking status: Former Smoker    Packs/day: 1.00    Years: 10.00    Pack years: 10.00    Quit date: 02/07/1990    Years since quitting: 30.1  . Smokeless tobacco: Never Used  Vaping Use  . Vaping Use: Never used  Substance and Sexual Activity  . Alcohol use: Yes    Alcohol/week: 1.0 - 4.0 standard drink    Types: 1 - 4 Standard drinks or equivalent per week    Comment: wine on the weekend  . Drug use: No  . Sexual activity: Not on file  Other Topics Concern  . Not on file  Social History Narrative  .  Not on file   Social Determinants of Health   Financial Resource Strain: Not on file  Food Insecurity: Not on file  Transportation Needs: Not on file  Physical Activity: Not on file  Stress: Not on file  Social Connections: Not on file   Family History  Problem Relation Age of Onset  . Breast cancer Daughter 57       [name] BRCA negative   Allergies  Allergen Reactions  . Cefdinir Rash  . Protonix [Pantoprazole Sodium] Rash   Prior to Admission medications   Medication Sig Start Date End Date Taking? Authorizing Provider   Calcium-Magnesium-Vitamin D (CALCIUM 1200+D3 PO) Take 1,200 mg by mouth daily.     [provider]  levothyroxine (SYNTHROID) 112 MCG tablet TAKE 1 TABLET(112 MCG) BY MOUTH DAILY BEFORE AND BREAKFAST 03/07/20   Lequita Asal, MD  Multiple Vitamins-Minerals (MULTIVITAMIN WITH MINERALS) tablet Take 1 tablet by mouth daily.    [provider]  simvastatin (ZOCOR) 40 MG tablet Take 40 mg by mouth every evening.     [provider]   DG Ankle Complete Right  Result Date: 03/16/2020 CLINICAL DATA:  Patient slipped and fell.  Obvious ankle deformity. EXAM: RIGHT ANKLE - COMPLETE 3+ VIEW COMPARISON:  None. FINDINGS: There is an acute ankle trimalleolar fracture/dislocation. Relative to the tibial plafond, the talus is posterolaterally dislocated. There are laterally displaced fractures of the medial and lateral malleoli. Posterior malleolar fracture is posteriorly displaced. No definite tarsal bone fractures, although integrity of the calcaneal tuberosity is difficult to confirm on these views. Associated soft tissue swelling without evidence of foreign body or soft tissue emphysema. IMPRESSION: Acute trimalleolar fracture/dislocation as described. Electronically Signed   By: Richardean Sale M.D.   On: 03/16/2020 10:07   Positive ROS: All other systems have been reviewed and were otherwise negative with the exception of those mentioned in the HPI and as above.  Physical Exam: General:  Alert, no acute distress Psychiatric:  Patient is competent for consent with normal mood and affect   Cardiovascular:  No pedal edema Respiratory:  No wheezing, non-labored breathing GI:  Abdomen is soft and non-tender Skin:  No lesions in the area of chief complaint Neurologic:  Sensation intact distally Lymphatic:  No axillary or cervical lymphadenopathy  Orthopedic Exam:  Orthopedic examination is limited to the right lower extremity and foot.  The patient is in a posterior splint  maintaining the ankle in neutral position.  The skin appears to be intact at the proximal and distal portions of the splint.  She is able to actively dorsiflex and plantarflex her toes.  Sensation intact to light touch to all digits.  She has good capillary refill to all digits.  X-rays:  Recent AP, lateral, and mortise views of the right ankle are available for review and have been reviewed by myself.  The findings are as described above.  Assessment: Grade I open trimalleolar fracture/dislocation, right ankle.  Plan: The treatment options have been discussed with the patient, including both surgical and nonsurgical choices.  The patient would like to proceed with surgical intervention to include an open reduction and internal fixation of the trimalleolar fracture dislocation of her right ankle.  This procedure has been discussed in detail with the patient, as have the potential risks (including bleeding, infection, nerve and blood vessel injury, persistent recurrent pain, stiffness of the ankle, development of arthritis, malunion or nonunion, need for further surgery, blood clots, strokes, heart attacks and arrhythmias, etc.)  and benefits.  The patient states her understanding and wishes to proceed.  A formal written consent will be obtained by the nursing staff.  Thank you for asking me to participate in the care of this most pleasant yet unfortunate woman.  I will be happy to follow her with you.   Pascal Lux, MD  Beeper #:  [phone]  03/16/2020 12:50 PM

## 2020-03-16 NOTE — ED Notes (Signed)
Report given to OR charge nurse

## 2020-03-16 NOTE — ED Triage Notes (Signed)
Brought in vis EMS s/p fall  Slipped on ice   Injury to right ankle  Positive deformity  Good pulses

## 2020-03-16 NOTE — ED Provider Notes (Signed)
[facility] Emergency Department Provider Note ____________________________________________  Time seen: Approximately 9:37 AM  I have reviewed the triage vital signs and the nursing notes.   HISTORY  Chief Complaint Fall    HPI Tanya Frey is a 75 y.o. female with history of thyroid cancer currently undergoing radiation treatment who presents to the emergency department for evaluation and treatment of right ankle pain after slipping on the ice.EMS report deformity.  Past Medical History:  Diagnosis Date  . Anemia   . Difficult intubation   . Diffuse cystic mastopathy   . Family history of malignant neoplasm of breast   . Hyperlipidemia   . Obesity, unspecified   . Personal history of tobacco use, presenting hazards to health   . Thyroid cancer (HCC)    T4a, NX: 4.6 cm lesion with extrathyroidal extension. Received I 131 post procedure.    . Varicose veins     Patient Active Problem List   Diagnosis Date Noted  . Open ankle fracture, right, type I or II, initial encounter 03/16/2020  . Malignant neoplasm of upper-outer quadrant of right breast in female, estrogen receptor positive ([place]) 01/07/2020  . Osteopenia of spine 07/31/2018  . Hypothyroidism 06/23/2017  . Thyroid cancer ([place]) 03/07/2017  . S/P total thyroidectomy 02/21/2017  . Fibrocystic breast disease 11/27/2013  . Hyperlipidemia 06/14/2013  . Transient anemia 06/14/2013  . Family history of breast cancer 05/15/2012    Past Surgical History:  Procedure Laterality Date  . BREAST LUMPECTOMY,RADIO FREQ LOCALIZER,AXILLARY SENTINEL LYMPH NODE BIOPSY Right 01/14/2020   Procedure: BREAST LUMPECTOMY,RADIO FREQ LOCALIZER,AXILLARY SENTINEL LYMPH NODE BIOPSY;  Surgeon: Jules Husbands, MD;  Location: [facility] ORS;  Service: General;  Laterality: Right;  . BREAST SURGERY    . COLONOSCOPY  2011   Dr. [name]; colon polyps (tubular adenoma)  . COLONOSCOPY N/A   . COLONOSCOPY WITH PROPOFOL N/A  10/10/2014   Procedure: COLONOSCOPY WITH PROPOFOL;  Surgeon: [name], MD;  Location: [facility] ENDOSCOPY;  Service: Endoscopy;  Laterality: N/A;  . THYROID SURGERY Bilateral   . THYROIDECTOMY N/A 02/21/2017   Procedure: THYROIDECTOMY;  Surgeon: [name], MD;  Location: [facility] ORS;  Service: ENT;  Laterality: N/A;  . TUBAL LIGATION    . VARICOSE VEIN SURGERY    . vein closure procedure Right 2009    Prior to Admission medications   Medication Sig Start Date End Date Taking? Authorizing Provider  Calcium-Magnesium-Vitamin D (CALCIUM 1200+D3 PO) Take 1,200 mg by mouth daily.     [provider]  levothyroxine (SYNTHROID) 112 MCG tablet TAKE 1 TABLET(112 MCG) BY MOUTH DAILY BEFORE AND BREAKFAST 03/07/20   Lequita Asal, MD  Multiple Vitamins-Minerals (MULTIVITAMIN WITH MINERALS) tablet Take 1 tablet by mouth daily.    [provider]  simvastatin (ZOCOR) 40 MG tablet Take 40 mg by mouth every evening.     [provider]    Allergies Cefdinir and Protonix [pantoprazole sodium]  Family History  Problem Relation Age of Onset  . Breast cancer Daughter 41       [name] BRCA negative    Social History Social History   Tobacco Use  . Smoking status: Former Smoker    Packs/day: 1.00    Years: 10.00    Pack years: 10.00    Quit date: 02/07/1990    Years since quitting: 30.1  . Smokeless tobacco: Never Used  Vaping Use  . Vaping Use: Never used  Substance Use Topics  . Alcohol use: Yes  Alcohol/week: 1.0 - 4.0 standard drink    Types: 1 - 4 Standard drinks or equivalent per week    Comment: wine on the weekend  . Drug use: No    Review of Systems Constitutional: Negative for fever. Cardiovascular: Negative for chest pain. Respiratory: Negative for shortness of breath. Musculoskeletal: Deformity of right ankle. Demonstrates ROM of toes. Skin: Wound overlying medial malleolus.   Neurological: Negative for decrease in  sensation  ____________________________________________   PHYSICAL EXAM:  VITAL SIGNS: ED Triage Vitals [03/16/20 0916]  Enc Vitals Group     BP (!) 141/78     Pulse Rate 75     Resp 18     Temp 98 F (36.7 C)     Temp Source Oral     SpO2 99 %     Weight 158 lb 11.7 oz (72 kg)     Height 5' (1.524 m)     Head Circumference      Peak Flow      Pain Score 4     Pain Loc      Pain Edu?      Excl. in [place]?     Constitutional: Alert and oriented. Well appearing and in no acute distress. Eyes: Conjunctivae are clear without discharge or drainage Head: Atraumatic Neck: Supple. No focal midline tenderness. Respiratory: No cough. Respirations are even and unlabored. Musculoskeletal: Deformity of the right ankle. Demonstrates ROM of toes. Neurologic: Awake, alert, oriented.  Skin: 1.5  Psychiatric: Affect and behavior are appropriate.  ____________________________________________   LABS (all labs ordered are listed, but only abnormal results are displayed)  Labs Reviewed  BASIC METABOLIC PANEL - Abnormal; Notable for the following components:      Result Value   Glucose, Bld 121 (*)    All other components within normal limits  SARS CORONAVIRUS 2 BY RT PCR (HOSPITAL ORDER, [place] [facility])  CBC WITH DIFFERENTIAL/PLATELET   ____________________________________________  RADIOLOGY  Trimalleolar fracture and dislocation of right ankle.  I, Sherrie George, personally viewed and evaluated these images (plain radiographs) as part of my medical decision making, as well as reviewing the written report by the radiologist.  DG Ankle Complete Right  Result Date: 03/16/2020 CLINICAL DATA:  Patient slipped and fell.  Obvious ankle deformity. EXAM: RIGHT ANKLE - COMPLETE 3+ VIEW COMPARISON:  None. FINDINGS: There is an acute ankle trimalleolar fracture/dislocation. Relative to the tibial plafond, the talus is posterolaterally dislocated. There are laterally  displaced fractures of the medial and lateral malleoli. Posterior malleolar fracture is posteriorly displaced. No definite tarsal bone fractures, although integrity of the calcaneal tuberosity is difficult to confirm on these views. Associated soft tissue swelling without evidence of foreign body or soft tissue emphysema. IMPRESSION: Acute trimalleolar fracture/dislocation as described. Electronically Signed   By: Richardean Sale M.D.   On: 03/16/2020 10:07   ____________________________________________   PROCEDURES  .Ortho Injury Treatment  Date/Time: 03/16/2020 12:01 PM Performed by: Victorino Dike, FNP Authorized by: Victorino Dike, FNP   Consent:    Consent obtained:  Verbal   Consent given by:  Patient   Risks discussed:  Restricted joint movement and irreducible dislocationInjury location: ankle Location details: right ankle Injury type: fracture-dislocation Fracture type: trimalleolar Pre-procedure neurovascular assessment: neurovascularly intact Pre-procedure distal perfusion: normal Pre-procedure neurological function: normal Pre-procedure range of motion: reduced  Anesthesia: Local anesthesia used: no  Patient sedated: NoManipulation performed: yes Reduction successful: no Immobilization: splint Splint type: short leg Splint Applied by:  ED Nurse and ED Provider Supplies used: cotton padding,  elastic bandage and Ortho-Glass Post-procedure neurovascular assessment: post-procedure neurovascularly intact Post-procedure distal perfusion: normal Post-procedure neurological function: normal Post-procedure range of motion: unchanged     ____________________________________________   INITIAL IMPRESSION / ASSESSMENT AND PLAN / ED COURSE  Tanya Frey is a 75 y.o. who presents to the emergency department for after mechanical, nonsyncopal fall at home this morning.  See HPI for further details.  Obvious deformity of the right ankle.  Good DP and PT pulses.  She  is able to perform range of motion of the toes.   Reduction attempted without sedation. Minimal reduction. RN has already given report to the OR, and it appears that she will be going up within the next few minutes. No additional attempts at reduction.  Vancomycin ordered pre-op due to allergy to cefdinir.   Medications  vancomycin (VANCOCIN) IVPB 1000 mg/200 mL premix (has no administration in time range)  fentaNYL (SUBLIMAZE) injection 100 mcg (100 mcg Intravenous Given 03/16/20 1118)    Pertinent labs & imaging results that were available during my care of the patient were reviewed by me and considered in my medical decision making (see chart for details).   _________________________________________   FINAL CLINICAL IMPRESSION(S) / ED DIAGNOSES  Final diagnoses:  Type I or II open trimalleolar fracture of right ankle, initial encounter    ED Discharge Orders    None       If controlled substance prescribed during this visit, 12 month history viewed on the [place] prior to issuing an initial prescription for Schedule II or III opiod.   Victorino Dike, FNP 03/16/20 1256    Vladimir Crofts, MD 03/16/20 [id number]

## 2020-03-16 NOTE — Anesthesia Postprocedure Evaluation (Signed)
Anesthesia Post Note  Patient: Tanya Frey  Procedure(s) Performed: OPEN REDUCTION INTERNAL FIXATION (ORIF) ANKLE FRACTURE (Right Ankle)  Patient location during evaluation: PACU Anesthesia Type: General Level of consciousness: awake and alert Pain management: pain level controlled Vital Signs Assessment: post-procedure vital signs reviewed and stable Respiratory status: spontaneous breathing, nonlabored ventilation and respiratory function stable Cardiovascular status: blood pressure returned to baseline and stable Postop Assessment: no apparent nausea or vomiting Anesthetic complications: no Comments: Pt w mild tachy (108), O2, BP stable. EKG with essentially no sig change from 11 AM today. Pt lucid with no complaints whatsoever. Specifically, no CP, SOB, sweating, radiation to back/arms. Discussed w surgeon, will place on telemetry and pt will be reviewed by hospitalist on ward.   No complications documented.   Last Vitals:  Vitals:   03/16/20 1615 03/16/20 1630  BP: 135/75 (!) 145/75  Pulse: (!) 107 (!) 107  Resp: 19 19  Temp:  36.5 C  SpO2: 98% 99%    Last Pain:  Vitals:   03/16/20 1630  TempSrc:   PainSc: 0-No pain                 Alphonsus Sias

## 2020-03-16 NOTE — Anesthesia Preprocedure Evaluation (Signed)
Anesthesia Evaluation  Patient identified by MRN, date of birth, ID band Patient awake  General Assessment Comment:Difficult airway hx listed in patient chart; patient denies ever being told she was a difficult intubation. Last surgery (thyroidectomy) with Grade II view with [place] 3.  Reviewed: Allergy & Precautions, H&P , NPO status , Patient's Chart, lab work & pertinent test results  History of Anesthesia Complications (+) DIFFICULT AIRWAY and history of anesthetic complications  Airway Mallampati: III  TM Distance: <3 FB Neck ROM: Full    Dental  (+) Chipped, Poor Dentition   Pulmonary neg shortness of breath, Patient abstained from smoking.Not current smoker, former smoker,    Pulmonary exam normal breath sounds clear to auscultation       Cardiovascular Exercise Tolerance: Good (-) angina(-) Past MI and (-) DOE negative cardio ROS   Rhythm:Regular Rate:Normal - Systolic murmurs    Neuro/Psych negative neurological ROS  negative psych ROS   GI/Hepatic negative GI ROS, Neg liver ROS, neg GERD  ,  Endo/Other  Hypothyroidism   Renal/GU      Musculoskeletal   Abdominal   Peds  Hematology negative hematology ROS (+)   Anesthesia Other Findings Past Medical History: No date: Anemia No date: Diffuse cystic mastopathy No date: Family history of malignant neoplasm of breast No date: Hyperlipidemia No date: Obesity, unspecified No date: Personal history of tobacco use, presenting hazards to health No date: Varicose veins  Past Surgical History: 1989: BREAST EXCISIONAL BIOPSY; Bilateral     Comment:  neg 2011: COLONOSCOPY     Comment:  Dr. [name]; colon polyps (tubular adenoma) 10/10/2014: COLONOSCOPY WITH PROPOFOL; N/A     Comment:  Procedure: COLONOSCOPY WITH PROPOFOL;  Surgeon: [name], MD;  Location: [facility] ENDOSCOPY;  Service:               Endoscopy;  Laterality: N/A; No date: TUBAL  LIGATION No date: VARICOSE VEIN SURGERY 2009: vein closure procedure; Right     Reproductive/Obstetrics negative OB ROS                           Anesthesia Physical  Anesthesia Plan  ASA: II  Anesthesia Plan: General   Post-op Pain Management:    Induction: Intravenous  PONV Risk Score and Plan: 3 and Ondansetron and Dexamethasone  Airway Management Planned: LMA  Additional Equipment: None  Intra-op Plan:   Post-operative Plan: Extubation in OR  Informed Consent: I have reviewed the patients History and Physical, chart, labs and discussed the procedure including the risks, benefits and alternatives for the proposed anesthesia with the patient or authorized representative who has indicated his/her understanding and acceptance.     Dental Advisory Given  Plan Discussed with: Anesthesiologist, CRNA and Surgeon  Anesthesia Plan Comments: (Discussed risks of anesthesia with patient, including PONV, sore throat, lip/dental damage. Rare risks discussed as well, such as cardiorespiratory and neurological sequelae. Patient understands. Patient has listed allergy to third generation cephalosporin Cefdinir (rash) Severe blistering skin reaction (SJS/TEN)? no Liver or kidney injury caused by PCN? no Hemolytic anemia from PCN? no Drug fever? no Painful swollen joints? no Severe reaction involving inside of mouth, eye, or genital ulcers? no Based on current evidence Alfonse Alpers et al, J Allergy Clin Immunol Pract, 2019), will proceed with cefazolin use: Yes  . No shared side chains. Discussed with Dr Roland Rack )  Anesthesia Quick Evaluation  

## 2020-03-16 NOTE — Transfer of Care (Signed)
Immediate Anesthesia Transfer of Care Note  Patient: Tanya Frey  Procedure(s) Performed: OPEN REDUCTION INTERNAL FIXATION (ORIF) ANKLE FRACTURE (Right Ankle)  Patient Location: PACU  Anesthesia Type:General  Level of Consciousness: drowsy  Airway & Oxygen Therapy: Patient Spontanous Breathing and Patient connected to face mask oxygen  Post-op Assessment: Report given to RN and Post -op Vital signs reviewed and stable  Post vital signs: Reviewed and stable  Last Vitals:  Vitals Value Taken Time  BP 120/63 03/16/20 1515  Temp    Pulse 100 03/16/20 1521  Resp 12 03/16/20 1521  SpO2 100 % 03/16/20 1521  Vitals shown include unvalidated device data.  Last Pain:  Vitals:   03/16/20 1250  TempSrc: Oral  PainSc: 0-No pain         Complications: No complications documented.

## 2020-03-16 NOTE — H&P (Signed)
History and Physical    Tanya Frey DJS:[id number] DOB: 07/08/1945 DOA: 03/16/2020  PCP: [name], MD   Patient coming from: Home  I have personally briefly reviewed patient's old medical records in [place]  Chief Complaint: Right ankle pain  HPI: Tanya Frey is a 75 y.o. female with medical history significant for thyroid cancer status post surgical resection, history of breast cancer getting radiotherapy, anemia of chronic disease who presents to the emergency room for evaluation following a fall morning of her admission.  Patient slipped on ice while trying to get the paper on the morning of her admission.  She was unable to get up or bear weight on her right lower extremity due to severe pain and rolled back into the house.  Patient is a 7 injury to the right ankle with positive deformity EMS was called. She denied feeling dizzy or lightheaded prior to the fall, she denied having any chest pain, no shortness of breath, no nausea, no vomiting, no fever, no chills, no urinary frequency, no nocturia, no dysuria no palpitations, no diaphoresis, no abdominal pain Labs show sodium 138, potassium 4.2, chloride 103, bicarb 26, glucose 121, BUN 12, creatinine 0.61, calcium 9.4, white count 6.9, hemoglobin 13.7, hematocrit 42.2, MCV 87.6, RDW 14.5, platelet count 198 Her SARS coronavirus 2 PCR test is negative Right ankle x-ray shows acute trimalleolar fracture/dislocation Twelve-lead EKG shows sinus rhythm with nonspecific ST abnormality   ED Course: Patient is a 75 year old Caucasian female with a history of breast cancer currently undergoing radiation therapy who was brought into the ER for evaluation following a fall at home.  She has an acute trimalleolar fracture of the right ankle.  Orthopedic surgery has been consulted in the ER.  She will be admitted to the hospital for further evaluation.  Review of Systems: As per HPI otherwise all systems reviewed and  negative.    Past Medical History:  Diagnosis Date  . Anemia   . Difficult intubation   . Diffuse cystic mastopathy   . Family history of malignant neoplasm of breast   . Hyperlipidemia   . Obesity, unspecified   . Personal history of tobacco use, presenting hazards to health   . Thyroid cancer (HCC)    T4a, NX: 4.6 cm lesion with extrathyroidal extension. Received I 131 post procedure.    . Varicose veins     Past Surgical History:  Procedure Laterality Date  . BREAST LUMPECTOMY,RADIO FREQ LOCALIZER,AXILLARY SENTINEL LYMPH NODE BIOPSY Right 01/14/2020   Procedure: BREAST LUMPECTOMY,RADIO FREQ LOCALIZER,AXILLARY SENTINEL LYMPH NODE BIOPSY;  Surgeon: Jules Husbands, MD;  Location: [facility] ORS;  Service: General;  Laterality: Right;  . BREAST SURGERY    . COLONOSCOPY  2011   Dr. [name]; colon polyps (tubular adenoma)  . COLONOSCOPY N/A   . COLONOSCOPY WITH PROPOFOL N/A 10/10/2014   Procedure: COLONOSCOPY WITH PROPOFOL;  Surgeon: [name], MD;  Location[facility] ENDOSCOPY;  Service: Endoscopy;  Laterality: N/A;  . THYROID SURGERY Bilateral   . THYROIDECTOMY N/A 02/21/2017   Procedure: THYROIDECTOMY;  Surgeon: [name], MD;  Location: [facility] ORS;  Service: ENT;  Laterality: N/A;  . TUBAL LIGATION    . VARICOSE VEIN SURGERY    . vein closure procedure Right 2009     reports that she quit smoking about 30 years ago. She has a 10.00 pack-year smoking history. She has never used smokeless tobacco. She reports current alcohol use of about 1.0 - 4.0 standard drink of  alcohol per week. She reports that she does not use drugs.  Allergies  Allergen Reactions  . Cefdinir Rash  . Protonix [Pantoprazole Sodium] Rash    Family History  Problem Relation Age of Onset  . Breast cancer Daughter 19       [name] BRCA negative     Prior to Admission medications   Medication Sig Start Date End Date Taking? Authorizing Provider  Calcium-Magnesium-Vitamin D (CALCIUM 1200+D3 PO)  Take 1,200 mg by mouth daily.     [provider]  levothyroxine (SYNTHROID) 112 MCG tablet TAKE 1 TABLET(112 MCG) BY MOUTH DAILY BEFORE AND BREAKFAST 03/07/20   Lequita Asal, MD  Multiple Vitamins-Minerals (MULTIVITAMIN WITH MINERALS) tablet Take 1 tablet by mouth daily.    [provider]  simvastatin (ZOCOR) 40 MG tablet Take 40 mg by mouth every evening.     [provider]    Physical Exam: Vitals:   03/16/20 0916  BP: (!) 141/78  Pulse: 75  Resp: 18  Temp: 98 F (36.7 C)  TempSrc: Oral  SpO2: 99%  Weight: 72 kg  Height: 5' (1.524 m)     Vitals:   03/16/20 0916  BP: (!) 141/78  Pulse: 75  Resp: 18  Temp: 98 F (36.7 C)  TempSrc: Oral  SpO2: 99%  Weight: 72 kg  Height: 5' (1.524 m)    Constitutional: NAD, alert and oriented x 3.  Appears uncomfortable Eyes: PERRL, lids and conjunctivae normal ENMT: Mucous membranes are moist.  Neck: normal, supple, no masses, no thyromegaly Respiratory: clear to auscultation bilaterally, no wheezing, no crackles. Normal respiratory effort. No accessory muscle use.  Cardiovascular: Regular rate and rhythm, no murmurs / rubs / gallops. No extremity edema. 2+ pedal pulses. No carotid bruits.  Abdomen: no tenderness, no masses palpated. No hepatosplenomegaly. Bowel sounds positive.  Musculoskeletal: no clubbing / cyanosis.  Decreased range of motion right ankle Skin: no rashes, lesions, ulcers.  Neurologic: No gross focal neurologic deficit. Psychiatric: Normal mood and affect.   Labs on Admission: I have personally reviewed following labs and imaging studies  CBC: Recent Labs  Lab 03/16/20 1057  WBC 6.9  NEUTROABS 5.3  HGB 13.7  HCT 42.2  MCV 87.6  PLT 269   Basic Metabolic Panel: Recent Labs  Lab 03/16/20 1057  NA 138  K 4.2  CL 103  CO2 26  GLUCOSE 121*  BUN 12  CREATININE 0.61  CALCIUM 9.4   GFR: Estimated Creatinine Clearance: 56.3 mL/min (by C-G formula based on SCr of  0.61 mg/dL). Liver Function Tests: No results for input(s): AST, ALT, ALKPHOS, BILITOT, PROT, ALBUMIN in the last 168 hours. No results for input(s): LIPASE, AMYLASE in the last 168 hours. No results for input(s): AMMONIA in the last 168 hours. Coagulation Profile: No results for input(s): INR, PROTIME in the last 168 hours. Cardiac Enzymes: No results for input(s): CKTOTAL, CKMB, CKMBINDEX, TROPONINI in the last 168 hours. BNP (last 3 results) No results for input(s): PROBNP in the last 8760 hours. HbA1C: No results for input(s): HGBA1C in the last 72 hours. CBG: No results for input(s): GLUCAP in the last 168 hours. Lipid Profile: No results for input(s): CHOL, HDL, LDLCALC, TRIG, CHOLHDL, LDLDIRECT in the last 72 hours. Thyroid Function Tests: No results for input(s): TSH, T4TOTAL, FREET4, T3FREE, THYROIDAB in the last 72 hours. Anemia Panel: No results for input(s): VITAMINB12, FOLATE, FERRITIN, TIBC, IRON, RETICCTPCT in the last 72 hours. Urine analysis: No results found for: COLORURINE,  APPEARANCEUR, LABSPEC, PHURINE, GLUCOSEU, HGBUR, BILIRUBINUR, KETONESUR, PROTEINUR, UROBILINOGEN, NITRITE, LEUKOCYTESUR  Radiological Exams on Admission: DG Ankle Complete Right  Result Date: 03/16/2020 CLINICAL DATA:  Patient slipped and fell.  Obvious ankle deformity. EXAM: RIGHT ANKLE - COMPLETE 3+ VIEW COMPARISON:  None. FINDINGS: There is an acute ankle trimalleolar fracture/dislocation. Relative to the tibial plafond, the talus is posterolaterally dislocated. There are laterally displaced fractures of the medial and lateral malleoli. Posterior malleolar fracture is posteriorly displaced. No definite tarsal bone fractures, although integrity of the calcaneal tuberosity is difficult to confirm on these views. Associated soft tissue swelling without evidence of foreign body or soft tissue emphysema. IMPRESSION: Acute trimalleolar fracture/dislocation as described. Electronically Signed   By: Richardean Sale M.D.   On: 03/16/2020 10:07    EKG: Independently reviewed.  Sinus rhythm Nonspecific ST changes  Assessment/Plan Active Problems:   Open ankle fracture, right, type I or II, initial encounter      Open ankle fracture, right ankle Following a fall Orthopedic consult has been placed for surgical repair Immobilize right lower extremity Pain control    Hypothyroidism Continue Synthroid   Dyslipidemia Continue statins   History of right breast cancer Follow-up with radiation oncology as an outpatient for continued radiation therapy   DVT prophylaxis: SCD Code Status: Full code Family Communication: Greater than 50% of time was spent discussing patient's condition and plan of care with her and her daughter at the bedside.  All questions and concerns have been addressed.  They verbalized understanding and agree with the plan. Disposition Plan: Back to previous home environment Consults called: Orthopedic surgery    Jalynn Betzold MD [facility]     03/16/2020, 11:56 AM

## 2020-03-16 NOTE — Consult Note (Signed)
PHARMACY -  BRIEF ANTIBIOTIC NOTE   Pharmacy has received consult(s) for Vancomycin for surgical prophylaxis from an ED provider.  The patient's profile has been reviewed for ht/wt/allergies/indication/available labs.    Order(s) placed for Vancomycin 1000 mg IV Q12H x 2 doses   Further antibiotics/pharmacy consults should be ordered by admitting physician if indicated.                       Thank you, Dorothe Pea, PharmD, BCPS Clinical Pharmacist  03/16/2020  10:55 AM

## 2020-03-16 NOTE — Anesthesia Procedure Notes (Signed)
Procedure Name: LMA Insertion Date/Time: 03/16/2020 1:15 PM Performed by: Demetrius Charity, CRNA Pre-anesthesia Checklist: Patient identified, Patient being monitored, Timeout performed, Emergency Drugs available and Suction available Patient Re-evaluated:Patient Re-evaluated prior to induction Oxygen Delivery Method: Circle system utilized Preoxygenation: Pre-oxygenation with 100% oxygen Induction Type: IV induction Ventilation: Mask ventilation without difficulty LMA: LMA inserted LMA Size: 3.0 Tube type: Oral Number of attempts: 1 Placement Confirmation: positive ETCO2 and breath sounds checked- equal and bilateral Tube secured with: Tape Dental Injury: Teeth and Oropharynx as per pre-operative assessment  Comments: Performed by Heide Scales, SRNA under direct supervision by Dr. Bertell Maria and Catie Jaeda Bruso, CRNA

## 2020-03-16 NOTE — Op Note (Signed)
03/16/2020  3:16 PM  Patient:   Tanya Frey  Pre-Op Diagnosis:   Grade 1 open comminuted trimalleolar fracture dislocation, right ankle.  Post-Op Diagnosis:   Same.  Procedure:   Open reduction and internal fixation of grade 1 open trimalleolar fracture dislocation with fixation of the medial and lateral malleolar fragments, right ankle.  Surgeon:   Pascal Lux, MD  Assistant:   None  Anesthesia:   General LMA  Findings:   As above.  Complications:   None  EBL:   25 cc  Fluids:   700 cc crystalloid  UOP:   None  TT:   73 min at 250 mmHg  Drains:   None  Closure:   Staples  Implants:   Zimmer-Biomet short anatomic distal fibular locking plate and screws  Brief Clinical Note:   The patient is a 75 year old female who sustained the above-noted injury this morning when she slipped and fell on her steps while leaving her home. X-rays in the emergency room demonstrated the above-noted injury. The fracture was reduced in the emergency room and splinted. The patient presents at this time for definitive management of his/her injury.  Procedure:   The patient was brought into the operating room and lain in the supine position. After adequate general laryngeal mask anesthesia was obtained, the patient's right foot and lower leg were prepped with ChloraPrep solution, then draped sterilely. Preoperative antibiotics were administered. A timeout was performed to verify the appropriate surgical site before the limb was exsanguinated with an Esmarch and the calf tourniquet inflated to 250 mmHg.   Laterally, an 8-10 cm incision was made over the lateral aspect of the distal fibula. The incision was carried down through the subcutaneous tissues to expose the fracture site. The fracture hematoma was debrided before the fracture was reduced.  The distal fibular fracture fragment was noted to have comminution of the lateral cortex at the shaft/malleolar junction with a longitudinal  fracture line extending distally through the anterior third of the distal fragment. A short precontoured anatomic Zimmer-Biomet distal fibular locking plate was applied over the lateral aspect of the distal fibula. After verifying its position fluoroscopically, it was secured using a 3.5 mm nonlocking cortical screw proximal to the fracture. Again the plate's position was adjusted slightly based on AP and lateral projections before it was secured using three additional bicortical screws proximally and multiple locking screws distally. The adequacy of fracture reduction and hardware position was verified fluoroscopically in AP and lateral projections and found to be excellent.   Attention was directed to the medial side. An approximately 3-4 cm longitudinal incision was made over the anterior and distal portions of the medial malleolus. This incision also was carried down through the subcutaneous tissues to expose the fracture site. Care was taken to identify and protect the saphenous nerve and vein. The fracture hematoma again was debrided using a curette and irrigation before the small medial malleolar fracture fragment was reduced. A single guidewire was placed obliquely across the fracture from distal to proximal into the distal tibial metaphysis. After verifying its position fluoroscopically, the guidewire was over-reamed and replaced with a 40 mm partially threaded 4.0 cancellous screw in lag fashion. Again the adequacy of fracture reduction, hardware position, and mortise restoration was verified in AP, lateral, and oblique projections and found to be excellent.  Each wound was copiously irrigated with sterile saline solution. Laterally, the subcutaneous tissues were closed in two layers using 2-0 and 3-0 Vicryl interrupted sutures before  the skin was closed using staples. Medially, the subcutaneous tissues were closed using 3-0 Vicryl interrupted sutures before the skin was closed using staples. A total  of 20 cc of 0.5% plain Sensorcaine was injected in and around the incision sites to help with postoperative analgesia. Sterile bulky dressings were applied to the wounds before the patient was placed into a posterior splint with a sugar tong supplement, maintaining the ankle in neutral dorsiflexion. The patient was then awakened, extubated, and returned to the recovery room in satisfactory condition after tolerating the procedure well.

## 2020-03-16 NOTE — Plan of Care (Signed)

## 2020-03-16 NOTE — ED Notes (Signed)
Or tech here to take pt  Vanco sent with pt    Clothing and glasses given to daughter

## 2020-03-17 ENCOUNTER — Ambulatory Visit: Payer: Medicare HMO

## 2020-03-17 ENCOUNTER — Encounter: Payer: Self-pay | Admitting: Surgery

## 2020-03-17 DIAGNOSIS — S82891B Other fracture of right lower leg, initial encounter for open fracture type I or II: Secondary | ICD-10-CM | POA: Diagnosis not present

## 2020-03-17 LAB — BASIC METABOLIC PANEL
Anion gap: 7 (ref 5–15)
BUN: 10 mg/dL (ref 8–23)
CO2: 22 mmol/L (ref 22–32)
Calcium: 8.1 mg/dL — ABNORMAL LOW (ref 8.9–10.3)
Chloride: 104 mmol/L (ref 98–111)
Creatinine, Ser: 0.49 mg/dL (ref 0.44–1.00)
GFR, Estimated: >60 mL/min (ref >60)
Glucose, Bld: 114 mg/dL — ABNORMAL HIGH (ref 70–99)
Potassium: 3.9 mmol/L (ref 3.5–5.1)
Sodium: 133 mmol/L — ABNORMAL LOW (ref 135–145)

## 2020-03-17 LAB — CBC
HCT: 33.1 % — ABNORMAL LOW (ref 36.0–46.0)
Hemoglobin: 11 g/dL — ABNORMAL LOW (ref 12.0–15.0)
MCH: 28.8 pg (ref 26.0–34.0)
MCHC: 33.2 g/dL (ref 30.0–36.0)
MCV: 86.6 fL (ref 80.0–100.0)
Platelets: 166 10*3/uL (ref 150–400)
RBC: 3.82 MIL/uL — ABNORMAL LOW (ref 3.87–5.11)
RDW: 14.6 % (ref 11.5–15.5)
WBC: 7.8 10*3/uL (ref 4.0–10.5)
nRBC: 0 % (ref 0.0–0.2)

## 2020-03-17 MED ORDER — METHOCARBAMOL 500 MG PO TABS
500.0000 mg | ORAL_TABLET | Freq: Four times a day (QID) | ORAL | Status: DC | PRN
  Administered 2020-03-17: 500 mg via ORAL
  Filled 2020-03-17: qty 1

## 2020-03-17 MED ORDER — HYDROCODONE-ACETAMINOPHEN 5-325 MG PO TABS: 1.0000 | ORAL_TABLET | ORAL | 0 refills | Status: DC | PRN

## 2020-03-17 MED ORDER — METHOCARBAMOL 500 MG PO TABS: 500.0000 mg | ORAL_TABLET | Freq: Three times a day (TID) | ORAL | 0 refills | Status: DC | PRN

## 2020-03-17 MED ORDER — ASPIRIN EC 325 MG PO TBEC: 325.0000 mg | DELAYED_RELEASE_TABLET | Freq: Two times a day (BID) | ORAL | 0 refills | Status: DC

## 2020-03-17 NOTE — Evaluation (Signed)
Physical Therapy Evaluation Patient Details Name: Tanya Frey MRN: [id number] DOB: 22-Mar-1945 Today's Date: 03/17/2020   History of Present Illness  Per MD notes: Pt is a 75 y.o. female with medical history significant for thyroid cancer status post surgical resection, history of breast cancer getting radiotherapy, and anemia of chronic disease who presents to the emergency room for evaluation following a fall.  Pt diagnosed with open comminuted trimalleolar fracture dislocation of the right ankle and is s/p ORIF.    Clinical Impression   Patient suffers from open comminuted trimalleolar fracture s/p ORIF which impairs her ability to perform daily activities like toileting, feeding, dressing, grooming, bathing in the home. A cane, walker, crutch will not resolve the patient's issue with performing activities of daily living. A lightweight wheelchair and cushion is required/recommended and will allow patient to safely perform daily activities. Patient can safely propel the wheelchair in the home or has a caregiver who can provide assistance.  Pt was pleasant and motivated to participate during the session.  Pt required min A with transfers and gait along with multi-modal cuing for proper sequencing for WB compliance.  Pt was able to amb with hop-to gait with a RW 2 x 4 feet but hops were effortful with min L foot clearance.  Pt will benefit from PT services in a SNF setting upon discharge to safely address deficits listed in patient problem list for decreased caregiver assistance and eventual return to PLOF.       Follow Up Recommendations SNF;Supervision for mobility/OOB    Equipment Recommendations  Wheelchair (measurements PT);3in1 (PT);Rolling walker with 5" wheels    Recommendations for Other Services       Precautions / Restrictions Precautions Precautions: Fall Restrictions Weight Bearing Restrictions: Yes RLE Weight Bearing: Non weight bearing      Mobility  Bed  Mobility Overal bed mobility: Modified Independent             General bed mobility comments: Extra time and effort but no physical assistance needed    Transfers Overall transfer level: Needs assistance Equipment used: Rolling walker (2 wheeled) Transfers: Sit to/from Stand Sit to Stand: From elevated surface;Min assist         General transfer comment: Mod verbal and visual cues for proper sequencing for WB compliance  Ambulation/Gait Ambulation/Gait assistance: Min assist Gait Distance (Feet): 4 Feet x 2 Assistive device: Rolling walker (2 wheeled)   Gait velocity: decreased   General Gait Details: Hop-to gait with max verbal and visual cues/training for proper sequencing with pt and daughter; min A for stability and RW management with good WB compliance throughout  Stairs            Wheelchair Mobility    Modified Rankin (Stroke Patients Only)       Balance Overall balance assessment: Needs assistance Sitting-balance support: Single extremity supported;Feet unsupported Sitting balance-Leahy Scale: Good     Standing balance support: Bilateral upper extremity supported;During functional activity Standing balance-Leahy Scale: Poor Standing balance comment: Min A for stability in standing                             Pertinent Vitals/Pain Pain Assessment: No/denies pain    Home Living Family/patient expects to be discharged to:: Private residence Living Arrangements: Spouse/significant other Available Help at Discharge: Family;Available 24 hours/day Type of Home: Mobile home Home Access: Stairs to enter Entrance Stairs-Rails: None Entrance Stairs-Number of Steps: 2 Home Layout: One  level Home Equipment: Walker - 4 wheels;Cane - single point;Other (comment) Additional Comments: Owns knee scooter; pt's daughter(s) can assist PRN upon discharge    Prior Function Level of Independence: Independent         Comments: Ind amb community  distances without an AD, active with gardening, walking the dog, 2 falls in the last 6 months both secondary to slipping on ice, Ind with ADLs     Hand Dominance        Extremity/Trunk Assessment   Upper Extremity Assessment Upper Extremity Assessment: Generalized weakness    Lower Extremity Assessment Lower Extremity Assessment: Generalized weakness;RLE deficits/detail RLE: Unable to fully assess due to immobilization       Communication   Communication: No difficulties  Cognition Arousal/Alertness: Awake/alert Behavior During Therapy: WFL for tasks assessed/performed Overall Cognitive Status: Within Functional Limits for tasks assessed                                        General Comments      Exercises Total Joint Exercises Ankle Circles/Pumps: AROM;Left;10 reps;5 reps Quad Sets: Strengthening;Both;5 reps;10 reps Gluteal Sets: Strengthening;Both;5 reps;10 reps Hip ABduction/ADduction: Strengthening;Both;10 reps Straight Leg Raises: Strengthening;Both;10 reps Long Arc Quad: AROM;Strengthening;Both;5 reps;10 reps Knee Flexion: AROM;Strengthening;Both;5 reps;10 reps Other Exercises Other Exercises: HEP education/review with pt and daughter for BLE APs, QS, GS, and LAQs x 10 each every 1-2 hours daily Other Exercises: Extensive gait and transfer sequencing education with pt and daughter   Assessment/Plan    PT Assessment Patient needs continued PT services  PT Problem List Decreased strength;Decreased range of motion;Decreased activity tolerance;Decreased balance;Decreased mobility;Decreased knowledge of use of DME;Decreased knowledge of precautions       PT Treatment Interventions DME instruction;Gait training;Stair training;Functional mobility training;Therapeutic activities;Therapeutic exercise;Balance training;Patient/family education    PT Goals (Current goals can be found in the Care Plan section)  Acute Rehab PT Goals Patient Stated Goal:  To be able to walk my dog PT Goal Formulation: With patient Time For Goal Achievement: 03/30/20 Potential to Achieve Goals: Good    Frequency BID   Barriers to discharge Inaccessible home environment      Co-evaluation               AM-PAC PT "6 Clicks" Mobility  Outcome Measure Help needed turning from your back to your side while in a flat bed without using bedrails?: A Little Help needed moving from lying on your back to sitting on the side of a flat bed without using bedrails?: A Little Help needed moving to and from a bed to a chair (including a wheelchair)?: A Little Help needed standing up from a chair using your arms (e.g., wheelchair or bedside chair)?: A Little Help needed to walk in hospital room?: A Lot Help needed climbing 3-5 steps with a railing? : Total 6 Click Score: 15    End of Session Equipment Utilized During Treatment: Gait belt Activity Tolerance: Patient tolerated treatment well Patient left: in chair;with call bell/phone within reach;with chair alarm set;with family/visitor present;with SCD's reapplied Nurse Communication: Mobility status;Weight bearing status;Other (comment) (chair alarm in need of new batteries) PT Visit Diagnosis: Unsteadiness on feet (R26.81);History of falling (Z91.81);Other abnormalities of gait and mobility (R26.89);Muscle weakness (generalized) (M62.81)    Time: 5809-9833 PT Time Calculation (min) (ACUTE ONLY): 51 min   Charges:   PT Evaluation $PT Eval Moderate Complexity: 1 Mod PT Treatments $  Gait Training: 8-22 mins $Therapeutic Exercise: 8-22 mins        D. Scott Takera Rayl PT, DPT 03/17/20, 11:54 AM

## 2020-03-17 NOTE — TOC Initial Note (Signed)
Transition of Care [facility]) - Initial/Assessment Note    Patient Details  Name: Tanya Frey MRN: [id number] Date of Birth: [birth year]  [facility]) CM/SW Contact:    Shelbie Ammons, RN Phone Number: 03/17/2020, 2:22 PM  Clinical Narrative:    RNCM met with patient in room. Patient is alert and verbally responsive up sitting in recliner with daughter present in room. Patient reports to feeling pretty well today. She verbalizes that she wants to go home and would like to have home health to assist her when she gets there. She is agreeable to getting a wheelchair and 3N1 through the insurance company. She would like to have home health to assist her at home and does not have a preference as to which agency.  RNCM reached out to [place] with [facility] and she will accept referral.  RNCM reached out to [place] with [employer] for equipment.                Expected Discharge Plan: [place] Barriers to Discharge: No Barriers Identified   Patient Goals and CMS Choice        Expected Discharge Plan and Services Expected Discharge Plan: [place] Choice: [place] arrangements for the past 2 months: Single Family Home                 DME Arranged: 3-N-1,Wheelchair manual DME Agency: [employer] Date DME Agency Contacted: 03/17/20 Time DME Agency Contacted: 1418 Representative spoke with at DME Agency: Dennard Schaumann Arranged: PT,OT,Nurse's [place] Agency: [facility] Date [place]: 03/17/20 Time [place]: 1419 Representative spoke with at [place]: [place] Arrangements/Services Living arrangements for the past 2 months: [place] with:: Spouse Patient language and need for interpreter reviewed:: Yes Do you feel safe going back to the place where you live?: Yes      Need for Family Participation in Patient Care: Yes (Comment) Care giver support system  in place?: Yes (comment)   Criminal Activity/Legal Involvement Pertinent to Current Situation/Hospitalization: No - Comment as needed  Activities of Daily Living Home Assistive Devices/Equipment: None ADL Screening (condition at time of admission) Patient's cognitive ability adequate to safely complete daily activities?: Yes Is the patient deaf or have difficulty hearing?: No Does the patient have difficulty seeing, even when wearing glasses/contacts?: No Does the patient have difficulty concentrating, remembering, or making decisions?: No Patient able to express need for assistance with ADLs?: Yes Does the patient have difficulty dressing or bathing?: No Independently performs ADLs?: Yes (appropriate for developmental age) Does the patient have difficulty walking or climbing stairs?: No Weakness of Legs: None Weakness of Arms/Hands: None  Permission Sought/Granted                  Emotional Assessment Appearance:: Appears stated age Attitude/Demeanor/Rapport: Engaged Affect (typically observed): Appropriate,Calm Orientation: : Oriented to Self,Oriented to Place,Oriented to  Time,Oriented to Situation Alcohol / Substance Use: Not Applicable Psych Involvement: No (comment)  Admission diagnosis:  Surgery, elective [Z41.9] Open ankle fracture, right, type I or II, initial encounter [S82.891B] Type I or II open trimalleolar fracture of right ankle, initial encounter [S82.851B] Patient Active Problem List   Diagnosis Date Noted  . Open ankle fracture, right, type I or II, initial encounter 03/16/2020  . Malignant neoplasm of upper-outer quadrant of right breast in female, estrogen receptor positive ([place]) 01/07/2020  .  Osteopenia of spine 07/31/2018  . Hypothyroidism 06/23/2017  . Thyroid cancer ([place]) 03/07/2017  . S/P total thyroidectomy 02/21/2017  . Fibrocystic breast disease 11/27/2013  . Hyperlipidemia 06/14/2013  . Transient anemia 06/14/2013  . Family history of  breast cancer 05/15/2012   PCP:  [name], MD Pharmacy:   [place], [place], [address] [place] [address] Phone: [phone] Fax: [place], [place] - [address] [place] [address] Phone: [phone] Fax: [place], [place] - [address] [facility] [place] [address] Phone: [phone] Fax: [phone]     Social Determinants of Health (SDOH) Interventions    Readmission Risk Interventions No flowsheet data found.

## 2020-03-17 NOTE — Plan of Care (Signed)
  Problem: Pain Managment: Goal: General experience of comfort will improve Outcome: Progressing   Problem: Safety: Goal: Ability to remain free from injury will improve Outcome: Progressing   Problem: Skin Integrity: Goal: Risk for impaired skin integrity will decrease Outcome: Progressing   

## 2020-03-17 NOTE — Plan of Care (Signed)

## 2020-03-17 NOTE — Progress Notes (Signed)
Subjective: 1 Day Post-Op Procedure(s) (LRB): OPEN REDUCTION INTERNAL FIXATION (ORIF) ANKLE FRACTURE (Right) Patient reports pain as mild.   Patient is well, and has had no acute complaints or problems Plan is to go Home after hospital stay. Negative for chest pain and shortness of breath Fever: no Gastrointestinal:Negative for nausea and vomiting Patient has had a bowel movement following surgery.  Objective: Vital signs in last 24 hours: Temp:  [97.5 F (36.4 C)-98.6 F (37 C)] 98.5 F (36.9 C) (02/08 1157) Pulse Rate:  [71-122] 71 (02/08 1157) Resp:  [15-19] 16 (02/08 1157) BP: (117-151)/(61-84) 118/69 (02/08 1157) SpO2:  [96 %-100 %] 99 % (02/08 1157)  Intake/Output from previous day:  Intake/Output Summary (Last 24 hours) at 03/17/2020 1220 Last data filed at 03/17/2020 0957 Gross per 24 hour  Intake 1780 ml  Output 100 ml  Net 1680 ml    Intake/Output this shift: Total I/O In: 780 [P.O.:780] Out: -   Labs: Recent Labs    03/16/20 1057 03/17/20 0525  HGB 13.7 11.0*   Recent Labs    03/16/20 1057 03/17/20 0525  WBC 6.9 7.8  RBC 4.82 3.82*  HCT 42.2 33.1*  PLT 198 166   Recent Labs    03/16/20 1057 03/17/20 0525  NA 138 133*  K 4.2 3.9  CL 103 104  CO2 26 22  BUN 12 10  CREATININE 0.61 0.49  GLUCOSE 121* 114*  CALCIUM 9.4 8.1*   No results for input(s): LABPT, INR in the last 72 hours.   EXAM General - Patient is Alert, Appropriate and Oriented Extremity - ABD soft Incision: dressing C/D/I  Short leg splint intact to the right leg. Intact to light touch to the toes and proximal splint. Cap refill intact to each toe. Calf is soft to palpation. Motor Function - intact, moving toes on exam.  Past Medical History:  Diagnosis Date  . Anemia   . Difficult intubation   . Diffuse cystic mastopathy   . Family history of malignant neoplasm of breast   . Hyperlipidemia   . Obesity, unspecified   . Personal history of tobacco use, presenting  hazards to health   . Thyroid cancer (HCC)    T4a, NX: 4.6 cm lesion with extrathyroidal extension. Received I 131 post procedure.    . Varicose veins     Assessment/Plan: 1 Day Post-Op Procedure(s) (LRB): OPEN REDUCTION INTERNAL FIXATION (ORIF) ANKLE FRACTURE (Right) Active Problems:   Open ankle fracture, right, type I or II, initial encounter  Estimated body mass index is 31 kg/m as calculated from the following:   Height as of this encounter: 5' (1.524 m).   Weight as of this encounter: 72 kg. Up with therapy D/C IV fluids when tolerating po intake.  Labs reviewed this AM. Patient has had a BM. Current recommendation from PT is for SNF, family would like to bring patient home.  State that she will have people that can take care of her.  Will need additional home equipment. Continue with PT today, plan for possible d/c home tomorrow pending progress. Will follow-up with [facility] orthopaedics in 10-14 days for staple removal and repeat x-rays. Aspirin for DVT prophylaxis upon discharge.  DVT Prophylaxis - Lovenox and SCDs Non-weightbearing to the right leg.  Raquel James, PA-C [facility] 03/17/2020, 12:20 PM

## 2020-03-17 NOTE — Progress Notes (Signed)
PROGRESS NOTE    TAYONA SARNOWSKI  JQB:[id number] DOB: [birth year] DOA: 03/16/2020 PCP: [name], MD    Assessment & Plan:   Active Problems:   Open ankle fracture, right, type I or II, initial encounter   Tanya Frey is a 75 y.o. female with medical history significant for thyroid cancer status post surgical resection, history of breast cancer getting radiotherapy, anemia of chronic disease who presents to the emergency room for evaluation following a fall morning of her admission.  Patient slipped on ice while trying to get the paper on the morning of her admission.    Open ankle fracture, right ankle S/p OPEN REDUCTION INTERNAL FIXATION on 2/7 --Will follow-up with [facility] orthopaedics in 10-14 days for staple removal and repeat x-rays. --Aspirin for DVT prophylaxis upon discharge. --Non-weightbearing to the right leg. --PT rec SNF, but pt will go home with family  Hypothyroidism --cont Synthroid  Dyslipidemia --cont statin  History of right breast cancer --follow up outpatient radiation oncology   DVT prophylaxis: Lovenox SQ Code Status: Full code  Family Communication: daughter updated at bedside today Status is: inpatient Dispo:   The patient is from: home Anticipated d/c is to: home with [facility] and DME Anticipated d/c date is: tomorrow Patient currently is medically ready to d/c.   Subjective and Interval History:  Pt reported doing better, with some pain.  No N/V/D.  Voiding.   Objective: Vitals:   03/16/20 2348 03/17/20 0358 03/17/20 0740 03/17/20 1157  BP: 138/72 117/62 126/61 118/69  Pulse: (!) 107 91 79 71  Resp:  17 16 16   Temp: 98.5 F (36.9 C) 98.5 F (36.9 C) 98.3 F (36.8 C) 98.5 F (36.9 C)  TempSrc: Oral Oral Oral   SpO2: 97% 96% 96% 99%  Weight:      Height:        Intake/Output Summary (Last 24 hours) at 03/17/2020 2006 Last data filed at 03/17/2020 1823 Gross per 24 hour  Intake 1380 ml  Output --  Net 1380 ml   Filed  Weights   03/16/20 0916  Weight: 72 kg    Examination:   Constitutional: NAD, AAOx3 HEENT: conjunctivae and lids normal, EOMI CV: No cyanosis.   RESP: normal respiratory effort, on RA Extremities: No effusions, edema in LLE.  Right foot and ankle in soft cast SKIN: warm, dry Neuro: II - XII grossly intact.   Psych: Normal mood and affect.  Appropriate judgement and reason   Data Reviewed: I have personally reviewed following labs and imaging studies  CBC: Recent Labs  Lab 03/16/20 1057 03/17/20 0525  WBC 6.9 7.8  NEUTROABS 5.3  --   HGB 13.7 11.0*  HCT 42.2 33.1*  MCV 87.6 86.6  PLT 198 409   Basic Metabolic Panel: Recent Labs  Lab 03/16/20 1057 03/17/20 0525  NA 138 133*  K 4.2 3.9  CL 103 104  CO2 26 22  GLUCOSE 121* 114*  BUN 12 10  CREATININE 0.61 0.49  CALCIUM 9.4 8.1*   GFR: Estimated Creatinine Clearance: 56.3 mL/min (by C-G formula based on SCr of 0.49 mg/dL). Liver Function Tests: No results for input(s): AST, ALT, ALKPHOS, BILITOT, PROT, ALBUMIN in the last 168 hours. No results for input(s): LIPASE, AMYLASE in the last 168 hours. No results for input(s): AMMONIA in the last 168 hours. Coagulation Profile: No results for input(s): INR, PROTIME in the last 168 hours. Cardiac Enzymes: No results for input(s): CKTOTAL, CKMB, CKMBINDEX, TROPONINI in the last 168  hours. BNP (last 3 results) No results for input(s): PROBNP in the last 8760 hours. HbA1C: No results for input(s): HGBA1C in the last 72 hours. CBG: No results for input(s): GLUCAP in the last 168 hours. Lipid Profile: No results for input(s): CHOL, HDL, LDLCALC, TRIG, CHOLHDL, LDLDIRECT in the last 72 hours. Thyroid Function Tests: No results for input(s): TSH, T4TOTAL, FREET4, T3FREE, THYROIDAB in the last 72 hours. Anemia Panel: No results for input(s): VITAMINB12, FOLATE, FERRITIN, TIBC, IRON, RETICCTPCT in the last 72 hours. Sepsis Labs: No results for input(s): PROCALCITON,  LATICACIDVEN in the last 168 hours.  Recent Results (from the past 240 hour(s))  SARS Coronavirus 2 by RT PCR (hospital order, performed in [facility] hospital lab) Nasopharyngeal Nasopharyngeal Swab     Status: None   Collection Time: 03/16/20 10:57 AM   Specimen: Nasopharyngeal Swab  Result Value Ref Range Status   SARS Coronavirus 2 NEGATIVE NEGATIVE Final    Comment: (NOTE) SARS-CoV-2 target nucleic acids are NOT DETECTED.  The SARS-CoV-2 RNA is generally detectable in upper and lower respiratory specimens during the acute phase of infection. The lowest concentration of SARS-CoV-2 viral copies this assay can detect is 250 copies / mL. A negative result does not preclude SARS-CoV-2 infection and should not be used as the sole basis for treatment or other patient management decisions.  A negative result may occur with improper specimen collection / handling, submission of specimen other than nasopharyngeal swab, presence of viral mutation(s) within the areas targeted by this assay, and inadequate number of viral copies (<250 copies / mL). A negative result must be combined with clinical observations, patient history, and epidemiological information.  Fact Sheet for Patients:   StrictlyIdeas.no  Fact Sheet for Healthcare Providers: BankingDealers.co.za  This test is not yet approved or  cleared by the [place] FDA and has been authorized for detection and/or diagnosis of SARS-CoV-2 by FDA under an Emergency Use Authorization (EUA).  This EUA will remain in effect (meaning this test can be used) for the duration of the COVID-19 declaration under Section 564(b)(1) of the Act, 21 U.S.C. section 360bbb-3(b)(1), unless the authorization is terminated or revoked sooner.  Performed at [facility], [address] [place], [place] [address]       Radiology Studies: DG Ankle 2 Views Right  Result Date:  03/16/2020 CLINICAL DATA:  Right ankle ORIF EXAM: RIGHT ANKLE - 2 VIEW; DG C-ARM 1-60 MIN FLUOROSCOPY TIME:  29 seconds COMPARISON:  None. FINDINGS: Multiple intraoperative images demonstrate plate and screw fixation along the lateral aspect of the distal fibula across the previously seen fracture. There is also placement of a screw traversing the medial malleolus fracture. Near anatomic alignment. IMPRESSION: Fluoroscopic guidance for ORIF of right ankle fractures. Electronically Signed   By: Macy Mis M.D.   On: 03/16/2020 14:42   DG Ankle Complete Right  Result Date: 03/16/2020 CLINICAL DATA:  Patient slipped and fell.  Obvious ankle deformity. EXAM: RIGHT ANKLE - COMPLETE 3+ VIEW COMPARISON:  None. FINDINGS: There is an acute ankle trimalleolar fracture/dislocation. Relative to the tibial plafond, the talus is posterolaterally dislocated. There are laterally displaced fractures of the medial and lateral malleoli. Posterior malleolar fracture is posteriorly displaced. No definite tarsal bone fractures, although integrity of the calcaneal tuberosity is difficult to confirm on these views. Associated soft tissue swelling without evidence of foreign body or soft tissue emphysema. IMPRESSION: Acute trimalleolar fracture/dislocation as described. Electronically Signed   By: Caryl Comes.D.  On: 03/16/2020 10:07   DG C-Arm 1-60 Min  Result Date: 03/16/2020 CLINICAL DATA:  Right ankle ORIF EXAM: RIGHT ANKLE - 2 VIEW; DG C-ARM 1-60 MIN FLUOROSCOPY TIME:  29 seconds COMPARISON:  None. FINDINGS: Multiple intraoperative images demonstrate plate and screw fixation along the lateral aspect of the distal fibula across the previously seen fracture. There is also placement of a screw traversing the medial malleolus fracture. Near anatomic alignment. IMPRESSION: Fluoroscopic guidance for ORIF of right ankle fractures. Electronically Signed   By: Macy Mis M.D.   On: 03/16/2020 14:42     Scheduled Meds: .  docusate sodium  100 mg Oral BID  . enoxaparin (LOVENOX) injection  40 mg Subcutaneous Q24H  . levothyroxine  112 mcg Oral Q0600  . multivitamin with minerals  1 tablet Oral Daily  . simvastatin  40 mg Oral QPM  . Tdap  0.5 mL Intramuscular Once   Continuous Infusions: . sodium chloride 75 mL/hr at 03/16/20 1707     LOS: 1 day     Enzo Bi, MD [facility] If 7PM-7AM, please contact night-coverage 03/17/2020, 8:06 PM

## 2020-03-17 NOTE — Discharge Instructions (Signed)
Diet: As you were doing prior to hospitalization   Shower:  May shower but keep the wounds dry, use an occlusive plastic wrap, NO SOAKING IN TUB.  If the bandage gets wet, change with a clean dry gauze.  Dressing:  Leave splint on to the right leg, can loosen if needed.  Activity:  Increase activity slowly as tolerated, but follow the weight bearing instructions below.  No lifting or driving for 6 weeks.  Weight Bearing:   Non-weightbearing to the right leg.  To prevent constipation: you may use a stool softener such as -  Colace (over the counter) 100 mg by mouth twice a day  Drink plenty of fluids (prune juice may be helpful) and high fiber foods Miralax (over the counter) for constipation as needed.    Itching:  If you experience itching with your medications, try taking only a single pain pill, or even half a pain pill at a time.  You may take up to 10 pain pills per day, and you can also use benadryl over the counter for itching or also to help with sleep.   Precautions:  If you experience chest pain or shortness of breath - call 911 immediately for transfer to the hospital emergency department!!  If you develop a fever greater that 101 F, purulent drainage from wound, increased redness or drainage from wound, or calf pain-Call [place]                                              Follow- Up Appointment:  Please call for an appointment to be seen in 2 weeks at [facility]

## 2020-03-17 NOTE — Progress Notes (Signed)
Physical Therapy Treatment Patient Details Name: Tanya Frey MRN: [id number] DOB: Apr 05, 1945 Today's Date: 03/17/2020    History of Present Illness Per MD notes: Pt is a 75 y.o. female with medical history significant for thyroid cancer status post surgical resection, history of breast cancer getting radiotherapy, and anemia of chronic disease who presents to the emergency room for evaluation following a fall.  Pt diagnosed with open comminuted trimalleolar fracture dislocation of the right ankle and is s/p ORIF.    PT Comments    Pt was pleasant and motivated to participate during the session.  Pt required extra time and effort with bed mobility tasks and min A with transfers and gait.  Pt provided extensive practice with knee scooter secondary to family member stating that one was purchased for the pt to use at home.  Pt demonstrated fair stability getting onto and off of the scooter as well as during practice navigating in the hall and in tight spaces in her room.  Pt did demonstrate poor activity tolerance during knee scooter practice and required rest breaks after 60-80 feet.  Pt practice provided with w/c mobility including forwards and backwards navigation as well as navigating tight spaces.  Pt demonstrated good carryover of proper sequencing with w/c mobility and was able to traverse greater distances with decreased fatigue compared to the knee scooter.  Ambulation was challenging for the patient and although she was able to maintain WB compliance throughout she demonstrated poor clearance when advancing the LLE and would likely have difficulty managing the stairs at home safely.  Will attempt stair training during a future session as appropriate.  Pt will benefit from PT services in a SNF setting upon discharge to safely address deficits listed in patient problem list for decreased caregiver assistance and eventual return to PLOF.      Follow Up Recommendations  SNF;Supervision for  mobility/OOB     Equipment Recommendations  Wheelchair (measurements PT);3in1 (PT);Rolling walker with 5" wheels    Recommendations for Other Services       Precautions / Restrictions Precautions Precautions: Fall Restrictions Weight Bearing Restrictions: Yes RLE Weight Bearing: Non weight bearing    Mobility  Bed Mobility Overal bed mobility: Modified Independent             General bed mobility comments: Extra time and effort but no physical assistance needed  Transfers Overall transfer level: Needs assistance Equipment used: Rolling walker (2 wheeled);None Transfers: Sit to/from [facility] Sit to Stand: From elevated surface;Min assist Stand pivot transfers: Min assist;From elevated surface       General transfer comment: Mod verbal and visual cues for proper sequencing for WB compliance from various height surfaces  Ambulation/Gait Ambulation/Gait assistance: Min assist Gait Distance (Feet): 4 Feet Assistive device: Rolling walker (2 wheeled)   Gait velocity: decreased   General Gait Details: Hop-to gait with mod verbal and visual cues/training for proper sequencing with pt and daughter; min A for stability and RW management with good WB compliance throughout   Stairs             Wheelchair Mobility    Modified Rankin (Stroke Patients Only)       Balance Overall balance assessment: Needs assistance Sitting-balance support: Single extremity supported;Feet unsupported Sitting balance-Leahy Scale: Good     Standing balance support: Bilateral upper extremity supported;During functional activity Standing balance-Leahy Scale: Poor Standing balance comment: Min A for stability in standing  Cognition Arousal/Alertness: Awake/alert Behavior During Therapy: WFL for tasks assessed/performed Overall Cognitive Status: Within Functional Limits for tasks assessed                                         Exercises Total Joint Exercises Ankle Circles/Pumps: AROM;Left;10 reps Quad Sets: Strengthening;Both;10 reps Hip ABduction/ADduction: Strengthening;Both;5 reps Straight Leg Raises: Strengthening;Both;5 reps Long Arc Quad: AROM;Strengthening;Both;5 reps;10 reps Knee Flexion: AROM;Strengthening;Both;5 reps;10 reps Other Exercises: SPT training from various surfaces with pt and daughter  Other Exercises: Knee scooter training with pt and daughter including forwards, backwards, and navigating tight spaces Other Exercises: wheelchair training with pt and daughter (including set up/break down of chair with daughter) with pt practicing navigating forwards, backwards, and in tight spaces    General Comments        Pertinent Vitals/Pain Pain Assessment: No/denies pain    Home Living                      Prior Function            PT Goals (current goals can now be found in the care plan section) Progress towards PT goals: Progressing toward goals    Frequency    BID      PT Plan Current plan remains appropriate    Co-evaluation              AM-PAC PT "6 Clicks" Mobility   Outcome Measure  Help needed turning from your back to your side while in a flat bed without using bedrails?: A Little Help needed moving from lying on your back to sitting on the side of a flat bed without using bedrails?: A Little Help needed moving to and from a bed to a chair (including a wheelchair)?: A Little Help needed standing up from a chair using your arms (e.g., wheelchair or bedside chair)?: A Little Help needed to walk in hospital room?: A Lot Help needed climbing 3-5 steps with a railing? : Total 6 Click Score: 15    End of Session Equipment Utilized During Treatment: Gait belt Activity Tolerance: Patient tolerated treatment well Patient left: in chair;with call bell/phone within reach;with chair alarm set;with family/visitor present;with SCD's  reapplied Nurse Communication: Mobility status;Weight bearing status PT Visit Diagnosis: Unsteadiness on feet (R26.81);History of falling (Z91.81);Other abnormalities of gait and mobility (R26.89);Muscle weakness (generalized) (M62.81)     Time: 7867-6720 PT Time Calculation (min) (ACUTE ONLY): 53 min  Charges:  $Gait Training: 8-22 mins $Therapeutic Exercise: 8-22 mins $Therapeutic Activity: 23-37 mins                     D. Scott Zarin Knupp PT, DPT 03/17/20, 5:01 PM

## 2020-03-18 ENCOUNTER — Ambulatory Visit: Payer: Medicare HMO

## 2020-03-18 ENCOUNTER — Telehealth: Payer: Self-pay | Admitting: Radiation Oncology

## 2020-03-18 DIAGNOSIS — E039 Hypothyroidism, unspecified: Secondary | ICD-10-CM | POA: Diagnosis not present

## 2020-03-18 DIAGNOSIS — C50919 Malignant neoplasm of unspecified site of unspecified female breast: Secondary | ICD-10-CM | POA: Diagnosis not present

## 2020-03-18 DIAGNOSIS — S82891B Other fracture of right lower leg, initial encounter for open fracture type I or II: Secondary | ICD-10-CM | POA: Diagnosis not present

## 2020-03-18 LAB — BASIC METABOLIC PANEL
Anion gap: 8 (ref 5–15)
BUN: 9 mg/dL (ref 8–23)
CO2: 24 mmol/L (ref 22–32)
Calcium: 8.2 mg/dL — ABNORMAL LOW (ref 8.9–10.3)
Chloride: 103 mmol/L (ref 98–111)
Creatinine, Ser: 0.46 mg/dL (ref 0.44–1.00)
GFR, Estimated: >60 mL/min (ref >60)
Glucose, Bld: 106 mg/dL — ABNORMAL HIGH (ref 70–99)
Potassium: 3.8 mmol/L (ref 3.5–5.1)
Sodium: 135 mmol/L (ref 135–145)

## 2020-03-18 LAB — MAGNESIUM: Magnesium: 2 mg/dL (ref 1.7–2.4)

## 2020-03-18 LAB — CBC
HCT: 33.6 % — ABNORMAL LOW (ref 36.0–46.0)
Hemoglobin: 11.4 g/dL — ABNORMAL LOW (ref 12.0–15.0)
MCH: 29.2 pg (ref 26.0–34.0)
MCHC: 33.9 g/dL (ref 30.0–36.0)
MCV: 86.2 fL (ref 80.0–100.0)
Platelets: 161 10*3/uL (ref 150–400)
RBC: 3.9 MIL/uL (ref 3.87–5.11)
RDW: 14.8 % (ref 11.5–15.5)
WBC: 5.8 10*3/uL (ref 4.0–10.5)
nRBC: 0 % (ref 0.0–0.2)

## 2020-03-18 MED ORDER — TRAMADOL HCL 50 MG PO TABS: 50.0000 mg | ORAL_TABLET | Freq: Two times a day (BID) | ORAL | 0 refills | Status: DC | PRN

## 2020-03-18 NOTE — Telephone Encounter (Signed)
Pt's daughter Abigail Butts) left VM regarding missed radiation appts due to pt breaking her ankle. She would like a return phone call to discuss.

## 2020-03-18 NOTE — Progress Notes (Signed)
Physical Therapy Treatment Patient Details Name: Tanya Frey MRN: [id number] DOB: 07/22/1945 Today's Date: 03/18/2020    History of Present Illness Per MD notes: Pt is a 75 y.o. female with medical history significant for thyroid cancer status post surgical resection, history of breast cancer getting radiotherapy, and anemia of chronic disease who presents to the emergency room for evaluation following a fall.  Pt diagnosed with open comminuted trimalleolar fracture dislocation of the right ankle and is s/p ORIF.    PT Comments    Pt was pleasant and motivated to participate during the session.  Per daughter family has created short ramp into home.  Education provided on risks associated with short non-ADA compliant ramps with daughter stating would have multiple people assisting patient.  Extensive training provided on proper sequencing for transfers and ambulation as well as sequencing in and out of a car.  Resources provided for possible transportation services to assist pt with frequent trips for radiation.  Pt will benefit from PT services in a SNF setting upon discharge to safely address deficits listed in patient problem list for decreased caregiver assistance and eventual return to PLOF.     Follow Up Recommendations  SNF;Supervision for mobility/OOB     Equipment Recommendations  Wheelchair (measurements PT);3in1 (PT);Rolling walker with 5" wheels    Recommendations for Other Services       Precautions / Restrictions Precautions Precautions: Fall Restrictions Weight Bearing Restrictions: Yes RLE Weight Bearing: Non weight bearing    Mobility  Bed Mobility               General bed mobility comments: NT, pt found in recliner  Transfers Overall transfer level: Needs assistance Equipment used: Rolling walker (2 wheeled);None   Sit to Stand: From elevated surface;Min assist         General transfer comment: Mod verbal and visual cues for proper sequencing  for WB compliance from various height surfaces with pt's daughter present for training  Ambulation/Gait Ambulation/Gait assistance: Min guard Gait Distance (Feet): 5 Feet x 2, 90 deg turns x 4 Assistive device: Rolling walker (2 wheeled)   Gait velocity: decreased   General Gait Details: Hop-to gait with mod verbal and visual cues/training for proper sequencing with pt and daughter; grossly improved stability this session   Stairs             Wheelchair Mobility    Modified Rankin (Stroke Patients Only)       Balance Overall balance assessment: Needs assistance Sitting-balance support: Single extremity supported;Feet unsupported Sitting balance-Leahy Scale: Good     Standing balance support: Bilateral upper extremity supported;During functional activity Standing balance-Leahy Scale: Fair                              Cognition Arousal/Alertness: Awake/alert Behavior During Therapy: WFL for tasks assessed/performed Overall Cognitive Status: Within Functional Limits for tasks assessed                                        Exercises Total Joint Exercises Ankle Circles/Pumps: AROM;Left;10 reps Quad Sets: Strengthening;Both;10 reps Gluteal Sets: Strengthening;Both;10 reps Long Arc Quad: AROM;Strengthening;Both;5 reps;10 reps Knee Flexion: AROM;Strengthening;Both;5 reps;10 reps Other Exercises Other Exercises: Car transfer sequencing verbal and visual education using room chair for simulation Other Exercises: Extensive transfer training with patient and daughter for increased trunk flexion and  correct hand placement Other Exercises: Education provided with pt/daughter on safety up/down ramp with w/c and risk associated with steep ramps    General Comments        Pertinent Vitals/Pain Pain Assessment: No/denies pain    Home Living                      Prior Function            PT Goals (current goals can now be found  in the care plan section) Progress towards PT goals: Progressing toward goals    Frequency    BID      PT Plan Current plan remains appropriate    Co-evaluation              AM-PAC PT "6 Clicks" Mobility   Outcome Measure  Help needed turning from your back to your side while in a flat bed without using bedrails?: A Little Help needed moving from lying on your back to sitting on the side of a flat bed without using bedrails?: A Little Help needed moving to and from a bed to a chair (including a wheelchair)?: A Little Help needed standing up from a chair using your arms (e.g., wheelchair or bedside chair)?: A Little Help needed to walk in hospital room?: A Lot Help needed climbing 3-5 steps with a railing? : Total 6 Click Score: 15    End of Session Equipment Utilized During Treatment: Gait belt Activity Tolerance: Patient tolerated treatment well Patient left: in chair;with call bell/phone within reach;with chair alarm set;with family/visitor present;with SCD's reapplied Nurse Communication: Mobility status;Weight bearing status PT Visit Diagnosis: Unsteadiness on feet (R26.81);History of falling (Z91.81);Other abnormalities of gait and mobility (R26.89);Muscle weakness (generalized) (M62.81)     Time: 7673-4193 PT Time Calculation (min) (ACUTE ONLY): 42 min  Charges:  $Gait Training: 8-22 mins $Therapeutic Exercise: 8-22 mins $Therapeutic Activity: 8-22 mins                     D. Scott Breeana Sawtelle PT, DPT 03/18/20, 11:16 AM

## 2020-03-18 NOTE — Plan of Care (Signed)

## 2020-03-18 NOTE — Discharge Summary (Signed)
Tanya Frey NAME: Tanya Frey    MR#:  [id number]  DATE OF BIRTH:  02/12/1945  DATE OF ADMISSION:  03/16/2020 ADMITTING PHYSICIAN: Collier Bullock, MD  DATE OF DISCHARGE: 03/18/2020  PRIMARY CARE PHYSICIAN: [name], MD    ADMISSION DIAGNOSIS:  Surgery, elective [Z41.9] Open ankle fracture, right, type I or II, initial encounter [S82.891B] Type I or II open trimalleolar fracture of right ankle, initial encounter [S82.851B]  DISCHARGE DIAGNOSIS:   Open ankle fracture right status post fall. Patient is status post open reduction internal fixation on February 7 SECONDARY DIAGNOSIS:   Past Medical History:  Diagnosis Date  . Anemia   . Difficult intubation   . Diffuse cystic mastopathy   . Family history of malignant neoplasm of breast   . Hyperlipidemia   . Obesity, unspecified   . Personal history of tobacco use, presenting hazards to health   . Thyroid cancer (HCC)    T4a, NX: 4.6 cm lesion with extrathyroidal extension. Received I 131 post procedure.    . Varicose veins     HOSPITAL COURSE:  Tanya Frey a 75 y.o.femalewith medical history significant forthyroid cancer status post surgical resection, history of breast cancer getting radiotherapy, anemia of chronic disease who presents to the emergency room for evaluation following a fall morning of her admission. Patient slipped on ice while trying to get the paper on the morning of her admission.    Open ankle fracture,right ankle S/p OPEN REDUCTION INTERNAL FIXATION on 2/7 --Will follow-up with [facility] orthopaedics in 10-14 days for staple removal and repeat x-rays. --Aspirin for DVT prophylaxis upon discharge. --Non-weightbearing to the right leg. --PT rec SNF, but pt will go home with family--d/w dter Abigail Butts in the room --Per Ortho ASA 325 mg po qd for DVT prophylaxis  Hypothyroidism --cont Synthroid  Dyslipidemia --cont statin  History of  right breast cancer --follow up outpatient radiation oncology   DVT prophylaxis: Lovenox SQ Code Status: Full code  Family Communication: daughter  Lind Covert at bedside today Status is: inpatient Dispo:   The patient is from: home Anticipated d/c is to: home with [facility] and DME Anticipated d/c date is: today Patient currently is medically ready to d/c.  CONSULTS OBTAINED:  Treatment Team:  Poggi, Marshall Cork, MD  DRUG ALLERGIES:   Allergies  Allergen Reactions  . Cefdinir Rash    Pt tolerated exposure to cefazolin  . Protonix [Pantoprazole Sodium] Rash    DISCHARGE MEDICATIONS:   Allergies as of 03/18/2020      Reactions   Cefdinir Rash   Pt tolerated exposure to cefazolin   Protonix [pantoprazole Sodium] Rash      Medication List    STOP taking these medications   aspirin 325 MG tablet Replaced by: aspirin EC 325 MG tablet     TAKE these medications   aspirin EC 325 MG tablet Take 1 tablet (325 mg total) by mouth 2 (two) times daily. Replaces: aspirin 325 MG tablet   CALCIUM 1200+D3 PO Take 1,200 mg by mouth daily.   HYDROcodone-acetaminophen 5-325 MG tablet Commonly known as: NORCO/VICODIN Take 1-2 tablets by mouth every 4 (four) hours as needed for moderate pain.   levothyroxine 112 MCG tablet Commonly known as: SYNTHROID TAKE 1 TABLET(112 MCG) BY MOUTH DAILY BEFORE AND BREAKFAST   methocarbamol 500 MG tablet Commonly known as: Robaxin Take 1 tablet (500 mg total) by mouth every 8 (eight) hours as needed for muscle spasms.  multivitamin with minerals tablet Take 1 tablet by mouth daily.   simvastatin 40 MG tablet Commonly known as: ZOCOR Take 40 mg by mouth every evening.   traMADol 50 MG tablet Commonly known as: ULTRAM Take 1 tablet (50 mg total) by mouth every 12 (twelve) hours as needed for moderate pain.            Durable Medical Equipment  (From admission, onward)         Start     Ordered   03/17/20 1419  For home use only DME  3 n 1  Once        03/17/20 1419   03/17/20 1418  For home use only DME standard manual wheelchair with seat cushion  Once       Comments: Patient suffers from right Open ankle fracture, which impairs their ability to perform daily activities in the home.  A walker will not resolve issue with performing activities of daily living. A wheelchair will allow patient to safely perform daily activities. Patient can safely propel the wheelchair in the home or has a caregiver who can provide assistance. Length of need 6 months . Accessories: elevating leg rests (ELRs), wheel locks, extensions and anti-tippers.   03/17/20 1418           Discharge Care Instructions  (From admission, onward)         Start     Ordered   03/18/20 0000  Discharge wound care:       Comments: Reinforced dressing until discontinued or orthopedic instruction   03/18/20 1008          If you experience worsening of your admission symptoms, develop shortness of breath, life threatening emergency, suicidal or homicidal thoughts you must seek medical attention immediately by calling 911 or calling your MD immediately  if symptoms less severe.  You Must read complete instructions/literature along with all the possible adverse reactions/side effects for all the Medicines you take and that have been prescribed to you. Take any new Medicines after you have completely understood and accept all the possible adverse reactions/side effects.   Please note  You were cared for by a hospitalist during your hospital stay. If you have any questions about your discharge medications or the care you received while you were in the hospital after you are discharged, you can call the unit and asked to speak with the hospitalist on call if the hospitalist that took care of you is not available. Once you are discharged, your primary care physician will handle any further medical issues. Please note that NO REFILLS for any discharge medications  will be authorized once you are discharged, as it is imperative that you return to your primary care physician (or establish a relationship with a primary care physician if you do not have one) for your aftercare needs so that they can reassess your need for medications and monitor your lab values. Today   SUBJECTIVE   Doing well Out int he chair dter Abigail Butts in the room  VITAL SIGNS:  Blood pressure (!) 114/56, pulse 70, temperature 99.1 F (37.3 C), resp. rate 18, height 5' (1.524 m), weight 72 kg, SpO2 99 %.  I/O:    Intake/Output Summary (Last 24 hours) at 03/18/2020 1009 Last data filed at 03/18/2020 1001 Gross per 24 hour  Intake 840 ml  Output 200 ml  Net 640 ml    PHYSICAL EXAMINATION:  GENERAL:  75 y.o.-year-old patient lying in the bed with no  acute distress.  LUNGS: Normal breath sounds bilaterally, no wheezing, rales,rhonchi or crepitation. No use of accessory muscles of respiration.  CARDIOVASCULAR: S1, S2 normal. No murmurs, rubs, or gallops.  ABDOMEN: Soft, non-tender, non-distended. Bowel sounds present. No organomegaly or mass.  EXTREMITIES: Right LE cast+ NEUROLOGIC: Cranial nerves II through XII are intact. Muscle strength 5/5 in all extremities. Sensation intact. Gait not checked.  PSYCHIATRIC: The patient is alert and oriented x 3.  SKIN: No obvious rash, lesion, or ulcer.   DATA REVIEW:   CBC  Recent Labs  Lab 03/18/20 0538  WBC 5.8  HGB 11.4*  HCT 33.6*  PLT 161    Chemistries  Recent Labs  Lab 03/18/20 0538  NA 135  K 3.8  CL 103  CO2 24  GLUCOSE 106*  BUN 9  CREATININE 0.46  CALCIUM 8.2*  MG 2.0    Microbiology Results   Recent Results (from the past 240 hour(s))  SARS Coronavirus 2 by RT PCR (hospital order, performed in [facility] hospital lab) Nasopharyngeal Nasopharyngeal Swab     Status: None   Collection Time: 03/16/20 10:57 AM   Specimen: Nasopharyngeal Swab  Result Value Ref Range Status   SARS Coronavirus 2 NEGATIVE  NEGATIVE Final    Comment: (NOTE) SARS-CoV-2 target nucleic acids are NOT DETECTED.  The SARS-CoV-2 RNA is generally detectable in upper and lower respiratory specimens during the acute phase of infection. The lowest concentration of SARS-CoV-2 viral copies this assay can detect is 250 copies / mL. A negative result does not preclude SARS-CoV-2 infection and should not be used as the sole basis for treatment or other patient management decisions.  A negative result may occur with improper specimen collection / handling, submission of specimen other than nasopharyngeal swab, presence of viral mutation(s) within the areas targeted by this assay, and inadequate number of viral copies (<250 copies / mL). A negative result must be combined with clinical observations, patient history, and epidemiological information.  Fact Sheet for Patients:   StrictlyIdeas.no  Fact Sheet for Healthcare Providers: BankingDealers.co.za  This test is not yet approved or  cleared by the [place] FDA and has been authorized for detection and/or diagnosis of SARS-CoV-2 by FDA under an Emergency Use Authorization (EUA).  This EUA will remain in effect (meaning this test can be used) for the duration of the COVID-19 declaration under Section 564(b)(1) of the Act, 21 U.S.C. section 360bbb-3(b)(1), unless the authorization is terminated or revoked sooner.  Performed at [facility], [address] [place], [place] [address]     RADIOLOGY:  DG Ankle 2 Views Right  Result Date: 03/16/2020 CLINICAL DATA:  Right ankle ORIF EXAM: RIGHT ANKLE - 2 VIEW; DG C-ARM 1-60 MIN FLUOROSCOPY TIME:  29 seconds COMPARISON:  None. FINDINGS: Multiple intraoperative images demonstrate plate and screw fixation along the lateral aspect of the distal fibula across the previously seen fracture. There is also placement of a screw traversing the medial malleolus fracture.  Near anatomic alignment. IMPRESSION: Fluoroscopic guidance for ORIF of right ankle fractures. Electronically Signed   By: Macy Mis M.D.   On: 03/16/2020 14:42   DG C-Arm 1-60 Min  Result Date: 03/16/2020 CLINICAL DATA:  Right ankle ORIF EXAM: RIGHT ANKLE - 2 VIEW; DG C-ARM 1-60 MIN FLUOROSCOPY TIME:  29 seconds COMPARISON:  None. FINDINGS: Multiple intraoperative images demonstrate plate and screw fixation along the lateral aspect of the distal fibula across the previously seen fracture. There is also placement of a screw  traversing the medial malleolus fracture. Near anatomic alignment. IMPRESSION: Fluoroscopic guidance for ORIF of right ankle fractures. Electronically Signed   By: Macy Mis M.D.   On: 03/16/2020 14:42     CODE STATUS:     Code Status Orders  (From admission, onward)         Start     Ordered   03/16/20 1250  Full code  Continuous        03/16/20 1251        Code Status History    Date Active Date Inactive Code Status Order ID Comments User Context   02/21/2017 1122 02/27/2017 2103 Full Code [id number]  [name], MD Inpatient   Advance Care Planning Activity       TOTAL TIME TAKING CARE OF THIS PATIENT: *35 minutes.    Fritzi Mandes M.D  [facility]    CC: Primary care physician; [name], MD

## 2020-03-18 NOTE — Progress Notes (Signed)
Subjective: 2 Days Post-Op Procedure(s) (LRB): OPEN REDUCTION INTERNAL FIXATION (ORIF) ANKLE FRACTURE (Right) Patient reports pain as 0/10. Patient is well, and has had no acute complaints or problems Plan is to go Home after hospital stay pending progress with PT. Negative for chest pain and shortness of breath Fever: no Gastrointestinal:Negative for nausea and vomiting Patient has had a bowel movement following surgery.  Objective: Vital signs in last 24 hours: Temp:  [98.5 F (36.9 C)-99.1 F (37.3 C)] 99.1 F (37.3 C) (02/09 0441) Pulse Rate:  [71-95] 95 (02/09 0441) Resp:  [16-18] 18 (02/09 0441) BP: (118-145)/(69-75) 134/73 (02/09 0441) SpO2:  [96 %-99 %] 96 % (02/09 0441)  Intake/Output from previous day:  Intake/Output Summary (Last 24 hours) at 03/18/2020 0810 Last data filed at 03/17/2020 2045 Gross per 24 hour  Intake 1380 ml  Output 200 ml  Net 1180 ml    Intake/Output this shift: No intake/output data recorded.  Labs: Recent Labs    03/16/20 1057 03/17/20 0525 03/18/20 0538  HGB 13.7 11.0* 11.4*   Recent Labs    03/17/20 0525 03/18/20 0538  WBC 7.8 5.8  RBC 3.82* 3.90  HCT 33.1* 33.6*  PLT 166 161   Recent Labs    03/17/20 0525 03/18/20 0538  NA 133* 135  K 3.9 3.8  CL 104 103  CO2 22 24  BUN 10 9  CREATININE 0.49 0.46  GLUCOSE 114* 106*  CALCIUM 8.1* 8.2*   No results for input(s): LABPT, INR in the last 72 hours.   EXAM General - Patient is Alert, Appropriate and Oriented Extremity - Incision: dressing C/D/I and no drainage  Short leg splint intact to the right leg. Intact to light touch to the toes and proximal splint. Cap refill intact to each toe. Calf is soft to palpation. Motor Function - intact, moving toes on exam.  Past Medical History:  Diagnosis Date  . Anemia   . Difficult intubation   . Diffuse cystic mastopathy   . Family history of malignant neoplasm of breast   . Hyperlipidemia   . Obesity, unspecified   .  Personal history of tobacco use, presenting hazards to health   . Thyroid cancer (HCC)    T4a, NX: 4.6 cm lesion with extrathyroidal extension. Received I 131 post procedure.    . Varicose veins     Assessment/Plan: 2 Days Post-Op Procedure(s) (LRB): OPEN REDUCTION INTERNAL FIXATION (ORIF) ANKLE FRACTURE (Right) Active Problems:   Open ankle fracture, right, type I or II, initial encounter  Estimated body mass index is 31 kg/m as calculated from the following:   Height as of this encounter: 5' (1.524 m).   Weight as of this encounter: 72 kg. Up with therapy, would like to see more progress with PT prior to discharge home. Possible discharge home today. Labs and vital signs stable Pain well controlled  Will follow-up with [facility] orthopaedics in 10-14 days for staple removal and repeat x-rays. Aspirin for DVT prophylaxis upon discharge.  DVT Prophylaxis - Lovenox and SCDs Non-weightbearing to the right leg.  Duanne Guess, PA-C [facility] 03/18/2020, 8:10 AM

## 2020-03-19 ENCOUNTER — Ambulatory Visit
Admission: RE | Admit: 2020-03-19 | Discharge: 2020-03-19 | Disposition: A | Discharge status: Home / Self Care | Payer: Medicare HMO | Source: Ambulatory Visit | Attending: Radiation Oncology | Admitting: Radiation Oncology

## 2020-03-19 ENCOUNTER — Ambulatory Visit: Payer: Medicare HMO

## 2020-03-19 DIAGNOSIS — Z510 Encounter for antineoplastic radiation therapy: Secondary | ICD-10-CM | POA: Diagnosis not present

## 2020-03-19 DIAGNOSIS — C50411 Malignant neoplasm of upper-outer quadrant of right female breast: Secondary | ICD-10-CM | POA: Diagnosis not present

## 2020-03-19 DIAGNOSIS — Z170 Estrogen receptor positive status [ER+]: Secondary | ICD-10-CM | POA: Diagnosis not present

## 2020-03-20 ENCOUNTER — Ambulatory Visit
Admission: RE | Admit: 2020-03-20 | Discharge: 2020-03-20 | Disposition: A | Discharge status: Home / Self Care | Payer: Medicare HMO | Source: Ambulatory Visit | Attending: Radiation Oncology | Admitting: Radiation Oncology

## 2020-03-20 ENCOUNTER — Ambulatory Visit: Payer: Medicare HMO

## 2020-03-20 DIAGNOSIS — Z510 Encounter for antineoplastic radiation therapy: Secondary | ICD-10-CM | POA: Diagnosis not present

## 2020-03-20 DIAGNOSIS — C50411 Malignant neoplasm of upper-outer quadrant of right female breast: Secondary | ICD-10-CM | POA: Diagnosis not present

## 2020-03-20 DIAGNOSIS — Z170 Estrogen receptor positive status [ER+]: Secondary | ICD-10-CM | POA: Diagnosis not present

## 2020-03-21 DIAGNOSIS — Z87891 Personal history of nicotine dependence: Secondary | ICD-10-CM | POA: Diagnosis not present

## 2020-03-21 DIAGNOSIS — E785 Hyperlipidemia, unspecified: Secondary | ICD-10-CM | POA: Diagnosis not present

## 2020-03-21 DIAGNOSIS — M8580 Other specified disorders of bone density and structure, unspecified site: Secondary | ICD-10-CM | POA: Diagnosis not present

## 2020-03-21 DIAGNOSIS — Z853 Personal history of malignant neoplasm of breast: Secondary | ICD-10-CM | POA: Diagnosis not present

## 2020-03-21 DIAGNOSIS — D649 Anemia, unspecified: Secondary | ICD-10-CM | POA: Diagnosis not present

## 2020-03-21 DIAGNOSIS — S82851E Displaced trimalleolar fracture of right lower leg, subsequent encounter for open fracture type I or II with routine healing: Secondary | ICD-10-CM | POA: Diagnosis not present

## 2020-03-21 DIAGNOSIS — F1099 Alcohol use, unspecified with unspecified alcohol-induced disorder: Secondary | ICD-10-CM | POA: Diagnosis not present

## 2020-03-21 DIAGNOSIS — E039 Hypothyroidism, unspecified: Secondary | ICD-10-CM | POA: Diagnosis not present

## 2020-03-21 DIAGNOSIS — E669 Obesity, unspecified: Secondary | ICD-10-CM | POA: Diagnosis not present

## 2020-03-23 ENCOUNTER — Ambulatory Visit: Payer: Medicare HMO

## 2020-03-23 ENCOUNTER — Ambulatory Visit
Admission: RE | Admit: 2020-03-23 | Discharge: 2020-03-23 | Disposition: A | Discharge status: Home / Self Care | Payer: Medicare HMO | Source: Ambulatory Visit | Attending: Radiation Oncology | Admitting: Radiation Oncology

## 2020-03-23 DIAGNOSIS — D649 Anemia, unspecified: Secondary | ICD-10-CM | POA: Diagnosis not present

## 2020-03-23 DIAGNOSIS — Z853 Personal history of malignant neoplasm of breast: Secondary | ICD-10-CM | POA: Diagnosis not present

## 2020-03-23 DIAGNOSIS — F1099 Alcohol use, unspecified with unspecified alcohol-induced disorder: Secondary | ICD-10-CM | POA: Diagnosis not present

## 2020-03-23 DIAGNOSIS — Z87891 Personal history of nicotine dependence: Secondary | ICD-10-CM | POA: Diagnosis not present

## 2020-03-23 DIAGNOSIS — R000 Tachycardia, unspecified: Secondary | ICD-10-CM | POA: Diagnosis not present

## 2020-03-23 DIAGNOSIS — E039 Hypothyroidism, unspecified: Secondary | ICD-10-CM | POA: Diagnosis not present

## 2020-03-23 DIAGNOSIS — Z170 Estrogen receptor positive status [ER+]: Secondary | ICD-10-CM | POA: Diagnosis not present

## 2020-03-23 DIAGNOSIS — Z510 Encounter for antineoplastic radiation therapy: Secondary | ICD-10-CM | POA: Diagnosis not present

## 2020-03-23 DIAGNOSIS — S82851E Displaced trimalleolar fracture of right lower leg, subsequent encounter for open fracture type I or II with routine healing: Secondary | ICD-10-CM | POA: Diagnosis not present

## 2020-03-23 DIAGNOSIS — E785 Hyperlipidemia, unspecified: Secondary | ICD-10-CM | POA: Diagnosis not present

## 2020-03-23 DIAGNOSIS — Z09 Encounter for follow-up examination after completed treatment for conditions other than malignant neoplasm: Secondary | ICD-10-CM | POA: Diagnosis not present

## 2020-03-23 DIAGNOSIS — R300 Dysuria: Secondary | ICD-10-CM | POA: Diagnosis not present

## 2020-03-23 DIAGNOSIS — E669 Obesity, unspecified: Secondary | ICD-10-CM | POA: Diagnosis not present

## 2020-03-23 DIAGNOSIS — C50411 Malignant neoplasm of upper-outer quadrant of right female breast: Secondary | ICD-10-CM | POA: Diagnosis not present

## 2020-03-23 DIAGNOSIS — M8580 Other specified disorders of bone density and structure, unspecified site: Secondary | ICD-10-CM | POA: Diagnosis not present

## 2020-03-24 ENCOUNTER — Ambulatory Visit
Admission: RE | Admit: 2020-03-24 | Discharge: 2020-03-24 | Disposition: A | Discharge status: Home / Self Care | Payer: Medicare HMO | Source: Ambulatory Visit | Attending: Radiation Oncology | Admitting: Radiation Oncology

## 2020-03-24 ENCOUNTER — Ambulatory Visit: Payer: Medicare HMO

## 2020-03-24 DIAGNOSIS — Z510 Encounter for antineoplastic radiation therapy: Secondary | ICD-10-CM | POA: Diagnosis not present

## 2020-03-24 DIAGNOSIS — Z170 Estrogen receptor positive status [ER+]: Secondary | ICD-10-CM | POA: Diagnosis not present

## 2020-03-24 DIAGNOSIS — C50411 Malignant neoplasm of upper-outer quadrant of right female breast: Secondary | ICD-10-CM | POA: Diagnosis not present

## 2020-03-25 ENCOUNTER — Ambulatory Visit
Admission: RE | Admit: 2020-03-25 | Discharge: 2020-03-25 | Disposition: A | Discharge status: Home / Self Care | Payer: Medicare HMO | Source: Ambulatory Visit | Attending: Radiation Oncology | Admitting: Radiation Oncology

## 2020-03-25 DIAGNOSIS — Z170 Estrogen receptor positive status [ER+]: Secondary | ICD-10-CM | POA: Diagnosis not present

## 2020-03-25 DIAGNOSIS — C50411 Malignant neoplasm of upper-outer quadrant of right female breast: Secondary | ICD-10-CM | POA: Diagnosis not present

## 2020-03-25 DIAGNOSIS — Z510 Encounter for antineoplastic radiation therapy: Secondary | ICD-10-CM | POA: Diagnosis not present

## 2020-03-26 ENCOUNTER — Ambulatory Visit: Payer: Medicare HMO

## 2020-03-26 ENCOUNTER — Ambulatory Visit
Admission: RE | Admit: 2020-03-26 | Discharge: 2020-03-26 | Disposition: A | Discharge status: Home / Self Care | Payer: Medicare HMO | Source: Ambulatory Visit | Attending: Radiation Oncology | Admitting: Radiation Oncology

## 2020-03-26 DIAGNOSIS — Z170 Estrogen receptor positive status [ER+]: Secondary | ICD-10-CM | POA: Diagnosis not present

## 2020-03-26 DIAGNOSIS — F1099 Alcohol use, unspecified with unspecified alcohol-induced disorder: Secondary | ICD-10-CM | POA: Diagnosis not present

## 2020-03-26 DIAGNOSIS — Z87891 Personal history of nicotine dependence: Secondary | ICD-10-CM | POA: Diagnosis not present

## 2020-03-26 DIAGNOSIS — E039 Hypothyroidism, unspecified: Secondary | ICD-10-CM | POA: Diagnosis not present

## 2020-03-26 DIAGNOSIS — S82851E Displaced trimalleolar fracture of right lower leg, subsequent encounter for open fracture type I or II with routine healing: Secondary | ICD-10-CM | POA: Diagnosis not present

## 2020-03-26 DIAGNOSIS — D649 Anemia, unspecified: Secondary | ICD-10-CM | POA: Diagnosis not present

## 2020-03-26 DIAGNOSIS — C50411 Malignant neoplasm of upper-outer quadrant of right female breast: Secondary | ICD-10-CM | POA: Diagnosis not present

## 2020-03-26 DIAGNOSIS — M8580 Other specified disorders of bone density and structure, unspecified site: Secondary | ICD-10-CM | POA: Diagnosis not present

## 2020-03-26 DIAGNOSIS — E669 Obesity, unspecified: Secondary | ICD-10-CM | POA: Diagnosis not present

## 2020-03-26 DIAGNOSIS — Z853 Personal history of malignant neoplasm of breast: Secondary | ICD-10-CM | POA: Diagnosis not present

## 2020-03-26 DIAGNOSIS — E785 Hyperlipidemia, unspecified: Secondary | ICD-10-CM | POA: Diagnosis not present

## 2020-03-26 DIAGNOSIS — Z510 Encounter for antineoplastic radiation therapy: Secondary | ICD-10-CM | POA: Diagnosis not present

## 2020-03-27 ENCOUNTER — Ambulatory Visit
Admission: RE | Admit: 2020-03-27 | Discharge: 2020-03-27 | Disposition: A | Discharge status: Home / Self Care | Payer: Medicare HMO | Source: Ambulatory Visit | Attending: Radiation Oncology | Admitting: Radiation Oncology

## 2020-03-27 DIAGNOSIS — Z170 Estrogen receptor positive status [ER+]: Secondary | ICD-10-CM | POA: Diagnosis not present

## 2020-03-27 DIAGNOSIS — C50411 Malignant neoplasm of upper-outer quadrant of right female breast: Secondary | ICD-10-CM | POA: Diagnosis not present

## 2020-03-27 DIAGNOSIS — Z510 Encounter for antineoplastic radiation therapy: Secondary | ICD-10-CM | POA: Diagnosis not present

## 2020-03-30 DIAGNOSIS — E785 Hyperlipidemia, unspecified: Secondary | ICD-10-CM | POA: Diagnosis not present

## 2020-03-30 DIAGNOSIS — F1099 Alcohol use, unspecified with unspecified alcohol-induced disorder: Secondary | ICD-10-CM | POA: Diagnosis not present

## 2020-03-30 DIAGNOSIS — D649 Anemia, unspecified: Secondary | ICD-10-CM | POA: Diagnosis not present

## 2020-03-30 DIAGNOSIS — S82851E Displaced trimalleolar fracture of right lower leg, subsequent encounter for open fracture type I or II with routine healing: Secondary | ICD-10-CM | POA: Diagnosis not present

## 2020-03-30 DIAGNOSIS — Z853 Personal history of malignant neoplasm of breast: Secondary | ICD-10-CM | POA: Diagnosis not present

## 2020-03-30 DIAGNOSIS — E039 Hypothyroidism, unspecified: Secondary | ICD-10-CM | POA: Diagnosis not present

## 2020-03-30 DIAGNOSIS — M8580 Other specified disorders of bone density and structure, unspecified site: Secondary | ICD-10-CM | POA: Diagnosis not present

## 2020-03-30 DIAGNOSIS — E669 Obesity, unspecified: Secondary | ICD-10-CM | POA: Diagnosis not present

## 2020-03-30 DIAGNOSIS — Z87891 Personal history of nicotine dependence: Secondary | ICD-10-CM | POA: Diagnosis not present

## 2020-04-02 ENCOUNTER — Other Ambulatory Visit: Payer: Self-pay

## 2020-04-02 DIAGNOSIS — C73 Malignant neoplasm of thyroid gland: Secondary | ICD-10-CM

## 2020-04-02 DIAGNOSIS — S82851E Displaced trimalleolar fracture of right lower leg, subsequent encounter for open fracture type I or II with routine healing: Secondary | ICD-10-CM | POA: Diagnosis not present

## 2020-04-02 DIAGNOSIS — Z8781 Personal history of (healed) traumatic fracture: Secondary | ICD-10-CM | POA: Diagnosis not present

## 2020-04-02 DIAGNOSIS — M8588 Other specified disorders of bone density and structure, other site: Secondary | ICD-10-CM | POA: Diagnosis not present

## 2020-04-02 DIAGNOSIS — Z9889 Other specified postprocedural states: Secondary | ICD-10-CM | POA: Diagnosis not present

## 2020-04-02 NOTE — Progress Notes (Signed)
[facility]  [address] [place], [place] [address] Phone: [phone]  Fax: [phone]   Clinic Day:  04/06/2020  Referring physician: [name], MD  Chief Complaint: Tanya Frey is a 75 y.o. female with stage III papillary carcinoma thyroid and stage IA right breast cancer who is seen for assessment after radiation to initiate endocrine therapy.  HPI:  The patient was last seen in the medical oncology clinic on 02/10/2020. At that time, she felt good. Exam revealed a well healed right breast s/p lumpectomy. Oncotype DX testing revealed a recurrence score of 16 which translated to a risk of distant recurrence at 9 years of 15% (95% CI 10-19%) and no benefit of chemotherapy.  The patient received radiation to the right breast from 02/25/2020 - 03/27/2020 with Dr. Baruch Gouty. She received 42.56 Gy in 16 fractions. She received a Boost of 10 Gy in 5 fractions.  The patient underwent open reduction internal fixation of the right ankle on 03/16/2020 by Dr. Roland Rack. She had slipped on ice while trying to get the morning paper.  Bone density on 04/02/2020 revealed osteopenia with a T score of -1.5 at the lumbar spine.  During the interim, she has been "ok". The patient tolerated radiation well. The patient is going to be in a boot for 3.5 weeks. She is currently non-weight-bearing.  The patient does not drink fluids with electrolytes.  She has not been to the dentist in over a year.   Past Medical History:  Diagnosis Date  . Anemia   . Difficult intubation   . Diffuse cystic mastopathy   . Family history of malignant neoplasm of breast   . Hyperlipidemia   . Obesity, unspecified   . Personal history of tobacco use, presenting hazards to health   . Thyroid cancer (HCC)    T4a, NX: 4.6 cm lesion with extrathyroidal extension. Received I 131 post procedure.    . Varicose veins     Past Surgical History:  Procedure Laterality Date  .  BREAST LUMPECTOMY,RADIO FREQ LOCALIZER,AXILLARY SENTINEL LYMPH NODE BIOPSY Right 01/14/2020   Procedure: BREAST LUMPECTOMY,RADIO FREQ LOCALIZER,AXILLARY SENTINEL LYMPH NODE BIOPSY;  Surgeon: Jules Husbands, MD;  Location: [facility] ORS;  Service: General;  Laterality: Right;  . BREAST SURGERY    . COLONOSCOPY  2011   Dr. [name]; colon polyps (tubular adenoma)  . COLONOSCOPY N/A   . COLONOSCOPY WITH PROPOFOL N/A 10/10/2014   Procedure: COLONOSCOPY WITH PROPOFOL;  Surgeon: [name], MD;  Location: [facility] ENDOSCOPY;  Service: Endoscopy;  Laterality: N/A;  . ORIF ANKLE FRACTURE Right 03/16/2020   Procedure: OPEN REDUCTION INTERNAL FIXATION (ORIF) ANKLE FRACTURE;  Surgeon: [name], MD;  Location: [facility] ORS;  Service: Orthopedics;  Laterality: Right;  . THYROID SURGERY Bilateral   . THYROIDECTOMY N/A 02/21/2017   Procedure: THYROIDECTOMY;  Surgeon: [name], MD;  Location: [facility] ORS;  Service: ENT;  Laterality: N/A;  . TUBAL LIGATION    . VARICOSE VEIN SURGERY    . vein closure procedure Right 2009    Family History  Problem Relation Age of Onset  . Breast cancer Daughter 64       [name] BRCA negative    Social History:  reports that she quit smoking about 30 years ago. She has a 10.00 pack-year smoking history. She has never used smokeless tobacco. She reports current alcohol use of about 1.0 - 4.0 standard drink of alcohol per week. She reports that she does not  use drugs.  She is retired from [employer]. She lives in [place].  The patient is accompanied by her husband today.  Allergies:  Allergies  Allergen Reactions  . Cefdinir Rash    Pt tolerated exposure to cefazolin  . Protonix [Pantoprazole Sodium] Rash    Current Medications: Current Outpatient Medications  Medication Sig Dispense Refill  . aspirin 81 MG chewable tablet Chew by mouth daily.    . Calcium-Magnesium-Vitamin D (CALCIUM 1200+D3 PO) Take 1,200 mg by mouth daily.     . cyanocobalamin 100  MCG tablet Take 100 mcg by mouth daily.    Marland Kitchen HYDROcodone-acetaminophen (NORCO/VICODIN) 5-325 MG tablet Take 1-2 tablets by mouth every 4 (four) hours as needed for moderate pain. 60 tablet 0  . levothyroxine (SYNTHROID) 112 MCG tablet TAKE 1 TABLET(112 MCG) BY MOUTH DAILY BEFORE AND BREAKFAST 30 tablet 2  . Multiple Vitamins-Minerals (MULTIVITAMIN WITH MINERALS) tablet Take 1 tablet by mouth daily.    . simvastatin (ZOCOR) 40 MG tablet Take 40 mg by mouth every evening.     . methocarbamol (ROBAXIN) 500 MG tablet Take 1 tablet (500 mg total) by mouth every 8 (eight) hours as needed for muscle spasms. (Patient not taking: Reported on 04/06/2020) 60 tablet 0  . traMADol (ULTRAM) 50 MG tablet Take 1 tablet (50 mg total) by mouth every 12 (twelve) hours as needed for moderate pain. (Patient not taking: Reported on 04/06/2020) 15 tablet 0   No current facility-administered medications for this visit.    Review of Systems  Constitutional: Negative for chills, diaphoresis, fever, malaise/fatigue and weight loss (stable).  HENT: Negative.  Negative for congestion, ear discharge, ear pain, hearing loss, nosebleeds, sinus pain, sore throat and tinnitus.   Eyes: Negative.  Negative for blurred vision and double vision.  Respiratory: Negative.  Negative for cough, hemoptysis, sputum production and shortness of breath.   Cardiovascular: Negative.  Negative for chest pain, palpitations and leg swelling.  Gastrointestinal: Negative.  Negative for abdominal pain, blood in stool, constipation, diarrhea, heartburn, melena, nausea and vomiting.  Genitourinary: Negative.  Negative for dysuria, frequency, hematuria and urgency.  Musculoskeletal: Negative for back pain, joint pain, myalgias and neck pain.       Boot on right ankle.  Skin: Negative.  Negative for itching and rash.  Neurological: Negative.  Negative for dizziness, tingling, sensory change, weakness and headaches.  Endo/Heme/Allergies: Negative.  Does  not bruise/bleed easily.  Psychiatric/Behavioral: Negative.  Negative for depression and memory loss. The patient is not nervous/anxious and does not have insomnia.   All other systems reviewed and are negative.   Performance status (ECOG):  2  Vitals: Blood pressure (!) 145/72, pulse 71, temperature 98.4 F (36.9 C), temperature source Oral.   Physical Exam Vitals and nursing note reviewed.  Constitutional:      General: She is not in acute distress.    Appearance: She is well-developed. She is not diaphoretic.     Comments: Patient sitting comfortably in wheelchair with boot on right foot.  HENT:     Head: Normocephalic and atraumatic.     Comments: Styled dark hair with slight graying.    Mouth/Throat:     Mouth: Mucous membranes are moist.     Pharynx: Oropharynx is clear. No oropharyngeal exudate.  Eyes:     General: No scleral icterus.    Extraocular Movements: Extraocular movements intact.     Conjunctiva/sclera: Conjunctivae normal.     Pupils: Pupils are equal, round, and reactive to light.  Neck:     Vascular: No JVD.  Cardiovascular:     Rate and Rhythm: Normal rate and regular rhythm.     Heart sounds: Normal heart sounds. No murmur heard.   Pulmonary:     Effort: Pulmonary effort is normal. No respiratory distress.     Breath sounds: Normal breath sounds. No wheezing or rales.  Chest:     Chest wall: No tenderness.  Breasts:     Right: Skin change (slight rednessat site of radiation boost. Desquamation underneath breast) present. No mass, tenderness or supraclavicular adenopathy.     Left: No swelling, bleeding, mass, skin change, tenderness or supraclavicular adenopathy.    Abdominal:     General: Bowel sounds are normal. There is no distension.     Palpations: Abdomen is soft. There is no mass.     Tenderness: There is no abdominal tenderness. There is no guarding or rebound.  Musculoskeletal:        General: No tenderness. Normal range of motion.      Cervical back: Normal range of motion and neck supple.  Lymphadenopathy:     Head:     Right side of head: No preauricular, posterior auricular or occipital adenopathy.     Left side of head: No preauricular, posterior auricular or occipital adenopathy.     Cervical: No cervical adenopathy.     Upper Body:     Right upper body: No supraclavicular adenopathy.     Left upper body: No supraclavicular adenopathy.     Lower Body: No right inguinal adenopathy. No left inguinal adenopathy.  Skin:    General: Skin is warm and dry.     Coloration: Skin is not pale.     Findings: No erythema or rash.  Neurological:     Mental Status: She is alert and oriented to person, place, and time.  Psychiatric:        Behavior: Behavior normal.        Thought Content: Thought content normal.        Judgment: Judgment normal.    Appointment on 04/06/2020  Component Date Value Ref Range Status  . Sodium 04/06/2020 129* 135 - 145 mmol/L Final  . Potassium 04/06/2020 3.8  3.5 - 5.1 mmol/L Final  . Chloride 04/06/2020 93* 98 - 111 mmol/L Final  . CO2 04/06/2020 24  22 - 32 mmol/L Final  . Glucose, Bld 04/06/2020 130* 70 - 99 mg/dL Final   Glucose reference range applies only to samples taken after fasting for at least 8 hours.  . BUN 04/06/2020 11  8 - 23 mg/dL Final  . Creatinine, Ser 04/06/2020 0.56  0.44 - 1.00 mg/dL Final  . Calcium 04/06/2020 9.3  8.9 - 10.3 mg/dL Final  . Total Protein 04/06/2020 6.7  6.5 - 8.1 g/dL Final  . Albumin 04/06/2020 3.7  3.5 - 5.0 g/dL Final  . AST 04/06/2020 17  15 - 41 U/L Final  . ALT 04/06/2020 11  0 - 44 U/L Final  . Alkaline Phosphatase 04/06/2020 53  38 - 126 U/L Final  . Total Bilirubin 04/06/2020 0.4  0.3 - 1.2 mg/dL Final  . GFR, Estimated 04/06/2020 >60  >60 mL/min Final   Comment: (NOTE) Calculated using the CKD-EPI Creatinine Equation (2021)   . Anion gap 04/06/2020 12  5 - 15 Final   Performed at [facility], [address]  [place], [place] [address]  . WBC 04/06/2020 5.1  4.0 - 10.5 K/uL Final  .  RBC 04/06/2020 4.34  3.87 - 5.11 MIL/uL Final  . Hemoglobin 04/06/2020 12.3  12.0 - 15.0 g/dL Final  . HCT 04/06/2020 36.4  36.0 - 46.0 % Final  . MCV 04/06/2020 83.9  80.0 - 100.0 fL Final  . MCH 04/06/2020 28.3  26.0 - 34.0 pg Final  . MCHC 04/06/2020 33.8  30.0 - 36.0 g/dL Final  . RDW 04/06/2020 14.3  11.5 - 15.5 % Final  . Platelets 04/06/2020 225  150 - 400 K/uL Final  . nRBC 04/06/2020 0.0  0.0 - 0.2 % Final  . Neutrophils Relative % 04/06/2020 51  % Final  . Neutro Abs 04/06/2020 2.6  1.7 - 7.7 K/uL Final  . Lymphocytes Relative 04/06/2020 37  % Final  . Lymphs Abs 04/06/2020 1.9  0.7 - 4.0 K/uL Final  . Monocytes Relative 04/06/2020 10  % Final  . Monocytes Absolute 04/06/2020 0.5  0.1 - 1.0 K/uL Final  . Eosinophils Relative 04/06/2020 1  % Final  . Eosinophils Absolute 04/06/2020 0.1  0.0 - 0.5 K/uL Final  . Basophils Relative 04/06/2020 1  % Final  . Basophils Absolute 04/06/2020 0.1  0.0 - 0.1 K/uL Final  . Immature Granulocytes 04/06/2020 0  % Final  . Abs Immature Granulocytes 04/06/2020 0.02  0.00 - 0.07 K/uL Final   Performed at [facility], [address] [place], [place] [address]     Assessment:  Tanya Frey is a 75 y.o. female with stage III papillary carcinoma thyroid carcinoma s/p thyroidectomy in 02/21/2017.  Pathology revealed a 4.6 cm papillary thyroid carcinoma with extrathyroidal extension.  There was angioinvasion but no lymphatic invasion.  Margins were negative.  Pathologic stage was pT4a Nx (stage III).  Soft tissue neck CT on 01/11/2017 revealed a 5 cm complex cystic and solid mass arising from the right lobe of the thyroid compatible with known carcinoma. There was no malignant adenopathy.  She received 130.6 mCi I-131 with Thyrogen stimulation on 04/26/2017.  Whole body I-131 scan on 05/04/2017 revealed uptake at thyroid bed consistent with thyroid remnant.  There  was no scintigraphic evidence of iodine-avid metastatic thyroid cancer.  Thyroid ultrasound on 08/06/2018 revealed no residual or recurrent tissue post thyroidectomy.  She has chronic swallowing issues post surgery.    She has stage IA right breast cancer s/p partial mastectomy on 01/14/2020.  Pathology revealed a 7 mm grade 1 invasive mammary carcinoma (NOS, ductal).  There was a separate focus of DCIS and intraductal papilloma.  DCIS margins were 3 mm; invasive carcinoma margins were 6 mm.  One sentinel lymph node was negative.  Tumor was ER+ (>90%), PR+ (> 90%), and HER-2 negative (score 0).  Pathologic stage was pT1bpN0.  CA27.29 was 16.3 on 01/07/2020.  Right diagnostic mammogram on 12/18/2019 revealed an irregular 6 mm mass in the RIGHT upper outer breast. Ultrasound-guided biopsy was recommended. There was no RIGHT axillary adenopathy.  Oncotype DX testing revealed a recurrence score of 16 which translated to a risk of distant recurrence at 9 years of 15% (95% CI 10-19%) and no benefit of chemotherapy.  She received radiation to the right breast from 02/25/2020 - 03/27/2020. She received 42.56 Gy in 16 fractions. She received a Boost of 10 Gy in 5 fractions.  Bone density on 07/05/2016 revealed osteopenia with a T-score of -1.5 in L1-L4 and -13 in the left femoral neck.  Bone density on 08/15/2018 revealed osteopenia in the left femur neck with a T-score of -1.1 in the  left femoral neck.  She continued calcium and vitamin D  Symptomatically, she has been "ok". She tolerated radiation well. She is currently non-weight-bearing in a boot following right ankle fracture. She has not been to the dentist in over a year.  Plan: 1.   Labs today: CBC with diff, CMP. 2.   Stage IA right breast cancer  She is s/p partial mastectomy on 01/14/2020.   Tumor was grade I and 7 mm.   Tumor is ER/PR+ and Her2/neu-.   Margins were negative.   Sentinel lymph node was negative.  Oncotype DX testing  revealed a recurrence score of 16 no benefit of chemotherapy.  She completed radiation on 03/27/2020.  Discuss consideration of endocrine therapy (tamoxifen versus aromatase inhibitor).   Potential side effects reviewed.   Patient's mobility is limited secondary to recent fracture and immobilizing boot.    Avoid tamoxifen secondary to risk of thrombosis.   Discuss issues with possible osteopenia associated with aromatase inhibitors.    Contact Dr Roland Rack regarding initiation of AI.   Discussed consideration of Prolia.    Information provided.    Patient will need to have dental clearance.  Continue surveillance.  3.   Stage III thyroid carcinoma  Patient is s/p thyroidectomy 02/2017.  Patient is s/p I-131.  Thyroid ultrasound on 08/06/2018 revealed no residual disease.  Labs on 11/28/2019 were unremarkable:   Thyroglobulin was <2.0, thyroglobulin antibody was <1.0.    Free T4 was 1.30 and TSH was 0.016.  She is on Synthroid 112 mcg a day. 4.   Osteopenia  Bone density on 08/15/2018 confirmed osteopenia.  She is on calcium and vitamin D.  Discuss consideration of Prolia with initiation of an aromatase inhibitor. 5.   Hyponatremia  Sodium 129.  Send chemistries to Dr Harrel Lemon. 6.   Preauth Prolia. 7.   MD to contact Dr Roland Rack and Dr Honor Junes. 8.   Patient to obtain dental clearance prior to Prolia. 9.   RTC in 1 month for MD assessment, labs (CBC with diff, CMP), +/- Prolia and initiation of Femara.  I discussed the assessment and treatment plan with the patient.  The patient was provided an opportunity to ask questions and all were answered.  The patient agreed with the plan and demonstrated an understanding of the instructions.  The patient was advised to call back if the symptoms worsen or if the condition fails to improve as anticipated.    I provided 19 minutes of face-to-face time during this this encounter and > 50% was spent counseling as documented under my assessment and  plan.  An additional 10+ minutes were spent reviewing her chart (Epic and Care Everywhere) including notes, labs, and imaging studies.    Lequita Asal, MD, PhD    04/06/2020, 3:05 PM   I, Mirian Mo Tufford, am acting as a Education administrator for Lequita Asal, MD.  I, Dupree Mike Gip, MD, have reviewed the above documentation for accuracy and completeness, and I agree with the above.

## 2020-04-03 DIAGNOSIS — S82851E Displaced trimalleolar fracture of right lower leg, subsequent encounter for open fracture type I or II with routine healing: Secondary | ICD-10-CM | POA: Diagnosis not present

## 2020-04-03 DIAGNOSIS — Z87891 Personal history of nicotine dependence: Secondary | ICD-10-CM | POA: Diagnosis not present

## 2020-04-03 DIAGNOSIS — D649 Anemia, unspecified: Secondary | ICD-10-CM | POA: Diagnosis not present

## 2020-04-03 DIAGNOSIS — E669 Obesity, unspecified: Secondary | ICD-10-CM | POA: Diagnosis not present

## 2020-04-03 DIAGNOSIS — Z853 Personal history of malignant neoplasm of breast: Secondary | ICD-10-CM | POA: Diagnosis not present

## 2020-04-03 DIAGNOSIS — F1099 Alcohol use, unspecified with unspecified alcohol-induced disorder: Secondary | ICD-10-CM | POA: Diagnosis not present

## 2020-04-03 DIAGNOSIS — E785 Hyperlipidemia, unspecified: Secondary | ICD-10-CM | POA: Diagnosis not present

## 2020-04-03 DIAGNOSIS — M8580 Other specified disorders of bone density and structure, unspecified site: Secondary | ICD-10-CM | POA: Diagnosis not present

## 2020-04-03 DIAGNOSIS — E039 Hypothyroidism, unspecified: Secondary | ICD-10-CM | POA: Diagnosis not present

## 2020-04-06 ENCOUNTER — Encounter: Payer: Self-pay | Admitting: Hematology and Oncology

## 2020-04-06 ENCOUNTER — Inpatient Hospital Stay: Payer: Medicare HMO | Attending: Hematology and Oncology

## 2020-04-06 ENCOUNTER — Telehealth: Payer: Self-pay

## 2020-04-06 ENCOUNTER — Other Ambulatory Visit: Payer: Self-pay

## 2020-04-06 ENCOUNTER — Inpatient Hospital Stay: Payer: Medicare HMO | Admitting: Hematology and Oncology

## 2020-04-06 VITALS — BP 145/72 | HR 71 | Temp 98.4°F

## 2020-04-06 DIAGNOSIS — Z170 Estrogen receptor positive status [ER+]: Secondary | ICD-10-CM | POA: Diagnosis not present

## 2020-04-06 DIAGNOSIS — C50411 Malignant neoplasm of upper-outer quadrant of right female breast: Secondary | ICD-10-CM | POA: Diagnosis not present

## 2020-04-06 DIAGNOSIS — M8580 Other specified disorders of bone density and structure, unspecified site: Secondary | ICD-10-CM | POA: Insufficient documentation

## 2020-04-06 DIAGNOSIS — C73 Malignant neoplasm of thyroid gland: Secondary | ICD-10-CM | POA: Diagnosis not present

## 2020-04-06 DIAGNOSIS — M8588 Other specified disorders of bone density and structure, other site: Secondary | ICD-10-CM | POA: Diagnosis not present

## 2020-04-06 LAB — CBC WITH DIFFERENTIAL/PLATELET
Abs Immature Granulocytes: 0.02 10*3/uL (ref 0.00–0.07)
Basophils Absolute: 0.1 10*3/uL (ref 0.0–0.1)
Basophils Relative: 1 %
Eosinophils Absolute: 0.1 10*3/uL (ref 0.0–0.5)
Eosinophils Relative: 1 %
HCT: 36.4 % (ref 36.0–46.0)
Hemoglobin: 12.3 g/dL (ref 12.0–15.0)
Immature Granulocytes: 0 %
Lymphocytes Relative: 37 %
Lymphs Abs: 1.9 10*3/uL (ref 0.7–4.0)
MCH: 28.3 pg (ref 26.0–34.0)
MCHC: 33.8 g/dL (ref 30.0–36.0)
MCV: 83.9 fL (ref 80.0–100.0)
Monocytes Absolute: 0.5 10*3/uL (ref 0.1–1.0)
Monocytes Relative: 10 %
Neutro Abs: 2.6 10*3/uL (ref 1.7–7.7)
Neutrophils Relative %: 51 %
Platelets: 225 10*3/uL (ref 150–400)
RBC: 4.34 MIL/uL (ref 3.87–5.11)
RDW: 14.3 % (ref 11.5–15.5)
WBC: 5.1 10*3/uL (ref 4.0–10.5)
nRBC: 0 % (ref 0.0–0.2)

## 2020-04-06 LAB — COMPREHENSIVE METABOLIC PANEL
ALT: 11 U/L (ref 0–44)
AST: 17 U/L (ref 15–41)
Albumin: 3.7 g/dL (ref 3.5–5.0)
Alkaline Phosphatase: 53 U/L (ref 38–126)
Anion gap: 12 (ref 5–15)
BUN: 11 mg/dL (ref 8–23)
CO2: 24 mmol/L (ref 22–32)
Calcium: 9.3 mg/dL (ref 8.9–10.3)
Chloride: 93 mmol/L — ABNORMAL LOW (ref 98–111)
Creatinine, Ser: 0.56 mg/dL (ref 0.44–1.00)
GFR, Estimated: >60 mL/min (ref >60)
Glucose, Bld: 130 mg/dL — ABNORMAL HIGH (ref 70–99)
Potassium: 3.8 mmol/L (ref 3.5–5.1)
Sodium: 129 mmol/L — ABNORMAL LOW (ref 135–145)
Total Bilirubin: 0.4 mg/dL (ref 0.3–1.2)
Total Protein: 6.7 g/dL (ref 6.5–8.1)

## 2020-04-06 NOTE — Patient Instructions (Addendum)
Needs dental clearance prior to initiation Prolia   Letrozole tablets What is this medicine? LETROZOLE (LET roe zole) blocks the production of estrogen. It is used to treat breast cancer. This medicine may be used for other purposes; ask your health care provider or pharmacist if you have questions. COMMON BRAND NAME(S): Femara What should I tell my health care provider before I take this medicine? They need to know if you have any of these conditions:  high cholesterol  liver disease  osteoporosis (weak bones)  an unusual or allergic reaction to letrozole, other medicines, foods, dyes, or preservatives  pregnant or trying to get pregnant  breast-feeding How should I use this medicine? Take this medicine by mouth with a glass of water. You may take it with or without food. Follow the directions on the prescription label. Take your medicine at regular intervals. Do not take your medicine more often than directed. Do not stop taking except on your doctor's advice. Talk to your pediatrician regarding the use of this medicine in children. Special care may be needed. Overdosage: If you think you have taken too much of this medicine contact a poison control center or emergency room at once. NOTE: This medicine is only for you. Do not share this medicine with others. What if I miss a dose? If you miss a dose, take it as soon as you can. If it is almost time for your next dose, take only that dose. Do not take double or extra doses. What may interact with this medicine? Do not take this medicine with any of the following medications:  estrogens, like hormone replacement therapy or birth control pills This medicine may also interact with the following medications:  dietary supplements such as androstenedione or DHEA  prasterone  tamoxifen This list may not describe all possible interactions. Give your health care provider a list of all the medicines, herbs, non-prescription drugs,  or dietary supplements you use. Also tell them if you smoke, drink alcohol, or use illegal drugs. Some items may interact with your medicine. What should I watch for while using this medicine? Tell your doctor or healthcare professional if your symptoms do not start to get better or if they get worse. Do not become pregnant while taking this medicine or for 3 weeks after stopping it. Women should inform their doctor if they wish to become pregnant or think they might be pregnant. There is a potential for serious side effects to an unborn child. Talk to your health care professional or pharmacist for more information. Do not breast-feed while taking this medicine or for 3 weeks after stopping it. This medicine may interfere with the ability to have a child. Talk with your doctor or health care professional if you are concerned about your fertility. Using this medicine for a long time may increase your risk of low bone mass. Talk to your doctor about bone health. You may get drowsy or dizzy. Do not drive, use machinery, or do anything that needs mental alertness until you know how this medicine affects you. Do not stand or sit up quickly, especially if you are an older patient. This reduces the risk of dizzy or fainting spells. You may need blood work done while you are taking this medicine. What side effects may I notice from receiving this medicine? Side effects that you should report to your doctor or health care professional as soon as possible:  allergic reactions like skin rash, itching, or hives  bone fracture  chest pain  signs and symptoms of a blood clot such as breathing problems; changes in vision; chest pain; severe, sudden headache; pain, swelling, warmth in the leg; trouble speaking; sudden numbness or weakness of the face, arm or leg  vaginal bleeding Side effects that usually do not require medical attention (report to your doctor or health care professional if they continue or are  bothersome):  bone, back, joint, or muscle pain  dizziness  fatigue  fluid retention  headache  hot flashes, night sweats  nausea  weight gain This list may not describe all possible side effects. Call your doctor for medical advice about side effects. You may report side effects to FDA at 1-800-FDA-1088. Where should I keep my medicine? Keep out of the reach of children. Store between 15 and 30 degrees C (59 and 86 degrees F). Throw away any unused medicine after the expiration date. NOTE: This sheet is a summary. It may not cover all possible information. If you have questions about this medicine, talk to your doctor, pharmacist, or health care provider.  2021 Elsevier/Gold Standard (2015-08-31 11:10:41)   Denosumab injection What is this medicine? DENOSUMAB (den oh sue mab) slows bone breakdown. Prolia is used to treat osteoporosis in women after menopause and in men, and in people who are taking corticosteroids for 6 months or more. Delton See is used to treat a high calcium level due to cancer and to prevent bone fractures and other bone problems caused by multiple myeloma or cancer bone metastases. Delton See is also used to treat giant cell tumor of the bone. This medicine may be used for other purposes; ask your health care provider or pharmacist if you have questions. COMMON BRAND NAME(S): Prolia, XGEVA What should I tell my health care provider before I take this medicine? They need to know if you have any of these conditions:  dental disease  having surgery or tooth extraction  infection  kidney disease  low levels of calcium or Vitamin D in the blood  malnutrition  on hemodialysis  skin conditions or sensitivity  thyroid or parathyroid disease  an unusual reaction to denosumab, other medicines, foods, dyes, or preservatives  pregnant or trying to get pregnant  breast-feeding How should I use this medicine? This medicine is for injection under the skin. It  is given by a health care professional in a hospital or clinic setting. A special MedGuide will be given to you before each treatment. Be sure to read this information carefully each time. For Prolia, talk to your pediatrician regarding the use of this medicine in children. Special care may be needed. For Delton See, talk to your pediatrician regarding the use of this medicine in children. While this drug may be prescribed for children as young as 13 years for selected conditions, precautions do apply. Overdosage: If you think you have taken too much of this medicine contact a poison control center or emergency room at once. NOTE: This medicine is only for you. Do not share this medicine with others. What if I miss a dose? It is important not to miss your dose. Call your doctor or health care professional if you are unable to keep an appointment. What may interact with this medicine? Do not take this medicine with any of the following medications:  other medicines containing denosumab This medicine may also interact with the following medications:  medicines that lower your chance of fighting infection  steroid medicines like prednisone or cortisone This list may not describe all possible  interactions. Give your health care provider a list of all the medicines, herbs, non-prescription drugs, or dietary supplements you use. Also tell them if you smoke, drink alcohol, or use illegal drugs. Some items may interact with your medicine. What should I watch for while using this medicine? Visit your doctor or health care professional for regular checks on your progress. Your doctor or health care professional may order blood tests and other tests to see how you are doing. Call your doctor or health care professional for advice if you get a fever, chills or sore throat, or other symptoms of a cold or flu. Do not treat yourself. This drug may decrease your body's ability to fight infection. Try to avoid being  around people who are sick. You should make sure you get enough calcium and vitamin D while you are taking this medicine, unless your doctor tells you not to. Discuss the foods you eat and the vitamins you take with your health care professional. See your dentist regularly. Brush and floss your teeth as directed. Before you have any dental work done, tell your dentist you are receiving this medicine. Do not become pregnant while taking this medicine or for 5 months after stopping it. Talk with your doctor or health care professional about your birth control options while taking this medicine. Women should inform their doctor if they wish to become pregnant or think they might be pregnant. There is a potential for serious side effects to an unborn child. Talk to your health care professional or pharmacist for more information. What side effects may I notice from receiving this medicine? Side effects that you should report to your doctor or health care professional as soon as possible:  allergic reactions like skin rash, itching or hives, swelling of the face, lips, or tongue  bone pain  breathing problems  dizziness  jaw pain, especially after dental work  redness, blistering, peeling of the skin  signs and symptoms of infection like fever or chills; cough; sore throat; pain or trouble passing urine  signs of low calcium like fast heartbeat, muscle cramps or muscle pain; pain, tingling, numbness in the hands or feet; seizures  unusual bleeding or bruising  unusually weak or tired Side effects that usually do not require medical attention (report to your doctor or health care professional if they continue or are bothersome):  constipation  diarrhea  headache  joint pain  loss of appetite  muscle pain  runny nose  tiredness  upset stomach This list may not describe all possible side effects. Call your doctor for medical advice about side effects. You may report side effects  to FDA at 1-800-FDA-1088. Where should I keep my medicine? This medicine is only given in a clinic, doctor's office, or other health care setting and will not be stored at home. NOTE: This sheet is a summary. It may not cover all possible information. If you have questions about this medicine, talk to your doctor, pharmacist, or health care provider.  2021 Elsevier/Gold Standard (2017-06-02 16:10:44)

## 2020-04-06 NOTE — Progress Notes (Signed)
Patient here today to follow up after completing radiation for breast cancer. Patient had recent fall on the ice and fractured her ankle, had surgery and is now in a boot.

## 2020-04-07 ENCOUNTER — Telehealth: Payer: Self-pay

## 2020-04-07 ENCOUNTER — Telehealth: Payer: Self-pay | Admitting: *Deleted

## 2020-04-07 DIAGNOSIS — Z853 Personal history of malignant neoplasm of breast: Secondary | ICD-10-CM | POA: Diagnosis not present

## 2020-04-07 DIAGNOSIS — Z87891 Personal history of nicotine dependence: Secondary | ICD-10-CM | POA: Diagnosis not present

## 2020-04-07 DIAGNOSIS — M8580 Other specified disorders of bone density and structure, unspecified site: Secondary | ICD-10-CM | POA: Diagnosis not present

## 2020-04-07 DIAGNOSIS — D649 Anemia, unspecified: Secondary | ICD-10-CM | POA: Diagnosis not present

## 2020-04-07 DIAGNOSIS — E785 Hyperlipidemia, unspecified: Secondary | ICD-10-CM | POA: Diagnosis not present

## 2020-04-07 DIAGNOSIS — F1099 Alcohol use, unspecified with unspecified alcohol-induced disorder: Secondary | ICD-10-CM | POA: Diagnosis not present

## 2020-04-07 DIAGNOSIS — E039 Hypothyroidism, unspecified: Secondary | ICD-10-CM | POA: Diagnosis not present

## 2020-04-07 DIAGNOSIS — S82851E Displaced trimalleolar fracture of right lower leg, subsequent encounter for open fracture type I or II with routine healing: Secondary | ICD-10-CM | POA: Diagnosis not present

## 2020-04-07 DIAGNOSIS — E669 Obesity, unspecified: Secondary | ICD-10-CM | POA: Diagnosis not present

## 2020-04-07 NOTE — Telephone Encounter (Addendum)
Patient husband told [facility] that Dr Mike Gip wants clearance from them for her treatment. They are requesting something in writing from our office as to exactly what we need faxed to them [phone]

## 2020-04-07 NOTE — Telephone Encounter (Signed)
  Please reach out to the dentist or send them a FAX about clearance for Prolia.  M

## 2020-04-09 DIAGNOSIS — D649 Anemia, unspecified: Secondary | ICD-10-CM | POA: Diagnosis not present

## 2020-04-09 DIAGNOSIS — E669 Obesity, unspecified: Secondary | ICD-10-CM | POA: Diagnosis not present

## 2020-04-09 DIAGNOSIS — Z87891 Personal history of nicotine dependence: Secondary | ICD-10-CM | POA: Diagnosis not present

## 2020-04-09 DIAGNOSIS — F1099 Alcohol use, unspecified with unspecified alcohol-induced disorder: Secondary | ICD-10-CM | POA: Diagnosis not present

## 2020-04-09 DIAGNOSIS — S82851E Displaced trimalleolar fracture of right lower leg, subsequent encounter for open fracture type I or II with routine healing: Secondary | ICD-10-CM | POA: Diagnosis not present

## 2020-04-09 DIAGNOSIS — Z853 Personal history of malignant neoplasm of breast: Secondary | ICD-10-CM | POA: Diagnosis not present

## 2020-04-09 DIAGNOSIS — E785 Hyperlipidemia, unspecified: Secondary | ICD-10-CM | POA: Diagnosis not present

## 2020-04-09 DIAGNOSIS — E039 Hypothyroidism, unspecified: Secondary | ICD-10-CM | POA: Diagnosis not present

## 2020-04-09 DIAGNOSIS — M8580 Other specified disorders of bone density and structure, unspecified site: Secondary | ICD-10-CM | POA: Diagnosis not present

## 2020-04-13 DIAGNOSIS — E785 Hyperlipidemia, unspecified: Secondary | ICD-10-CM | POA: Diagnosis not present

## 2020-04-13 DIAGNOSIS — D649 Anemia, unspecified: Secondary | ICD-10-CM | POA: Diagnosis not present

## 2020-04-13 DIAGNOSIS — E039 Hypothyroidism, unspecified: Secondary | ICD-10-CM | POA: Diagnosis not present

## 2020-04-13 DIAGNOSIS — F1099 Alcohol use, unspecified with unspecified alcohol-induced disorder: Secondary | ICD-10-CM | POA: Diagnosis not present

## 2020-04-13 DIAGNOSIS — M8580 Other specified disorders of bone density and structure, unspecified site: Secondary | ICD-10-CM | POA: Diagnosis not present

## 2020-04-13 DIAGNOSIS — S82851E Displaced trimalleolar fracture of right lower leg, subsequent encounter for open fracture type I or II with routine healing: Secondary | ICD-10-CM | POA: Diagnosis not present

## 2020-04-13 DIAGNOSIS — Z87891 Personal history of nicotine dependence: Secondary | ICD-10-CM | POA: Diagnosis not present

## 2020-04-13 DIAGNOSIS — Z853 Personal history of malignant neoplasm of breast: Secondary | ICD-10-CM | POA: Diagnosis not present

## 2020-04-13 DIAGNOSIS — E669 Obesity, unspecified: Secondary | ICD-10-CM | POA: Diagnosis not present

## 2020-04-14 DIAGNOSIS — S82891B Other fracture of right lower leg, initial encounter for open fracture type I or II: Secondary | ICD-10-CM | POA: Diagnosis not present

## 2020-04-14 DIAGNOSIS — R531 Weakness: Secondary | ICD-10-CM | POA: Diagnosis not present

## 2020-04-20 DIAGNOSIS — F1099 Alcohol use, unspecified with unspecified alcohol-induced disorder: Secondary | ICD-10-CM | POA: Diagnosis not present

## 2020-04-20 DIAGNOSIS — E785 Hyperlipidemia, unspecified: Secondary | ICD-10-CM | POA: Diagnosis not present

## 2020-04-20 DIAGNOSIS — Z87891 Personal history of nicotine dependence: Secondary | ICD-10-CM | POA: Diagnosis not present

## 2020-04-20 DIAGNOSIS — S82851E Displaced trimalleolar fracture of right lower leg, subsequent encounter for open fracture type I or II with routine healing: Secondary | ICD-10-CM | POA: Diagnosis not present

## 2020-04-20 DIAGNOSIS — M8580 Other specified disorders of bone density and structure, unspecified site: Secondary | ICD-10-CM | POA: Diagnosis not present

## 2020-04-20 DIAGNOSIS — E669 Obesity, unspecified: Secondary | ICD-10-CM | POA: Diagnosis not present

## 2020-04-20 DIAGNOSIS — E039 Hypothyroidism, unspecified: Secondary | ICD-10-CM | POA: Diagnosis not present

## 2020-04-20 DIAGNOSIS — Z853 Personal history of malignant neoplasm of breast: Secondary | ICD-10-CM | POA: Diagnosis not present

## 2020-04-20 DIAGNOSIS — D649 Anemia, unspecified: Secondary | ICD-10-CM | POA: Diagnosis not present

## 2020-04-22 ENCOUNTER — Ambulatory Visit (INDEPENDENT_AMBULATORY_CARE_PROVIDER_SITE_OTHER): Payer: Medicare HMO | Admitting: Surgery

## 2020-04-22 ENCOUNTER — Other Ambulatory Visit: Payer: Self-pay

## 2020-04-22 ENCOUNTER — Encounter: Payer: Self-pay | Admitting: Surgery

## 2020-04-22 VITALS — BP 123/72 | HR 85 | Temp 98.3°F | Ht 65.0 in | Wt 158.0 lb

## 2020-04-22 DIAGNOSIS — C50411 Malignant neoplasm of upper-outer quadrant of right female breast: Secondary | ICD-10-CM

## 2020-04-22 DIAGNOSIS — Z170 Estrogen receptor positive status [ER+]: Secondary | ICD-10-CM | POA: Diagnosis not present

## 2020-04-22 NOTE — Patient Instructions (Addendum)
The discoloration should get better in time. Please call and ask to speak with a nurse if you develop questions or concerns.   The patient has been asked to return to the office in October with a bilateral diagnostic mammogram. We will send you a letter about these appointments.

## 2020-04-22 NOTE — Progress Notes (Signed)
Outpatient Surgical Follow Up  04/22/2020  Tanya Frey is an 75 y.o. female.   Chief Complaint  Patient presents with  . Follow-up    HPI: This is a 75 year old female right breast outer upper quadrant cancer ER/PR positive HER-2 negative.  underwent uneventful lumpectomy with negative margins.  Negative nodes.  Recently finished radiation therapy with some skin radiation changes on the right breast.  She feels that there is also discoloration on her right breast.  She also had a recent fall and had to have ORIF by Dr. Jacqualine Code.  She is still in a boot but is recovering well.  She uses a walker since there is no weightbearing on that right lower extremity. Regarding her breast she has no complaints other just mild soreness after radiation therapy and discoloration of the right breast.  No discharge no fevers no chills no weight loss.  Please note that I personally reviewed operative report and recent hospitalization.  Past Medical History:  Diagnosis Date  . Anemia   . Breast cancer in female [facility]) 12/27/2019   Right breast  . Difficult intubation   . Diffuse cystic mastopathy   . Family history of malignant neoplasm of breast   . Hyperlipidemia   . Obesity, unspecified   . Personal history of tobacco use, presenting hazards to health   . Thyroid cancer (HCC)    T4a, NX: 4.6 cm lesion with extrathyroidal extension. Received I 131 post procedure.    . Varicose veins     Past Surgical History:  Procedure Laterality Date  . BREAST LUMPECTOMY,RADIO FREQ LOCALIZER,AXILLARY SENTINEL LYMPH NODE BIOPSY Right 01/14/2020   Procedure: BREAST LUMPECTOMY,RADIO FREQ LOCALIZER,AXILLARY SENTINEL LYMPH NODE BIOPSY;  Surgeon: Jules Husbands, MD;  Location: [facility] ORS;  Service: General;  Laterality: Right;  . BREAST SURGERY    . COLONOSCOPY  2011   Dr. [name]; colon polyps (tubular adenoma)  . COLONOSCOPY N/A   . COLONOSCOPY WITH PROPOFOL N/A 10/10/2014   Procedure: COLONOSCOPY WITH PROPOFOL;   Surgeon: [name], MD;  Location: [facility] ENDOSCOPY;  Service: Endoscopy;  Laterality: N/A;  . ORIF ANKLE FRACTURE Right 03/16/2020   Procedure: OPEN REDUCTION INTERNAL FIXATION (ORIF) ANKLE FRACTURE;  Surgeon: [name], MD;  Location: [facility] ORS;  Service: Orthopedics;  Laterality: Right;  . THYROID SURGERY Bilateral   . THYROIDECTOMY N/A 02/21/2017   Procedure: THYROIDECTOMY;  Surgeon: [name], MD;  Location: [facility] ORS;  Service: ENT;  Laterality: N/A;  . TUBAL LIGATION    . VARICOSE VEIN SURGERY    . vein closure procedure Right 2009    Family History  Problem Relation Age of Onset  . Breast cancer Daughter 89       [name] BRCA negative    Social History:  reports that she quit smoking about 30 years ago. She has a 10.00 pack-year smoking history. She has never used smokeless tobacco. She reports current alcohol use of about 1.0 - 4.0 standard drink of alcohol per week. She reports that she does not use drugs.  Allergies:  Allergies  Allergen Reactions  . Cefdinir Rash    Pt tolerated exposure to cefazolin  . Protonix [Pantoprazole Sodium] Rash    Medications reviewed.    ROS Full ROS performed and is otherwise negative other than what is stated in HPI   BP 123/72   Pulse 85   Temp 98.3 F (36.8 C)   Ht '5\' 5"'  (1.651 m)   Wt 158 lb (71.7 kg)  SpO2 97%   BMI 26.29 kg/m   Physical Exam Vitals and nursing note reviewed. Exam conducted with a chaperone present.  Constitutional:      General: She is not in acute distress.    Appearance: Normal appearance. She is normal weight.  Eyes:     General:        Right eye: No discharge.        Left eye: No discharge.  Pulmonary:     Effort: Pulmonary effort is normal. No respiratory distress.     Breath sounds: Normal breath sounds.     Comments: BREAST: Lumpectomy and sentinel lymph node biopsy scar on the right side.  Evidence of radiation changes to the right breast/there is no evidence of  infection there is no evidence of new masses seromas or complications.  Left breast completely normal Abdominal:     General: Abdomen is flat. There is no distension.     Palpations: Abdomen is soft. There is no mass.  Musculoskeletal:        General: No swelling or tenderness. Normal range of motion.     Cervical back: Normal range of motion and neck supple. No rigidity or tenderness.  Skin:    Capillary Refill: Capillary refill takes less than 2 seconds.  Neurological:     General: No focal deficit present.     Mental Status: She is alert and oriented to person, place, and time.  Psychiatric:        Mood and Affect: Mood normal.        Behavior: Behavior normal.        Thought Content: Thought content normal.        Judgment: Judgment normal.      Assessment/Plan:  76 year old female with breast cancer status post radiation and lumpectomy doing well.  I will see her back in her 1 year anniversary with mammogram and physical exam.  No concerning lesions.  Discussed with patient detail about radiation changes should subside with time but it will take several months for her to notice a difference.  No need for surgical intervention or further diagnostic testing at this time  Greater than 50% of the 25 minutes  visit was spent in counseling/coordination of care   Caroleen Hamman, MD [place]

## 2020-04-28 DIAGNOSIS — Z87891 Personal history of nicotine dependence: Secondary | ICD-10-CM | POA: Diagnosis not present

## 2020-04-28 DIAGNOSIS — Z853 Personal history of malignant neoplasm of breast: Secondary | ICD-10-CM | POA: Diagnosis not present

## 2020-04-28 DIAGNOSIS — E039 Hypothyroidism, unspecified: Secondary | ICD-10-CM | POA: Diagnosis not present

## 2020-04-28 DIAGNOSIS — E669 Obesity, unspecified: Secondary | ICD-10-CM | POA: Diagnosis not present

## 2020-04-28 DIAGNOSIS — M8580 Other specified disorders of bone density and structure, unspecified site: Secondary | ICD-10-CM | POA: Diagnosis not present

## 2020-04-28 DIAGNOSIS — E785 Hyperlipidemia, unspecified: Secondary | ICD-10-CM | POA: Diagnosis not present

## 2020-04-28 DIAGNOSIS — D649 Anemia, unspecified: Secondary | ICD-10-CM | POA: Diagnosis not present

## 2020-04-28 DIAGNOSIS — F1099 Alcohol use, unspecified with unspecified alcohol-induced disorder: Secondary | ICD-10-CM | POA: Diagnosis not present

## 2020-04-28 DIAGNOSIS — S82851E Displaced trimalleolar fracture of right lower leg, subsequent encounter for open fracture type I or II with routine healing: Secondary | ICD-10-CM | POA: Diagnosis not present

## 2020-04-30 ENCOUNTER — Other Ambulatory Visit: Payer: Self-pay

## 2020-04-30 ENCOUNTER — Encounter: Payer: Self-pay | Admitting: Radiation Oncology

## 2020-04-30 ENCOUNTER — Ambulatory Visit
Admission: RE | Admit: 2020-04-30 | Discharge: 2020-04-30 | Disposition: A | Discharge status: Home / Self Care | Payer: Medicare HMO | Source: Ambulatory Visit | Attending: Radiation Oncology | Admitting: Radiation Oncology

## 2020-04-30 DIAGNOSIS — C50411 Malignant neoplasm of upper-outer quadrant of right female breast: Secondary | ICD-10-CM | POA: Diagnosis not present

## 2020-04-30 DIAGNOSIS — Z170 Estrogen receptor positive status [ER+]: Secondary | ICD-10-CM | POA: Diagnosis not present

## 2020-04-30 DIAGNOSIS — Z923 Personal history of irradiation: Secondary | ICD-10-CM | POA: Diagnosis not present

## 2020-04-30 DIAGNOSIS — L818 Other specified disorders of pigmentation: Secondary | ICD-10-CM | POA: Insufficient documentation

## 2020-04-30 NOTE — Progress Notes (Signed)
Radiation Oncology Follow up Note  Name: Tanya Frey   Date:   04/30/2020 MRN:  [id number] DOB: Jun 29, 1945    This 75 y.o. female presents to the clinic today for 1 month follow-up status post whole breast radiation to her right breast for stage I ER/PR positive invasive mammary carcinoma.  REFERRING PROVIDER: [name], MD  HPI: Patient is a 75 year old female now 1 month out having completed whole breast radiation to her right breast for stage T1BN0 ER/PR positive HER-2 negative invasive mammary carcinoma.  Seen today in routine follow-up she is doing well.  She specifically denies breast tenderness cough or bone pain..  She is not yet started on antiestrogen therapy she is seen medical oncology in the near future for that.  COMPLICATIONS OF TREATMENT: none  FOLLOW UP COMPLIANCE: keeps appointments   PHYSICAL EXAM:  BP (P) 128/71 (BP Location: Left Arm, Patient Position: Sitting)   Pulse (P) 89   Temp (!) (P) 97.5 F (36.4 C)   Resp (P) 18  Lungs are clear to A&P cardiac examination essentially unremarkable with regular rate and rhythm. No dominant mass or nodularity is noted in either breast in 2 positions examined. Incision is well-healed. No axillary or supraclavicular adenopathy is appreciated. Cosmetic result is is good.  There is significant hyperpigmentation of the skin which I have is sure reassured the patient this will fade over the next several months.  Well-developed well-nourished patient in NAD. HEENT reveals PERLA, EOMI, discs not visualized.  Oral cavity is clear. No oral mucosal lesions are identified. Neck is clear without evidence of cervical or supraclavicular adenopathy. Lungs are clear to A&P. Cardiac examination is essentially unremarkable with regular rate and rhythm without murmur rub or thrill. Abdomen is benign with no organomegaly or masses noted. Motor sensory and DTR levels are equal and symmetric in the upper and lower extremities. Cranial nerves  II through XII are grossly intact. Proprioception is intact. No peripheral adenopathy or edema is identified. No motor or sensory levels are noted. Crude visual fields are within normal range.  RADIOLOGY RESULTS: No current films for review  PLAN: At the present time patient is doing well recovering nicely from her radiation therapy treatments.  I am pleased with her overall progress.  I have asked to see her out in 4 to 5 months for follow-up.  She will be seeing medical oncology for discussion of antiestrogen therapy.  Patient knows to call with any concerns.  I would like to take this opportunity to thank you for allowing me to participate in the care of your patient.Noreene Filbert, MD

## 2020-05-01 DIAGNOSIS — E785 Hyperlipidemia, unspecified: Secondary | ICD-10-CM | POA: Diagnosis not present

## 2020-05-01 DIAGNOSIS — E039 Hypothyroidism, unspecified: Secondary | ICD-10-CM | POA: Diagnosis not present

## 2020-05-01 DIAGNOSIS — E669 Obesity, unspecified: Secondary | ICD-10-CM | POA: Diagnosis not present

## 2020-05-01 DIAGNOSIS — D649 Anemia, unspecified: Secondary | ICD-10-CM | POA: Diagnosis not present

## 2020-05-01 DIAGNOSIS — M8580 Other specified disorders of bone density and structure, unspecified site: Secondary | ICD-10-CM | POA: Diagnosis not present

## 2020-05-01 DIAGNOSIS — F1099 Alcohol use, unspecified with unspecified alcohol-induced disorder: Secondary | ICD-10-CM | POA: Diagnosis not present

## 2020-05-01 DIAGNOSIS — Z853 Personal history of malignant neoplasm of breast: Secondary | ICD-10-CM | POA: Diagnosis not present

## 2020-05-01 DIAGNOSIS — Z87891 Personal history of nicotine dependence: Secondary | ICD-10-CM | POA: Diagnosis not present

## 2020-05-01 DIAGNOSIS — S82851E Displaced trimalleolar fracture of right lower leg, subsequent encounter for open fracture type I or II with routine healing: Secondary | ICD-10-CM | POA: Diagnosis not present

## 2020-05-05 NOTE — Progress Notes (Signed)
[facility]  [address], [address] [place], [place] [address] Phone: [phone]  Fax: [phone]   Clinic Day:  05/06/2020  Referring physician: [name], MD  Chief Complaint: Tanya Frey is a 75 y.o. female with stage III papillary carcinoma thyroid and stage IA right breast cancer who is seen for 1 month assessment and initiation of Femara.  HPI:  The patient was last seen in the medical oncology clinic on 04/06/2020. At that time, she was "ok". She tolerated radiation well. She was non-weight-bearing in a boot following right ankle fracture. She had not been to the dentist in over a year. Hematocrit was 36.4, hemoglobin 12.3, platelets 225,000, WBC 5,100.  Sodium was 129.  During the interim, she has been "alright." She is able to walk on her right foot now. She is ready to begin Femara.  She saw her dentist last week. Dental clearance for Prolia has been scanned into her chart. She vitamin D3 and calcium.   Past Medical History:  Diagnosis Date  . Anemia   . Breast cancer in female [facility]) 12/27/2019   Right breast  . Difficult intubation   . Diffuse cystic mastopathy   . Family history of malignant neoplasm of breast   . Hyperlipidemia   . Obesity, unspecified   . Personal history of tobacco use, presenting hazards to health   . Thyroid cancer (HCC)    T4a, NX: 4.6 cm lesion with extrathyroidal extension. Received I 131 post procedure.    . Varicose veins     Past Surgical History:  Procedure Laterality Date  . BREAST LUMPECTOMY,RADIO FREQ LOCALIZER,AXILLARY SENTINEL LYMPH NODE BIOPSY Right 01/14/2020   Procedure: BREAST LUMPECTOMY,RADIO FREQ LOCALIZER,AXILLARY SENTINEL LYMPH NODE BIOPSY;  Surgeon: Jules Husbands, MD;  Location: [facility] ORS;  Service: General;  Laterality: Right;  . BREAST SURGERY    . COLONOSCOPY  2011   Dr. [name]; colon polyps (tubular adenoma)  . COLONOSCOPY N/A   . COLONOSCOPY WITH PROPOFOL N/A 10/10/2014    Procedure: COLONOSCOPY WITH PROPOFOL;  Surgeon: [name], MD;  Location: [facility] ENDOSCOPY;  Service: Endoscopy;  Laterality: N/A;  . ORIF ANKLE FRACTURE Right 03/16/2020   Procedure: OPEN REDUCTION INTERNAL FIXATION (ORIF) ANKLE FRACTURE;  Surgeon: [name], MD;  Location: [facility] ORS;  Service: Orthopedics;  Laterality: Right;  . THYROID SURGERY Bilateral   . THYROIDECTOMY N/A 02/21/2017   Procedure: THYROIDECTOMY;  Surgeon: [name], MD;  Location: [facility] ORS;  Service: ENT;  Laterality: N/A;  . TUBAL LIGATION    . VARICOSE VEIN SURGERY    . vein closure procedure Right 2009    Family History  Problem Relation Age of Onset  . Breast cancer Daughter 31       [name] BRCA negative    Social History:  reports that she quit smoking about 30 years ago. She has a 10.00 pack-year smoking history. She has never used smokeless tobacco. She reports current alcohol use of about 1.0 - 4.0 standard drink of alcohol per week. She reports that she does not use drugs.  She is retired from [employer]. She lives in [place].  The patient is accompanied by her husband today.  Allergies:  Allergies  Allergen Reactions  . Cefdinir Rash    Pt tolerated exposure to cefazolin  . Protonix [Pantoprazole Sodium] Rash    Current Medications: Current Outpatient Medications  Medication Sig Dispense Refill  . aspirin EC 81 MG tablet Take 81 mg by mouth  daily. Swallow whole.    . Calcium-Magnesium-Vitamin D (CALCIUM 1200+D3 PO) Take 1,200 mg by mouth daily.     . cyanocobalamin 100 MCG tablet Take 100 mcg by mouth daily.    Marland Kitchen levothyroxine (SYNTHROID) 112 MCG tablet TAKE 1 TABLET(112 MCG) BY MOUTH DAILY BEFORE AND BREAKFAST 30 tablet 2  . Multiple Vitamins-Minerals (MULTIVITAMIN WITH MINERALS) tablet Take 1 tablet by mouth daily.    . simvastatin (ZOCOR) 40 MG tablet Take 40 mg by mouth every evening.      No current facility-administered medications for this visit.    Review of  Systems  Constitutional: Negative for chills, diaphoresis, fever, malaise/fatigue and weight loss (stable).  HENT: Negative.  Negative for congestion, ear discharge, ear pain, hearing loss, nosebleeds, sinus pain, sore throat and tinnitus.   Eyes: Negative.  Negative for blurred vision and double vision.  Respiratory: Negative.  Negative for cough, hemoptysis, sputum production and shortness of breath.   Cardiovascular: Negative.  Negative for chest pain, palpitations and leg swelling.  Gastrointestinal: Negative.  Negative for abdominal pain, blood in stool, constipation, diarrhea, heartburn, melena, nausea and vomiting.  Genitourinary: Negative.  Negative for dysuria, frequency, hematuria and urgency.  Musculoskeletal: Negative for back pain, joint pain, myalgias and neck pain.       Boot on right ankle.  Skin: Negative.  Negative for itching and rash.  Neurological: Negative.  Negative for dizziness, tingling, sensory change, weakness and headaches.  Endo/Heme/Allergies: Negative.  Does not bruise/bleed easily.  Psychiatric/Behavioral: Negative.  Negative for depression and memory loss. The patient is not nervous/anxious and does not have insomnia.   All other systems reviewed and are negative.   Performance status (ECOG):  1  Vitals: Blood pressure 119/67, pulse 87, temperature (!) 97 F (36.1 C), temperature source Tympanic, resp. rate 18, weight 154 lb 5.2 oz (70 kg), SpO2 100 %.   Physical Exam Vitals and nursing note reviewed.  Constitutional:      General: She is not in acute distress.    Appearance: She is well-developed. She is not diaphoretic.  HENT:     Head: Normocephalic and atraumatic.     Comments: Styled dark hair with slight graying.    Mouth/Throat:     Mouth: Mucous membranes are moist.     Pharynx: Oropharynx is clear. No oropharyngeal exudate.  Eyes:     General: No scleral icterus.    Extraocular Movements: Extraocular movements intact.      Conjunctiva/sclera: Conjunctivae normal.     Pupils: Pupils are equal, round, and reactive to light.  Neck:     Vascular: No JVD.  Cardiovascular:     Rate and Rhythm: Normal rate and regular rhythm.     Heart sounds: Normal heart sounds. No murmur heard.   Pulmonary:     Effort: Pulmonary effort is normal. No respiratory distress.     Breath sounds: Normal breath sounds. No wheezing or rales.  Chest:     Chest wall: No tenderness.  Breasts:     Right: No supraclavicular adenopathy.     Left: No supraclavicular adenopathy.    Abdominal:     General: Bowel sounds are normal. There is no distension.     Palpations: Abdomen is soft. There is no mass.     Tenderness: There is no abdominal tenderness. There is no guarding or rebound.  Musculoskeletal:        General: No tenderness. Normal range of motion.     Cervical back: Normal range of  motion and neck supple.     Comments: Boot on right foot.  Lymphadenopathy:     Head:     Right side of head: No preauricular, posterior auricular or occipital adenopathy.     Left side of head: No preauricular, posterior auricular or occipital adenopathy.     Cervical: No cervical adenopathy.     Upper Body:     Right upper body: No supraclavicular adenopathy.     Left upper body: No supraclavicular adenopathy.     Lower Body: No right inguinal adenopathy. No left inguinal adenopathy.  Skin:    General: Skin is warm and dry.     Coloration: Skin is not pale.     Findings: No erythema or rash.  Neurological:     Mental Status: She is alert and oriented to person, place, and time.  Psychiatric:        Behavior: Behavior normal.        Thought Content: Thought content normal.        Judgment: Judgment normal.    Appointment on 05/06/2020  Component Date Value Ref Range Status  . Sodium 05/06/2020 134* 135 - 145 mmol/L Final  . Potassium 05/06/2020 4.0  3.5 - 5.1 mmol/L Final  . Chloride 05/06/2020 100  98 - 111 mmol/L Final  . CO2  05/06/2020 28  22 - 32 mmol/L Final  . Glucose, Bld 05/06/2020 114* 70 - 99 mg/dL Final   Glucose reference range applies only to samples taken after fasting for at least 8 hours.  . BUN 05/06/2020 10  8 - 23 mg/dL Final  . Creatinine, Ser 05/06/2020 0.57  0.44 - 1.00 mg/dL Final  . Calcium 05/06/2020 8.9  8.9 - 10.3 mg/dL Final  . Total Protein 05/06/2020 6.9  6.5 - 8.1 g/dL Final  . Albumin 05/06/2020 3.7  3.5 - 5.0 g/dL Final  . AST 05/06/2020 15  15 - 41 U/L Final  . ALT 05/06/2020 13  0 - 44 U/L Final  . Alkaline Phosphatase 05/06/2020 58  38 - 126 U/L Final  . Total Bilirubin 05/06/2020 0.4  0.3 - 1.2 mg/dL Final  . GFR, Estimated 05/06/2020 >60  >60 mL/min Final   Comment: (NOTE) Calculated using the CKD-EPI Creatinine Equation (2021)   . Anion gap 05/06/2020 6  5 - 15 Final   Performed at [facility], [address], [place], [place] [address]  . WBC 05/06/2020 5.3  4.0 - 10.5 K/uL Final  . RBC 05/06/2020 4.36  3.87 - 5.11 MIL/uL Final  . Hemoglobin 05/06/2020 12.4  12.0 - 15.0 g/dL Final  . HCT 05/06/2020 37.3  36.0 - 46.0 % Final  . MCV 05/06/2020 85.6  80.0 - 100.0 fL Final  . MCH 05/06/2020 28.4  26.0 - 34.0 pg Final  . MCHC 05/06/2020 33.2  30.0 - 36.0 g/dL Final  . RDW 05/06/2020 14.6  11.5 - 15.5 % Final  . Platelets 05/06/2020 222  150 - 400 K/uL Final  . nRBC 05/06/2020 0.0  0.0 - 0.2 % Final  . Neutrophils Relative % 05/06/2020 57  % Final  . Neutro Abs 05/06/2020 3.0  1.7 - 7.7 K/uL Final  . Lymphocytes Relative 05/06/2020 32  % Final  . Lymphs Abs 05/06/2020 1.7  0.7 - 4.0 K/uL Final  . Monocytes Relative 05/06/2020 9  % Final  . Monocytes Absolute 05/06/2020 0.5  0.1 - 1.0 K/uL Final  . Eosinophils Relative 05/06/2020 1  % Final  .  Eosinophils Absolute 05/06/2020 0.1  0.0 - 0.5 K/uL Final  . Basophils Relative 05/06/2020 1  % Final  . Basophils Absolute 05/06/2020 0.0  0.0 - 0.1 K/uL Final  . Immature Granulocytes 05/06/2020 0  % Final  . Abs  Immature Granulocytes 05/06/2020 0.02  0.00 - 0.07 K/uL Final   Performed at [facility], [address], [place], [place] [address]     Assessment:  Tanya Frey is a 75 y.o. female with stage III papillary carcinoma thyroid carcinoma s/p thyroidectomy in 02/21/2017.  Pathology revealed a 4.6 cm papillary thyroid carcinoma with extrathyroidal extension.  There was angioinvasion but no lymphatic invasion.  Margins were negative.  Pathologic stage was pT4a Nx (stage III).  Soft tissue neck CT on 01/11/2017 revealed a 5 cm complex cystic and solid mass arising from the right lobe of the thyroid compatible with known carcinoma. There was no malignant adenopathy.  She received 130.6 mCi I-131 with Thyrogen stimulation on 04/26/2017.  Whole body I-131 scan on 05/04/2017 revealed uptake at thyroid bed consistent with thyroid remnant.  There was no scintigraphic evidence of iodine-avid metastatic thyroid cancer.  Thyroid ultrasound on 08/06/2018 revealed no residual or recurrent tissue post thyroidectomy.  She has chronic swallowing issues post surgery.    She has stage IA right breast cancer s/p partial mastectomy on 01/14/2020.  Pathology revealed a 7 mm grade 1 invasive mammary carcinoma (NOS, ductal).  There was a separate focus of DCIS and intraductal papilloma.  DCIS margins were 3 mm; invasive carcinoma margins were 6 mm.  One sentinel lymph node was negative.  Tumor was ER+ (>90%), PR+ (> 90%), and HER-2 negative (score 0).  Pathologic stage was pT1bpN0.  CA27.29 was 16.3 on 01/07/2020.  Right diagnostic mammogram on 12/18/2019 revealed an irregular 6 mm mass in the RIGHT upper outer breast. Ultrasound-guided biopsy was recommended. There was no RIGHT axillary adenopathy.  Oncotype DX testing revealed a recurrence score of 16 which translated to a risk of distant recurrence at 9 years of 15% (95% CI 10-19%) and no benefit of chemotherapy.  She received radiation to the right  breast from 02/25/2020 - 03/27/2020. She received 42.56 Gy in 16 fractions. She received a Boost of 10 Gy in 5 fractions.  Bone density on 07/05/2016 revealed osteopenia with a T-score of -1.5 in L1-L4 and -13 in the left femoral neck.  Bone density on 08/15/2018 revealed osteopenia in the left femur neck with a T-score of -1.1 in the left femoral neck.  Bone density on 04/02/2020 ([facility]) revealed osteopenia with a T score of -1.5 in the lumbar spine L1-L4.  She continued calcium and vitamin D  Symptomatically, she feels "alright." She is able to walk on her right foot. She is ready to begin Femara.  She saw her dentist last week. Dental clearance for Prolia has been obtained. Exam is stable.  Plan: 1.   Labs today: CBC with diff, CMP. 2.   Stage IA right breast cancer  She is s/p partial mastectomy on 01/14/2020.   Tumor was grade I and 7 mm.   Tumor is ER/PR+ and Her2/neu-.   Margins were negative.   Sentinel lymph node was negative.  Oncotype DX testing revealed a recurrence score of 16 no benefit of chemotherapy.  She completed radiation on 03/27/2020.  She is ready to begin Femara.     Clearance has been obtained from orthopedics.   Potential side effects reviewed.  Patient consented to treatment.  Rx:  Femara 2.5 mg a day (dis #30 with 1 refill).  Continue surveillance. 3.   Stage III thyroid carcinoma  Patient is s/p thyroidectomy 02/2017.  Patient is s/p I-131.  Thyroid ultrasound on 08/06/2018 revealed no residual disease.  Labs on 11/28/2019 were unremarkable:   Thyroglobulin was <2.0, thyroglobulin antibody was <1.0.    Free T4 was 1.30 and TSH was 0.016.  Patient remains on Synthroid. 4.   Osteopenia  Bone density on 04/02/2020 revealed progressive osteopenia with a T score of -1.5 in the lumbar spine L1-L4.  She is on calcium and vitamin D.  Discuss initiation of Prolia.   Potential side effects reviewed.   Patient has been cleared by dentistry as well as orthopedic  surgery.   Patient consents to treatment.  Prolia today. 5.   Hyponatremia, improved  Sodium 135.  Continue to monitor. 6.   Prolia today. 7.   RTC in 1 month for MD assessment and labs (CMP).  I discussed the assessment and treatment plan with the patient.  The patient was provided an opportunity to ask questions and all were answered.  The patient agreed with the plan and demonstrated an understanding of the instructions.  The patient was advised to call back if the symptoms worsen or if the condition fails to improve as anticipated.     Lequita Asal, MD, PhD    05/06/2020, 1:24 PM   I, Mirian Mo Tufford, am acting as a Education administrator for Lequita Asal, MD.  I, [place] Mike Gip, MD, have reviewed the above documentation for accuracy and completeness, and I agree with the above.

## 2020-05-06 ENCOUNTER — Encounter: Payer: Self-pay | Admitting: Hematology and Oncology

## 2020-05-06 ENCOUNTER — Inpatient Hospital Stay: Payer: Medicare HMO

## 2020-05-06 ENCOUNTER — Inpatient Hospital Stay: Payer: Medicare HMO | Attending: Hematology and Oncology

## 2020-05-06 ENCOUNTER — Other Ambulatory Visit: Payer: Self-pay | Admitting: Hematology and Oncology

## 2020-05-06 ENCOUNTER — Inpatient Hospital Stay: Payer: Medicare HMO | Admitting: Hematology and Oncology

## 2020-05-06 ENCOUNTER — Other Ambulatory Visit: Payer: Self-pay

## 2020-05-06 VITALS — BP 119/67 | HR 87 | Temp 97.0°F | Resp 18 | Wt 154.3 lb

## 2020-05-06 DIAGNOSIS — M8588 Other specified disorders of bone density and structure, other site: Secondary | ICD-10-CM

## 2020-05-06 DIAGNOSIS — Z170 Estrogen receptor positive status [ER+]: Secondary | ICD-10-CM | POA: Insufficient documentation

## 2020-05-06 DIAGNOSIS — C73 Malignant neoplasm of thyroid gland: Secondary | ICD-10-CM | POA: Insufficient documentation

## 2020-05-06 DIAGNOSIS — M8580 Other specified disorders of bone density and structure, unspecified site: Secondary | ICD-10-CM | POA: Diagnosis not present

## 2020-05-06 DIAGNOSIS — C50411 Malignant neoplasm of upper-outer quadrant of right female breast: Secondary | ICD-10-CM

## 2020-05-06 LAB — COMPREHENSIVE METABOLIC PANEL
ALT: 13 U/L (ref 0–44)
AST: 15 U/L (ref 15–41)
Albumin: 3.7 g/dL (ref 3.5–5.0)
Alkaline Phosphatase: 58 U/L (ref 38–126)
Anion gap: 6 (ref 5–15)
BUN: 10 mg/dL (ref 8–23)
CO2: 28 mmol/L (ref 22–32)
Calcium: 8.9 mg/dL (ref 8.9–10.3)
Chloride: 100 mmol/L (ref 98–111)
Creatinine, Ser: 0.57 mg/dL (ref 0.44–1.00)
GFR, Estimated: >60 mL/min (ref >60)
Glucose, Bld: 114 mg/dL — ABNORMAL HIGH (ref 70–99)
Potassium: 4 mmol/L (ref 3.5–5.1)
Sodium: 134 mmol/L — ABNORMAL LOW (ref 135–145)
Total Bilirubin: 0.4 mg/dL (ref 0.3–1.2)
Total Protein: 6.9 g/dL (ref 6.5–8.1)

## 2020-05-06 LAB — CBC WITH DIFFERENTIAL/PLATELET
Abs Immature Granulocytes: 0.02 10*3/uL (ref 0.00–0.07)
Basophils Absolute: 0 10*3/uL (ref 0.0–0.1)
Basophils Relative: 1 %
Eosinophils Absolute: 0.1 10*3/uL (ref 0.0–0.5)
Eosinophils Relative: 1 %
HCT: 37.3 % (ref 36.0–46.0)
Hemoglobin: 12.4 g/dL (ref 12.0–15.0)
Immature Granulocytes: 0 %
Lymphocytes Relative: 32 %
Lymphs Abs: 1.7 10*3/uL (ref 0.7–4.0)
MCH: 28.4 pg (ref 26.0–34.0)
MCHC: 33.2 g/dL (ref 30.0–36.0)
MCV: 85.6 fL (ref 80.0–100.0)
Monocytes Absolute: 0.5 10*3/uL (ref 0.1–1.0)
Monocytes Relative: 9 %
Neutro Abs: 3 10*3/uL (ref 1.7–7.7)
Neutrophils Relative %: 57 %
Platelets: 222 10*3/uL (ref 150–400)
RBC: 4.36 MIL/uL (ref 3.87–5.11)
RDW: 14.6 % (ref 11.5–15.5)
WBC: 5.3 10*3/uL (ref 4.0–10.5)
nRBC: 0 % (ref 0.0–0.2)

## 2020-05-06 MED ORDER — LETROZOLE 2.5 MG PO TABS
2.5000 mg | ORAL_TABLET | Freq: Every day | ORAL | 1 refills | Status: DC
Start: 2020-05-06 — End: 2020-07-30

## 2020-05-06 MED ORDER — DENOSUMAB 60 MG/ML ~~LOC~~ SOSY
60.0000 mg | PREFILLED_SYRINGE | Freq: Once | SUBCUTANEOUS | Status: AC
  Administered 2020-05-06: 60 mg via SUBCUTANEOUS

## 2020-05-06 NOTE — Patient Instructions (Signed)
  Begin Femara tomorrow.

## 2020-05-27 NOTE — Progress Notes (Signed)
[facility]  [address] [place], [place] [address] Phone: [phone]  Fax: [phone]   Clinic Day:  05/28/2020  Referring physician: [name], MD  Chief Complaint: Tanya Frey is a 75 y.o. female with stage III papillary carcinoma thyroid and stage IA right breast cancer who is seen for 3 week assessment after initiation of Femara.  HPI:  The patient was last seen in the medical oncology clinic on 05/06/2020. At that time, she felt "alright." She was weight bearing on her right foot following recovery from a right ankle fracture.  She was ready to begin Femara.  She saw her dentist the week before. Dental clearance for Prolia had been obtained. Exam was stable. Hematocrit was 37.3, hemoglobin 12.4, platelets 222,000, WBC 5,300 with an ANC of 3000.  Sodium was 134.  She received Prolia. She was to begin Femara.  During the interim, he has been "good." She tolerated Prolia well. She started Femara on 05/11/2020; she is tolerating it well. She gets warm sometimes, but wouldn't call it a hot flash. She denies body aches.  She still has a boot on her right foot but is able to walk on it. She is following up with orthopedics next week.  She takes Synthroid 112 mcg daily.   Past Medical History:  Diagnosis Date  . Anemia   . Breast cancer in female [facility]) 12/27/2019   Right breast  . Difficult intubation   . Diffuse cystic mastopathy   . Family history of malignant neoplasm of breast   . Hyperlipidemia   . Obesity, unspecified   . Personal history of tobacco use, presenting hazards to health   . Thyroid cancer (HCC)    T4a, NX: 4.6 cm lesion with extrathyroidal extension. Received I 131 post procedure.    . Varicose veins     Past Surgical History:  Procedure Laterality Date  . BREAST LUMPECTOMY,RADIO FREQ LOCALIZER,AXILLARY SENTINEL LYMPH NODE BIOPSY Right 01/14/2020   Procedure: BREAST LUMPECTOMY,RADIO FREQ LOCALIZER,AXILLARY  SENTINEL LYMPH NODE BIOPSY;  Surgeon: Jules Husbands, MD;  Location: [facility] ORS;  Service: General;  Laterality: Right;  . BREAST SURGERY    . COLONOSCOPY  2011   Dr. [name]; colon polyps (tubular adenoma)  . COLONOSCOPY N/A   . COLONOSCOPY WITH PROPOFOL N/A 10/10/2014   Procedure: COLONOSCOPY WITH PROPOFOL;  Surgeon: [name], MD;  Location: [facility] ENDOSCOPY;  Service: Endoscopy;  Laterality: N/A;  . ORIF ANKLE FRACTURE Right 03/16/2020   Procedure: OPEN REDUCTION INTERNAL FIXATION (ORIF) ANKLE FRACTURE;  Surgeon: [name], MD;  Location: [facility] ORS;  Service: Orthopedics;  Laterality: Right;  . THYROID SURGERY Bilateral   . THYROIDECTOMY N/A 02/21/2017   Procedure: THYROIDECTOMY;  Surgeon: [name], MD;  Location: [facility] ORS;  Service: ENT;  Laterality: N/A;  . TUBAL LIGATION    . VARICOSE VEIN SURGERY    . vein closure procedure Right 2009    Family History  Problem Relation Age of Onset  . Breast cancer Daughter 40       [name] BRCA negative    Social History:  reports that she quit smoking about 30 years ago. She has a 10.00 pack-year smoking history. She has never used smokeless tobacco. She reports current alcohol use of about 1.0 - 4.0 standard drink of alcohol per week. She reports that she does not use drugs.  She is retired from [employer]. She lives in [place].  The patient is accompanied by her  husband today.  Allergies:  Allergies  Allergen Reactions  . Cefdinir Rash    Pt tolerated exposure to cefazolin  . Protonix [Pantoprazole Sodium] Rash    Current Medications: Current Outpatient Medications  Medication Sig Dispense Refill  . aspirin EC 81 MG tablet Take 81 mg by mouth daily. Swallow whole.    . Calcium-Magnesium-Vitamin D (CALCIUM 1200+D3 PO) Take 1,200 mg by mouth daily.     . cyanocobalamin 100 MCG tablet Take 100 mcg by mouth daily.    Marland Kitchen letrozole (FEMARA) 2.5 MG tablet Take 1 tablet (2.5 mg total) by mouth daily. 30 tablet 1   . levothyroxine (SYNTHROID) 112 MCG tablet TAKE 1 TABLET(112 MCG) BY MOUTH DAILY BEFORE AND BREAKFAST 30 tablet 2  . Multiple Vitamins-Minerals (MULTIVITAMIN WITH MINERALS) tablet Take 1 tablet by mouth daily.    . simvastatin (ZOCOR) 40 MG tablet Take 40 mg by mouth every evening.      No current facility-administered medications for this visit.    Review of Systems  Constitutional: Positive for weight loss (2 lbs). Negative for chills, diaphoresis, fever and malaise/fatigue.       Feels "good."  HENT: Negative.  Negative for congestion, ear discharge, ear pain, hearing loss, nosebleeds, sinus pain, sore throat and tinnitus.   Eyes: Negative.  Negative for blurred vision and double vision.  Respiratory: Negative.  Negative for cough, hemoptysis, sputum production and shortness of breath.   Cardiovascular: Negative.  Negative for chest pain, palpitations and leg swelling.  Gastrointestinal: Negative.  Negative for abdominal pain, blood in stool, constipation, diarrhea, heartburn, melena, nausea and vomiting.  Genitourinary: Negative.  Negative for dysuria, frequency, hematuria and urgency.  Musculoskeletal: Negative for back pain, joint pain, myalgias and neck pain.       Boot on right ankle.  Skin: Negative.  Negative for itching and rash.  Neurological: Negative.  Negative for dizziness, tingling, sensory change, weakness and headaches.  Endo/Heme/Allergies: Does not bruise/bleed easily.       Feels warm sometimes.  Psychiatric/Behavioral: Negative.  Negative for depression and memory loss. The patient is not nervous/anxious and does not have insomnia.   All other systems reviewed and are negative.   Performance status (ECOG):  1  Vitals: Blood pressure 118/60, pulse 62, temperature (!) 97.1 F (36.2 C), resp. rate 16, weight 152 lb 8 oz (69.2 kg), SpO2 100 %.   Physical Exam Vitals and nursing note reviewed.  Constitutional:      General: She is not in acute distress.     Appearance: She is well-developed. She is not diaphoretic.  HENT:     Head: Normocephalic and atraumatic.     Comments: Styled dark hair with slight graying.    Mouth/Throat:     Mouth: Mucous membranes are moist.     Pharynx: Oropharynx is clear. No oropharyngeal exudate.  Eyes:     General: No scleral icterus.    Extraocular Movements: Extraocular movements intact.     Conjunctiva/sclera: Conjunctivae normal.     Pupils: Pupils are equal, round, and reactive to light.  Neck:     Vascular: No JVD.  Cardiovascular:     Rate and Rhythm: Normal rate and regular rhythm.     Heart sounds: Normal heart sounds. No murmur heard.   Pulmonary:     Effort: Pulmonary effort is normal. No respiratory distress.     Breath sounds: Normal breath sounds. No wheezing or rales.  Chest:     Chest wall: No tenderness.  Breasts:  Right: Skin change (radiation changes) present. No swelling, bleeding, mass, tenderness or supraclavicular adenopathy.     Left: Skin change (fibrocystic changes superiorly) present. No swelling, bleeding, mass, tenderness or supraclavicular adenopathy.    Abdominal:     General: Bowel sounds are normal. There is no distension.     Palpations: Abdomen is soft. There is no mass.     Tenderness: There is no abdominal tenderness. There is no guarding or rebound.  Musculoskeletal:        General: No tenderness. Normal range of motion.     Cervical back: Normal range of motion and neck supple.     Comments: Boot on right foot.  Lymphadenopathy:     Head:     Right side of head: No preauricular, posterior auricular or occipital adenopathy.     Left side of head: No preauricular, posterior auricular or occipital adenopathy.     Cervical: No cervical adenopathy.     Upper Body:     Right upper body: No supraclavicular adenopathy.     Left upper body: No supraclavicular adenopathy.     Lower Body: No right inguinal adenopathy. No left inguinal adenopathy.  Skin:     General: Skin is warm and dry.     Coloration: Skin is not pale.     Findings: No erythema or rash.  Neurological:     Mental Status: She is alert and oriented to person, place, and time.  Psychiatric:        Behavior: Behavior normal.        Thought Content: Thought content normal.        Judgment: Judgment normal.    Appointment on 05/28/2020  Component Date Value Ref Range Status  . Sodium 05/28/2020 134* 135 - 145 mmol/L Final  . Potassium 05/28/2020 4.1  3.5 - 5.1 mmol/L Final  . Chloride 05/28/2020 101  98 - 111 mmol/L Final  . CO2 05/28/2020 28  22 - 32 mmol/L Final  . Glucose, Bld 05/28/2020 83  70 - 99 mg/dL Final   Glucose reference range applies only to samples taken after fasting for at least 8 hours.  . BUN 05/28/2020 11  8 - 23 mg/dL Final  . Creatinine, Ser 05/28/2020 0.64  0.44 - 1.00 mg/dL Final  . Calcium 05/28/2020 9.0  8.9 - 10.3 mg/dL Final  . Total Protein 05/28/2020 7.3  6.5 - 8.1 g/dL Final  . Albumin 05/28/2020 4.0  3.5 - 5.0 g/dL Final  . AST 05/28/2020 17  15 - 41 U/L Final  . ALT 05/28/2020 14  0 - 44 U/L Final  . Alkaline Phosphatase 05/28/2020 61  38 - 126 U/L Final  . Total Bilirubin 05/28/2020 0.4  0.3 - 1.2 mg/dL Final  . GFR, Estimated 05/28/2020 >60  >60 mL/min Final   Comment: (NOTE) Calculated using the CKD-EPI Creatinine Equation (2021)   . Anion gap 05/28/2020 5  5 - 15 Final   Performed at [facility], [address], [place], [place] [address]    Assessment:  JACHELLE FLUTY is a 75 y.o. female with stage III papillary carcinoma thyroid carcinoma s/p thyroidectomy in 02/21/2017.  Pathology revealed a 4.6 cm papillary thyroid carcinoma with extrathyroidal extension.  There was angioinvasion but no lymphatic invasion.  Margins were negative.  Pathologic stage was pT4a Nx (stage III).  Soft tissue neck CT on 01/11/2017 revealed a 5 cm complex cystic and solid mass arising from the right lobe of the thyroid compatible  with  known carcinoma. There was no malignant adenopathy.  She received 130.6 mCi I-131 with Thyrogen stimulation on 04/26/2017.  Whole body I-131 scan on 05/04/2017 revealed uptake at thyroid bed consistent with thyroid remnant.  There was no scintigraphic evidence of iodine-avid metastatic thyroid cancer.  Thyroid ultrasound on 08/06/2018 revealed no residual or recurrent tissue post thyroidectomy.  She has chronic swallowing issues post surgery.    She has stage IA right breast cancer s/p partial mastectomy on 01/14/2020.  Pathology revealed a 7 mm grade 1 invasive mammary carcinoma (NOS, ductal).  There was a separate focus of DCIS and intraductal papilloma.  DCIS margins were 3 mm; invasive carcinoma margins were 6 mm.  One sentinel lymph node was negative.  Tumor was ER+ (>90%), PR+ (> 90%), and HER-2 negative (score 0).  Pathologic stage was pT1bpN0.  CA27.29 was 16.3 on 01/07/2020.  Right diagnostic mammogram on 12/18/2019 revealed an irregular 6 mm mass in the RIGHT upper outer breast. Ultrasound-guided biopsy was recommended. There was no RIGHT axillary adenopathy.  Oncotype DX testing revealed a recurrence score of 16 which translated to a risk of distant recurrence at 9 years of 15% (95% CI 10-19%) and no benefit of chemotherapy.  She received radiation to the right breast from 02/25/2020 - 03/27/2020. She received 42.56 Gy in 16 fractions. She received a Boost of 10 Gy in 5 fractions. She began Femara on 05/11/2020.  Bone density on 07/05/2016 revealed osteopenia with a T-score of -1.5 in L1-L4 and -13 in the left femoral neck.  Bone density on 08/15/2018 revealed osteopenia in the left femur neck with a T-score of -1.1 in the left femoral neck.  Bone density on 04/02/2020 ([facility]) revealed osteopenia with a T score of -1.5 in the lumbar spine L1-L4.  She continued calcium and vitamin D.  She began Prolia on 05/06/2020.  Symptomatically, she feels "good."  She is tolerating Femara well.  Exam  is unremarkable.  Plan: 1.   Labs today: CMP, TSH, free T4, thyroglobulin, anti-thyroglobulin antibody. 2.   Stage IA right breast cancer  She is s/p partial mastectomy on 01/14/2020.   Tumor was grade I and 7 mm.   Tumor is ER/PR+ and Her2/neu-.   Margins were negative.   Sentinel lymph node was negative.  Oncotype DX testing revealed a recurrence score of 16 no benefit of chemotherapy.  She completed radiation on 03/27/2020.  She begin Femara on 05/11/2020.    Symptomatically, she is doing well.  Exam I unremarkable.  Continue surveillance. 3.   Stage III thyroid carcinoma  Patient is s/p thyroidectomy 02/2017.  Patient is s/p I-131.  Thyroid ultrasound on 08/06/2018 revealed no residual disease.  Labs on 11/28/2019 were unremarkable:   Thyroglobulin was <2.0, thyroglobulin antibody was <1.0.    Free T4 was 1.30 and TSH was 0.016.  Follow-up labs today.  She remains on Synthroid 112 mcg/day. 4.   Osteopenia  Bone density on 04/02/2020 revealed progressive osteopenia with a T score of -1.5 in the lumbar spine L1-L4.  She is on calcium and vitamin D.  She began every 6 month Prolia on 05/06/2020. 5.   Hyponatremia, improved  Sodium 134.  Continue to monitor. 6.   RTC in 3 months for MD assessment and labs (CBC with diff, CMP, CA27.29).  I discussed the assessment and treatment plan with the patient.  The patient was provided an opportunity to ask questions and all were answered.  The patient agreed with the plan and demonstrated  an understanding of the instructions.  The patient was advised to call back if the symptoms worsen or if the condition fails to improve as anticipated.     Lequita Asal, MD, PhD    05/28/2020, 11:25 AM   I, Evert Kohl, am acting as a Education administrator for Lequita Asal, MD.  I, Meta Mike Gip, MD, have reviewed the above documentation for accuracy and completeness, and I agree with the above.

## 2020-05-28 ENCOUNTER — Inpatient Hospital Stay: Payer: Medicare HMO

## 2020-05-28 ENCOUNTER — Encounter: Payer: Self-pay | Admitting: Hematology and Oncology

## 2020-05-28 ENCOUNTER — Other Ambulatory Visit: Payer: Self-pay

## 2020-05-28 ENCOUNTER — Other Ambulatory Visit: Payer: Self-pay | Admitting: Hematology and Oncology

## 2020-05-28 ENCOUNTER — Inpatient Hospital Stay: Payer: Medicare HMO | Attending: Hematology and Oncology | Admitting: Hematology and Oncology

## 2020-05-28 VITALS — BP 118/60 | HR 62 | Temp 97.1°F | Resp 16 | Wt 152.5 lb

## 2020-05-28 DIAGNOSIS — C73 Malignant neoplasm of thyroid gland: Secondary | ICD-10-CM

## 2020-05-28 DIAGNOSIS — M8588 Other specified disorders of bone density and structure, other site: Secondary | ICD-10-CM | POA: Diagnosis not present

## 2020-05-28 DIAGNOSIS — M8580 Other specified disorders of bone density and structure, unspecified site: Secondary | ICD-10-CM | POA: Diagnosis not present

## 2020-05-28 DIAGNOSIS — C50411 Malignant neoplasm of upper-outer quadrant of right female breast: Secondary | ICD-10-CM | POA: Diagnosis not present

## 2020-05-28 DIAGNOSIS — Z170 Estrogen receptor positive status [ER+]: Secondary | ICD-10-CM | POA: Diagnosis not present

## 2020-05-28 DIAGNOSIS — E871 Hypo-osmolality and hyponatremia: Secondary | ICD-10-CM

## 2020-05-28 LAB — COMPREHENSIVE METABOLIC PANEL
ALT: 14 U/L (ref 0–44)
AST: 17 U/L (ref 15–41)
Albumin: 4 g/dL (ref 3.5–5.0)
Alkaline Phosphatase: 61 U/L (ref 38–126)
Anion gap: 5 (ref 5–15)
BUN: 11 mg/dL (ref 8–23)
CO2: 28 mmol/L (ref 22–32)
Calcium: 9 mg/dL (ref 8.9–10.3)
Chloride: 101 mmol/L (ref 98–111)
Creatinine, Ser: 0.64 mg/dL (ref 0.44–1.00)
GFR, Estimated: >60 mL/min (ref >60)
Glucose, Bld: 83 mg/dL (ref 70–99)
Potassium: 4.1 mmol/L (ref 3.5–5.1)
Sodium: 134 mmol/L — ABNORMAL LOW (ref 135–145)
Total Bilirubin: 0.4 mg/dL (ref 0.3–1.2)
Total Protein: 7.3 g/dL (ref 6.5–8.1)

## 2020-05-28 LAB — T4, FREE: Free T4: 1.43 ng/dL — ABNORMAL HIGH (ref 0.61–1.12)

## 2020-05-28 LAB — TSH: TSH: 0.265 u[IU]/mL — ABNORMAL LOW (ref 0.350–4.500)

## 2020-05-28 NOTE — Progress Notes (Signed)
Patient denies new problems/concerns today.   °

## 2020-05-31 LAB — TGAB+THYROGLOBULIN IMA OR RIA: Thyroglobulin Antibody: <1 IU/mL (ref 0.0–0.9)

## 2020-05-31 LAB — THYROGLOBULIN BY IMA: Thyroglobulin by IMA: <0.1 ng/mL — ABNORMAL LOW (ref 1.5–38.5)

## 2020-06-01 DIAGNOSIS — Z9889 Other specified postprocedural states: Secondary | ICD-10-CM | POA: Diagnosis not present

## 2020-06-01 DIAGNOSIS — S82851E Displaced trimalleolar fracture of right lower leg, subsequent encounter for open fracture type I or II with routine healing: Secondary | ICD-10-CM | POA: Diagnosis not present

## 2020-06-01 DIAGNOSIS — Z8781 Personal history of (healed) traumatic fracture: Secondary | ICD-10-CM | POA: Diagnosis not present

## 2020-06-08 ENCOUNTER — Other Ambulatory Visit: Payer: Self-pay

## 2020-06-08 DIAGNOSIS — C73 Malignant neoplasm of thyroid gland: Secondary | ICD-10-CM

## 2020-06-08 MED ORDER — LEVOTHYROXINE SODIUM 112 MCG PO TABS: ORAL_TABLET | ORAL | 2 refills | Status: DC

## 2020-06-22 DIAGNOSIS — E039 Hypothyroidism, unspecified: Secondary | ICD-10-CM | POA: Diagnosis not present

## 2020-06-22 DIAGNOSIS — E7800 Pure hypercholesterolemia, unspecified: Secondary | ICD-10-CM | POA: Diagnosis not present

## 2020-06-23 ENCOUNTER — Other Ambulatory Visit: Payer: Self-pay

## 2020-06-23 DIAGNOSIS — C73 Malignant neoplasm of thyroid gland: Secondary | ICD-10-CM

## 2020-06-29 DIAGNOSIS — E039 Hypothyroidism, unspecified: Secondary | ICD-10-CM | POA: Diagnosis not present

## 2020-06-29 DIAGNOSIS — E7800 Pure hypercholesterolemia, unspecified: Secondary | ICD-10-CM | POA: Diagnosis not present

## 2020-06-29 DIAGNOSIS — Z1211 Encounter for screening for malignant neoplasm of colon: Secondary | ICD-10-CM | POA: Diagnosis not present

## 2020-06-29 DIAGNOSIS — Z1389 Encounter for screening for other disorder: Secondary | ICD-10-CM | POA: Diagnosis not present

## 2020-06-29 DIAGNOSIS — Z0001 Encounter for general adult medical examination with abnormal findings: Secondary | ICD-10-CM | POA: Diagnosis not present

## 2020-06-29 DIAGNOSIS — C50919 Malignant neoplasm of unspecified site of unspecified female breast: Secondary | ICD-10-CM | POA: Diagnosis not present

## 2020-06-29 DIAGNOSIS — Z0000 Encounter for general adult medical examination without abnormal findings: Secondary | ICD-10-CM | POA: Diagnosis not present

## 2020-07-21 ENCOUNTER — Other Ambulatory Visit: Payer: Self-pay | Admitting: General Surgery

## 2020-07-21 DIAGNOSIS — Z86010 Personal history of colonic polyps: Secondary | ICD-10-CM | POA: Diagnosis not present

## 2020-07-21 NOTE — Progress Notes (Signed)
Subjective:     Patient ID: Tanya Frey is a 75 y.o. female.   HPI   The following portions of the patient's history were reviewed and updated as appropriate.   This an established patient is here today for: office visit. Here to discuss having a colonoscopy, last completed in 2016. She denies any GI issues or rectal bleeding. She is here with her husband, Herbie Baltimore.     Review of Systems  Constitutional: Negative for chills and fever.  Respiratory: Negative for cough.   Gastrointestinal: Negative for anal bleeding and blood in stool.         Chief Complaint  Patient presents with   Pre-op Exam      BP 124/82   Pulse 90   Temp 36.2 C (97.2 F)   Ht 165.1 cm ('5\' 5"' )   Wt 68.9 kg (152 lb)   SpO2 98%   BMI 25.29 kg/m        Past Medical History:  Diagnosis Date   Anemia     Colon polyp     Difficult intubation     Diffuse cystic mastopathy     Fibrocystic breast disease     Hyperlipidemia     Malignant neoplasm of right female breast (CMS-HCC) 12/27/2019    pT1b,N0, Grade 1; ER/PR +; Her 2 neu not overexpressed ([place]).   Thyroid cancer (CMS-HCC)     Transient anemia             Past Surgical History:  Procedure Laterality Date   COLONOSCOPY   09/09/2009    Adenomatous Polyp: CBF 09/2014; Recall Ltr mailed 07/11/2014 (dw)   COLONOSCOPY   10/10/2014    Adenomatous Polyp: CBF 10/2019   LAPAROSCOPIC TUBAL LIGATION       MASTECTOMY PARTIAL / LUMPECTOMY Right 01/14/2020    Dr Dahlia Byes   Open reduction and internal fixation of grade 1 open trimalleolar fracture dislocation with fixation of the medial and lateral malleolar fragments, right ankle Right 03/16/2020    Dr.Poggi   THYROIDECTOMY TOTAL   02/21/2017    Surgery done with [name]    vein closure procedure Right 2009                OB History     Gravida  3   Para  2   Term      Preterm      AB      Living         SAB      IAB      Ectopic      Molar      Multiple      Live  Births           Obstetric Comments  Age at first period 35 Age of first pregnancy 43               Social History           Socioeconomic History   Marital status: Married  Tobacco Use   Smoking status: Former Smoker      Packs/day: 1.00      Years: 10.00      Pack years: 10.00      Types: Cigarettes      Quit date: 02/07/1990      Years since quitting: 30.4   Smokeless tobacco: Never Used  Substance and Sexual Activity   Alcohol use: Yes      Alcohol/week: 0.0 standard drinks  Allergies  Allergen Reactions   Cefdinir Rash   Pantoprazole Sodium Anaphylaxis and Other (See Comments)      Current Medications        Current Outpatient Medications  Medication Sig Dispense Refill   aspirin 81 MG EC tablet Take by mouth Take 81 mg by mouth daily. Swallow whole.       calcium carbonate-vitamin D3 (CALTRATE 600+D) 600 mg(1,558m) -200 unit tablet Take 1 tablet by mouth 2 (two) times daily with meals.       cyanocobalamin (VITAMIN B12) 100 MCG tablet Take by mouth Take 100 mcg by mouth daily.       letrozole ([facility] 2.5 mg tablet Take by mouth Take 1 tablet (2.5 mg total) by mouth daily.       levothyroxine (SYNTHROID) 112 MCG tablet Take 1 tablet (112 mcg total) by mouth once daily Take on an empty stomach with a glass of water at least 30-60 minutes before breakfast. 90 tablet 1   multivitamin with minerals tablet Take 1 tablet by mouth once daily       simvastatin (ZOCOR) 40 MG tablet TAKE 1 TABLET(40 MG) BY MOUTH EVERY NIGHT 90 tablet 1   bisacodyL (DULCOLAX) 5 mg EC tablet Take two tablets morning and two tablets afternoon day prior to Miralax prep. 4 tablet 0   polyethylene glycol (MIRALAX) powder One bottle for colonoscopy prep. Use as directed. 255 g 0    No current facility-administered medications for this visit.             Family History  Problem Relation Age of Onset   Coronary Artery Disease (Blocked arteries around heart) Father     Breast  cancer Daughter     Colon cancer Neg Hx                 Objective:   Physical Exam Exam conducted with a chaperone present.  Constitutional:      Appearance: Normal appearance.  Cardiovascular:     Rate and Rhythm: Normal rate and regular rhythm.     Pulses: Normal pulses.     Heart sounds: Normal heart sounds.     Comments: Frequent premature beats Pulmonary:     Effort: Pulmonary effort is normal.     Breath sounds: Normal breath sounds.  Musculoskeletal:     Cervical back: Neck supple.  Skin:    General: Skin is warm and dry.  Neurological:     Mental Status: She is alert and oriented to person, place, and time.  Psychiatric:        Mood and Affect: Mood normal.        Behavior: Behavior normal.      Labs and Radiology:    October 10, 2014 colonoscopy pathology:   DIAGNOSIS:  A. COLON POLYP, SIGMOID; COLD BIOPSY:  - TUBULAR ADENOMA.  - NEGATIVE FOR HIGH-GRADE DYSPLASIA AND MALIGNANCY.   B. COLON POLYP, SIGMOID; COLD BIOPSY:  - HYPERPLASTIC POLYP.  - NEGATIVE FOR DYSPLASIA AND MALIGNANCY.   March 23, 2020 ECG:   Sinus rhythm with premature atrial complexes  Otherwise normal ECG  No previous ECGs available  I reviewed and concur with this report. Electronically signed [id number] JJenny Reichmann([phone] on 04/01/2020 8:32:14 AM   Jun 22, 2020 laboratory:   WBC ([facility] 2Blood Cell Count) 4.1 - 10.2 10^3/uL 5.4   RBC (Red Blood Cell Count) 4.04 - 5.48 10^6/uL 4.48   Hemoglobin 12.0 - 15.0 gm/dL 12.4   Hematocrit 35.0 - 47.0 % 38.8  MCV (Mean Corpuscular Volume) 80.0 - 100.0 fl 86.6   MCH (Mean Corpuscular Hemoglobin) 27.0 - 31.2 pg 27.7   MCHC (Mean Corpuscular Hemoglobin Concentration) 32.0 - 36.0 gm/dL 32.0   Platelet Count 150 - 450 10^3/uL 258   RDW-CV (Red Cell Distribution Width) 11.6 - 14.8 % 14.0   MPV (Mean Platelet Volume) 9.4 - 12.4 fl 10.2   Neutrophils 1.50 - 7.80 10^3/uL 2.86   Lymphocytes 1.00 - 3.60 10^3/uL 1.77   Monocytes 0.00 - 1.50 10^3/uL  0.55   Eosinophils 0.00 - 0.55 10^3/uL 0.17   Basophils 0.00 - 0.09 10^3/uL 0.05     Glucose 70 - 110 mg/dL 85   Sodium 136 - 145 mmol/L 135 Low    Potassium 3.6 - 5.1 mmol/L 5.1   Chloride 97 - 109 mmol/L 100   Carbon Dioxide (CO2) 22.0 - 32.0 mmol/L 29.5   Urea Nitrogen (BUN) 7 - 25 mg/dL 11   Creatinine 0.6 - 1.1 mg/dL 0.7   Glomerular Filtration Rate (eGFR), MDRD Estimate >60 mL/min/1.73sq m 82   Calcium 8.7 - 10.3 mg/dL 9.2   AST  8 - 39 U/L 16   ALT  5 - 38 U/L 10   Alk Phos (alkaline Phosphatase) 34 - 104 U/L 52   Albumin 3.5 - 4.8 g/dL 3.9   Bilirubin, Total 0.3 - 1.2 mg/dL 0.3   Protein, Total 6.1 - 7.9 g/dL 6.2   A/G Ratio 1.0 - 5.0 gm/dL 1.7                Assessment:     History colon polyps.    Plan:     Reviewed change in screening protocol recommendations that occurred in 2020: 1-2 polyps less than 10 mm can be reexamined in 7-9 years or at the original recommendations at the time of initial diagnosis (2016: 5-year follow-up.   The patient is decided she wants to go ahead and get this done.  Reviewed the procedure as well as risk.    This note is partially prepared by Karie Fetch, RN, acting as a scribe in the presence of Dr. Hervey Ard, MD.  The documentation recorded by the scribe accurately reflects the service I personally performed and the decisions made by me.    Robert Bellow, MD FACS

## 2020-07-30 ENCOUNTER — Other Ambulatory Visit: Payer: Self-pay

## 2020-07-30 DIAGNOSIS — Z170 Estrogen receptor positive status [ER+]: Secondary | ICD-10-CM

## 2020-08-01 ENCOUNTER — Encounter: Payer: Self-pay | Admitting: Hematology and Oncology

## 2020-08-01 MED ORDER — LETROZOLE 2.5 MG PO TABS: 2.5000 mg | ORAL_TABLET | Freq: Every day | ORAL | 0 refills | Status: DC

## 2020-08-05 ENCOUNTER — Encounter: Payer: Self-pay | Admitting: General Surgery

## 2020-08-05 ENCOUNTER — Ambulatory Visit: Payer: Medicare HMO | Admitting: Anesthesiology

## 2020-08-05 ENCOUNTER — Ambulatory Visit
Admission: RE | Admit: 2020-08-05 | Discharge: 2020-08-05 | Disposition: A | Discharge status: Home / Self Care | Payer: Medicare HMO | Source: Ambulatory Visit | Attending: General Surgery | Admitting: General Surgery

## 2020-08-05 ENCOUNTER — Encounter
Admission: RE | Disposition: A | Discharge status: Home / Self Care | Payer: Self-pay | Source: Ambulatory Visit | Attending: General Surgery

## 2020-08-05 DIAGNOSIS — Z86010 Personal history of colonic polyps: Secondary | ICD-10-CM | POA: Diagnosis not present

## 2020-08-05 DIAGNOSIS — Z888 Allergy status to other drugs, medicaments and biological substances status: Secondary | ICD-10-CM | POA: Insufficient documentation

## 2020-08-05 DIAGNOSIS — Z79890 Hormone replacement therapy (postmenopausal): Secondary | ICD-10-CM | POA: Insufficient documentation

## 2020-08-05 DIAGNOSIS — Z881 Allergy status to other antibiotic agents status: Secondary | ICD-10-CM | POA: Diagnosis not present

## 2020-08-05 DIAGNOSIS — Z79899 Other long term (current) drug therapy: Secondary | ICD-10-CM | POA: Insufficient documentation

## 2020-08-05 DIAGNOSIS — Z7982 Long term (current) use of aspirin: Secondary | ICD-10-CM | POA: Diagnosis not present

## 2020-08-05 DIAGNOSIS — Z79811 Long term (current) use of aromatase inhibitors: Secondary | ICD-10-CM | POA: Insufficient documentation

## 2020-08-05 DIAGNOSIS — Z87891 Personal history of nicotine dependence: Secondary | ICD-10-CM | POA: Insufficient documentation

## 2020-08-05 DIAGNOSIS — Z1211 Encounter for screening for malignant neoplasm of colon: Secondary | ICD-10-CM | POA: Insufficient documentation

## 2020-08-05 DIAGNOSIS — E039 Hypothyroidism, unspecified: Secondary | ICD-10-CM | POA: Diagnosis not present

## 2020-08-05 HISTORY — PX: COLONOSCOPY WITH PROPOFOL: SHX5780

## 2020-08-05 SURGERY — COLONOSCOPY WITH PROPOFOL
Anesthesia: General

## 2020-08-05 MED ORDER — SODIUM CHLORIDE 0.9 % IV SOLN
INTRAVENOUS | Status: DC
  Administered 2020-08-05: 1000 mL via INTRAVENOUS

## 2020-08-05 MED ORDER — PROPOFOL 500 MG/50ML IV EMUL
INTRAVENOUS | Status: AC
  Filled 2020-08-05: qty 50

## 2020-08-05 MED ORDER — LIDOCAINE HCL (CARDIAC) PF 100 MG/5ML IV SOSY
PREFILLED_SYRINGE | INTRAVENOUS | Status: DC | PRN
  Administered 2020-08-05: 40 mg via INTRAVENOUS

## 2020-08-05 MED ORDER — PROPOFOL 500 MG/50ML IV EMUL
INTRAVENOUS | Status: DC | PRN
  Administered 2020-08-05: 150 ug/kg/min via INTRAVENOUS

## 2020-08-05 MED ORDER — PROPOFOL 10 MG/ML IV BOLUS
INTRAVENOUS | Status: DC | PRN
  Administered 2020-08-05: 70 mg via INTRAVENOUS

## 2020-08-05 NOTE — Transfer of Care (Signed)
Immediate Anesthesia Transfer of Care Note  Patient: Tanya Frey  Procedure(s) Performed: Procedure(s): COLONOSCOPY WITH PROPOFOL (N/A)  Patient Location: PACU and Endoscopy Unit  Anesthesia Type:General  Level of Consciousness: sedated  Airway & Oxygen Therapy: Patient Spontanous Breathing and Patient connected to nasal cannula oxygen  Post-op Assessment: Report given to RN and Post -op Vital signs reviewed and stable  Post vital signs: Reviewed and stable  Last Vitals:  Vitals:   08/05/20 0853 08/05/20 1002  BP: 127/80 (!) 107/54  Pulse: 80 62  Resp: 18 17  Temp: (!) 35.6 C (!) 36.4 C  SpO2: 119% 417%    Complications: No apparent anesthesia complications

## 2020-08-05 NOTE — Anesthesia Postprocedure Evaluation (Signed)
Anesthesia Post Note  Patient: Tanya Frey  Procedure(s) Performed: COLONOSCOPY WITH PROPOFOL  Patient location during evaluation: Endoscopy Anesthesia Type: General Level of consciousness: awake and alert Pain management: pain level controlled Vital Signs Assessment: post-procedure vital signs reviewed and stable Respiratory status: spontaneous breathing, nonlabored ventilation, respiratory function stable and patient connected to nasal cannula oxygen Cardiovascular status: blood pressure returned to baseline and stable Postop Assessment: no apparent nausea or vomiting Anesthetic complications: no   No notable events documented.   Last Vitals:  Vitals:   08/05/20 1022 08/05/20 1032  BP: (!) 149/67 (!) 162/72  Pulse: 64 62  Resp: 18 (!) 21  Temp:    SpO2: 100% 96%    Last Pain:  Vitals:   08/05/20 1032  TempSrc:   PainSc: 0-No pain                 Precious Haws Alecea Trego

## 2020-08-05 NOTE — Op Note (Signed)
[facility] Gastroenterology Patient Name: [place] Procedure Date: 08/05/2020 9:29 AM MRN: [id number] Account #: [id number] Date of Birth: 12-23-1945 Admit Type: Outpatient Age: 75 Room: [facility] ENDO ROOM 1 Gender: Female Note Status: Finalized Procedure:             Colonoscopy Indications:           High risk colon cancer surveillance: Personal history                         of colonic polyps Providers:             Robert Bellow, MD Medicines:             Propofol per Anesthesia Complications:         No immediate complications. Procedure:             Pre-Anesthesia Assessment:                        - Prior to the procedure, a History and Physical was                         performed, and patient medications, allergies and                         sensitivities were reviewed. The patient's tolerance                         of previous anesthesia was reviewed.                        - The risks and benefits of the procedure and the                         sedation options and risks were discussed with the                         patient. All questions were answered and informed                         consent was obtained.                        After obtaining informed consent, the colonoscope was                         passed under direct vision. Throughout the procedure,                         the patient's blood pressure, pulse, and oxygen                         saturations were monitored continuously. The                         Colonoscope was introduced through the anus and                         advanced to the the cecum, identified by appendiceal  orifice and ileocecal valve. The colonoscopy was                         performed without difficulty. The patient tolerated                         the procedure well. The quality of the bowel                         preparation was excellent. Findings:      The entire  examined colon appeared normal on direct and retroflexion       views. Impression:            - The entire examined colon is normal on direct and                         retroflexion views.                        - No specimens collected. Recommendation:        - Repeat colonoscopy in 7 years for surveillance. Procedure Code(s):     --- Professional ---                        [phone], Colonoscopy, flexible; diagnostic, including                         collection of specimen(s) by brushing or washing, when                         performed (separate procedure) Diagnosis Code(s):     --- Professional ---                        Z86.010, Personal history of colonic polyps CPT copyright 2019 American Medical Association. All rights reserved. The codes documented in this report are preliminary and upon coder review may  be revised to meet current compliance requirements. Robert Bellow, MD 08/05/2020 9:57:45 AM This report has been signed electronically. Number of Addenda: 0 Note Initiated On: 08/05/2020 9:29 AM Scope Withdrawal Time: 0 hours 9 minutes 7 seconds  Total Procedure Duration: 0 hours 14 minutes 13 seconds  Estimated Blood Loss:  Estimated blood loss: none.      [facility]

## 2020-08-05 NOTE — Anesthesia Preprocedure Evaluation (Signed)
Anesthesia Evaluation  Patient identified by MRN, date of birth, ID band Patient awake    Reviewed: Allergy & Precautions, NPO status , Patient's Chart, lab work & pertinent test results  History of Anesthesia Complications (+) DIFFICULT AIRWAYNegative for: history of anesthetic complications  Airway Mallampati: III  TM Distance: <3 FB Neck ROM: limited    Dental  (+) Missing   Pulmonary neg shortness of breath, former smoker,    Pulmonary exam normal        Cardiovascular Exercise Tolerance: Good (-) anginanegative cardio ROS Normal cardiovascular exam     Neuro/Psych negative neurological ROS  negative psych ROS   GI/Hepatic negative GI ROS, Neg liver ROS, neg GERD  ,  Endo/Other  Hypothyroidism   Renal/GU negative Renal ROS  negative genitourinary   Musculoskeletal   Abdominal   Peds  Hematology negative hematology ROS (+)   Anesthesia Other Findings Past Medical History: No date: Anemia 12/27/2019: Breast cancer in female [facility])     Comment:  Right breast No date: Difficult intubation No date: Diffuse cystic mastopathy No date: Family history of malignant neoplasm of breast No date: Hyperlipidemia No date: Obesity, unspecified No date: Personal history of tobacco use, presenting hazards to health No date: Thyroid cancer ([place])     Comment:  T4a, NX: 4.6 cm lesion with extrathyroidal extension.               Received I 131 post procedure.   No date: Varicose veins  Past Surgical History: 01/14/2020: BREAST LUMPECTOMY,RADIO FREQ LOCALIZER,AXILLARY SENTINEL  LYMPH NODE BIOPSY; Right     Comment:  Procedure: BREAST LUMPECTOMY,RADIO FREQ               LOCALIZER,AXILLARY SENTINEL LYMPH NODE BIOPSY;  Surgeon:               Jules Husbands, MD;  Location: [facility] ORS;  Service:               General;  Laterality: Right; No date: BREAST SURGERY 2011: COLONOSCOPY     Comment:  Dr. [name]; colon polyps (tubular  adenoma) No date: COLONOSCOPY; N/A 10/10/2014: COLONOSCOPY WITH PROPOFOL; N/A     Comment:  Procedure: COLONOSCOPY WITH PROPOFOL;  Surgeon: [name], MD;  Location: [facility] ENDOSCOPY;  Service:               Endoscopy;  Laterality: N/A; No date: FRACTURE SURGERY 03/16/2020: ORIF ANKLE FRACTURE; Right     Comment:  Procedure: OPEN REDUCTION INTERNAL FIXATION (ORIF) ANKLE              FRACTURE;  Surgeon: [name], MD;  Location: [facility]               ORS;  Service: Orthopedics;  Laterality: Right; No date: THYROID SURGERY; Bilateral 02/21/2017: THYROIDECTOMY; N/A     Comment:  Procedure: THYROIDECTOMY;  Surgeon: [name],               MD;  Location: [facility] ORS;  Service: ENT;  Laterality: N/A; No date: TUBAL LIGATION No date: VARICOSE VEIN SURGERY 2009: vein closure procedure; Right  BMI    Body Mass Index: 24.51 kg/m      Reproductive/Obstetrics negative OB ROS                             Anesthesia Physical Anesthesia Plan  ASA: 3  Anesthesia Plan: General   Post-op Pain Management:    Induction: Intravenous  PONV Risk Score and Plan: Propofol infusion and TIVA  Airway Management Planned: Natural Airway and Nasal Cannula  Additional Equipment:   Intra-op Plan:   Post-operative Plan:   Informed Consent: I have reviewed the patients History and Physical, chart, labs and discussed the procedure including the risks, benefits and alternatives for the proposed anesthesia with the patient or authorized representative who has indicated his/her understanding and acceptance.     Dental Advisory Given  Plan Discussed with: Anesthesiologist, CRNA and Surgeon  Anesthesia Plan Comments: (Patient consented for risks of anesthesia including but not limited to:  - adverse reactions to medications - risk of airway placement if required - damage to eyes, teeth, lips or other oral mucosa - nerve damage due to positioning  -  sore throat or hoarseness - Damage to heart, brain, nerves, lungs, other parts of body or loss of life  Patient voiced understanding.)        Anesthesia Quick Evaluation

## 2020-08-05 NOTE — H&P (Signed)
[place] [id number] 05-06-1945     HPI: Healthy 75 y/o woman with tubular adenoma in 2016.  For follow up exam.   Medications Prior to Admission  Medication Sig Dispense Refill Last Dose   aspirin EC 81 MG tablet Take 81 mg by mouth daily. Swallow whole.   08/04/2020   Calcium-Magnesium-Vitamin D (CALCIUM 1200+D3 PO) Take 1,200 mg by mouth daily.    08/04/2020   cyanocobalamin 100 MCG tablet Take 100 mcg by mouth daily.   08/04/2020   letrozole (FEMARA) 2.5 MG tablet Take 1 tablet (2.5 mg total) by mouth daily. 30 tablet 0 08/04/2020   levothyroxine (SYNTHROID) 112 MCG tablet TAKE 1 TABLET(112 MCG) BY MOUTH DAILY BEFORE AND BREAKFAST 30 tablet 2 08/04/2020   Multiple Vitamins-Minerals (MULTIVITAMIN WITH MINERALS) tablet Take 1 tablet by mouth daily.   08/04/2020   simvastatin (ZOCOR) 40 MG tablet Take 40 mg by mouth every evening.    08/04/2020   Allergies  Allergen Reactions   Cefdinir Rash    Pt tolerated exposure to cefazolin   Protonix [Pantoprazole Sodium] Rash   Past Medical History:  Diagnosis Date   Anemia    Breast cancer in female [facility]) 12/27/2019   Right breast   Difficult intubation    Diffuse cystic mastopathy    Family history of malignant neoplasm of breast    Hyperlipidemia    Obesity, unspecified    Personal history of tobacco use, presenting hazards to health    Thyroid cancer ([place])    T4a, NX: 4.6 cm lesion with extrathyroidal extension. Received I 131 post procedure.     Varicose veins    Past Surgical History:  Procedure Laterality Date   BREAST LUMPECTOMY,RADIO FREQ LOCALIZER,AXILLARY SENTINEL LYMPH NODE BIOPSY Right 01/14/2020   Procedure: BREAST LUMPECTOMY,RADIO FREQ LOCALIZER,AXILLARY SENTINEL LYMPH NODE BIOPSY;  Surgeon: Jules Husbands, MD;  Location: [facility] ORS;  Service: General;  Laterality: Right;   BREAST SURGERY     COLONOSCOPY  2011   Dr. [name]; colon polyps (tubular adenoma)   COLONOSCOPY N/A    COLONOSCOPY WITH PROPOFOL N/A 10/10/2014    Procedure: COLONOSCOPY WITH PROPOFOL;  Surgeon: [name], MD;  Location: [place];  Service: Endoscopy;  Laterality: N/A;   FRACTURE SURGERY     ORIF ANKLE FRACTURE Right 03/16/2020   Procedure: OPEN REDUCTION INTERNAL FIXATION (ORIF) ANKLE FRACTURE;  Surgeon: [name], MD;  Location: [facility] ORS;  Service: Orthopedics;  Laterality: Right;   THYROID SURGERY Bilateral    THYROIDECTOMY N/A 02/21/2017   Procedure: THYROIDECTOMY;  Surgeon: [name], MD;  Location: [facility] ORS;  Service: ENT;  Laterality: N/A;   TUBAL LIGATION     VARICOSE VEIN SURGERY     vein closure procedure Right 2009   Social History   Socioeconomic History   Marital status: Married    Spouse name: Not on file   Number of children: Not on file   Years of education: Not on file   Highest education level: Not on file  Occupational History   Not on file  Tobacco Use   Smoking status: Former    Packs/day: 1.00    Years: 10.00    Pack years: 10.00    Types: Cigarettes    Quit date: 02/07/1990    Years since quitting: 30.5   Smokeless tobacco: Never  Vaping Use   Vaping Use: Never used  Substance and Sexual Activity   Alcohol use: Yes    Alcohol/week: 1.0 - 4.0 standard drink  Types: 1 - 4 Standard drinks or equivalent per week    Comment: wine on the weekend   Drug use: No   Sexual activity: Not on file  Other Topics Concern   Not on file  Social History Narrative   Not on file   Social Determinants of Health   Financial Resource Strain: Not on file  Food Insecurity: Not on file  Transportation Needs: Not on file  Physical Activity: Not on file  Stress: Not on file  Social Connections: Not on file  Intimate Partner Violence: Not on file   Social History   Social History Narrative   Not on file     ROS: Negative.     PE: HEENT: Negative. Lungs: Clear. Cardio: RR.   Assessment/Plan:  Proceed with planned endoscopy.    Forest Gleason [facility] 08/05/2020

## 2020-08-06 ENCOUNTER — Encounter: Payer: Self-pay | Admitting: General Surgery

## 2020-08-26 ENCOUNTER — Other Ambulatory Visit: Payer: Self-pay

## 2020-08-26 DIAGNOSIS — C73 Malignant neoplasm of thyroid gland: Secondary | ICD-10-CM

## 2020-08-27 ENCOUNTER — Encounter: Payer: Self-pay | Admitting: Oncology

## 2020-08-27 ENCOUNTER — Inpatient Hospital Stay (HOSPITAL_BASED_OUTPATIENT_CLINIC_OR_DEPARTMENT_OTHER): Payer: Medicare HMO | Admitting: Oncology

## 2020-08-27 ENCOUNTER — Other Ambulatory Visit: Payer: Self-pay

## 2020-08-27 ENCOUNTER — Inpatient Hospital Stay: Payer: Medicare HMO | Attending: Oncology

## 2020-08-27 VITALS — BP 132/58 | HR 70 | Temp 96.2°F | Resp 20 | Wt 150.8 lb

## 2020-08-27 DIAGNOSIS — M8589 Other specified disorders of bone density and structure, multiple sites: Secondary | ICD-10-CM | POA: Insufficient documentation

## 2020-08-27 DIAGNOSIS — Z170 Estrogen receptor positive status [ER+]: Secondary | ICD-10-CM | POA: Insufficient documentation

## 2020-08-27 DIAGNOSIS — C73 Malignant neoplasm of thyroid gland: Secondary | ICD-10-CM | POA: Diagnosis not present

## 2020-08-27 DIAGNOSIS — M8588 Other specified disorders of bone density and structure, other site: Secondary | ICD-10-CM

## 2020-08-27 DIAGNOSIS — Z7982 Long term (current) use of aspirin: Secondary | ICD-10-CM | POA: Diagnosis not present

## 2020-08-27 DIAGNOSIS — Z85850 Personal history of malignant neoplasm of thyroid: Secondary | ICD-10-CM | POA: Diagnosis not present

## 2020-08-27 DIAGNOSIS — Z79811 Long term (current) use of aromatase inhibitors: Secondary | ICD-10-CM | POA: Diagnosis not present

## 2020-08-27 DIAGNOSIS — Z79899 Other long term (current) drug therapy: Secondary | ICD-10-CM | POA: Insufficient documentation

## 2020-08-27 DIAGNOSIS — C50411 Malignant neoplasm of upper-outer quadrant of right female breast: Secondary | ICD-10-CM

## 2020-08-27 DIAGNOSIS — E890 Postprocedural hypothyroidism: Secondary | ICD-10-CM | POA: Diagnosis not present

## 2020-08-27 DIAGNOSIS — Z9011 Acquired absence of right breast and nipple: Secondary | ICD-10-CM | POA: Insufficient documentation

## 2020-08-27 DIAGNOSIS — E785 Hyperlipidemia, unspecified: Secondary | ICD-10-CM | POA: Insufficient documentation

## 2020-08-27 DIAGNOSIS — Z87891 Personal history of nicotine dependence: Secondary | ICD-10-CM | POA: Diagnosis not present

## 2020-08-27 LAB — CBC WITH DIFFERENTIAL/PLATELET
Abs Immature Granulocytes: 0.01 10*3/uL (ref 0.00–0.07)
Basophils Absolute: 0.1 10*3/uL (ref 0.0–0.1)
Basophils Relative: 1 %
Eosinophils Absolute: 0.1 10*3/uL (ref 0.0–0.5)
Eosinophils Relative: 2 %
HCT: 38.9 % (ref 36.0–46.0)
Hemoglobin: 12.8 g/dL (ref 12.0–15.0)
Immature Granulocytes: 0 %
Lymphocytes Relative: 36 %
Lymphs Abs: 1.7 10*3/uL (ref 0.7–4.0)
MCH: 27.6 pg (ref 26.0–34.0)
MCHC: 32.9 g/dL (ref 30.0–36.0)
MCV: 83.8 fL (ref 80.0–100.0)
Monocytes Absolute: 0.4 10*3/uL (ref 0.1–1.0)
Monocytes Relative: 8 %
Neutro Abs: 2.4 10*3/uL (ref 1.7–7.7)
Neutrophils Relative %: 53 %
Platelets: 213 10*3/uL (ref 150–400)
RBC: 4.64 MIL/uL (ref 3.87–5.11)
RDW: 16.1 % — ABNORMAL HIGH (ref 11.5–15.5)
WBC: 4.6 10*3/uL (ref 4.0–10.5)
nRBC: 0 % (ref 0.0–0.2)

## 2020-08-27 LAB — COMPREHENSIVE METABOLIC PANEL
ALT: 16 U/L (ref 0–44)
AST: 19 U/L (ref 15–41)
Albumin: 4.1 g/dL (ref 3.5–5.0)
Alkaline Phosphatase: 34 U/L — ABNORMAL LOW (ref 38–126)
Anion gap: 5 (ref 5–15)
BUN: 13 mg/dL (ref 8–23)
CO2: 28 mmol/L (ref 22–32)
Calcium: 9.3 mg/dL (ref 8.9–10.3)
Chloride: 101 mmol/L (ref 98–111)
Creatinine, Ser: 0.65 mg/dL (ref 0.44–1.00)
GFR, Estimated: >60 mL/min (ref >60)
Glucose, Bld: 104 mg/dL — ABNORMAL HIGH (ref 70–99)
Potassium: 4.4 mmol/L (ref 3.5–5.1)
Sodium: 134 mmol/L — ABNORMAL LOW (ref 135–145)
Total Bilirubin: 0.6 mg/dL (ref 0.3–1.2)
Total Protein: 7.1 g/dL (ref 6.5–8.1)

## 2020-08-27 NOTE — Progress Notes (Signed)
[place], [place]  [address], [address] [place], [place] [address] Phone: [phone]  Fax: [phone]   Clinic Day:  08/27/2020  Referring physician: [name], MD  Chief Complaint: [place] is a 75 y.o. female presents for stage III papillary carcinoma thyroid and stage IA right breast cancer   PERTINENT ONCOLOGY HISTORY Patient previously followed up by Dr.[name], patient switched care to me on 08/27/20 Extensive medical record review was performed by me   # Stage III papillary carcinoma thyroid carcinoma s/p thyroidectomy in 02/21/2017.  Pathology revealed a 4.6 cm papillary thyroid carcinoma with extrathyroidal extension.  There was angioinvasion but no lymphatic invasion.  Margins were negative.  Pathologic stage was pT4a Nx (stage III).  Soft tissue neck CT on 01/11/2017 revealed a 5 cm complex cystic and solid mass arising from the right lobe of the thyroid compatible with known carcinoma. There was no malignant adenopathy.She received 130.6 mCi I-131 with Thyrogen stimulation on 04/26/2017.  Whole body I-131 scan on 05/04/2017 revealed uptake at thyroid bed consistent with thyroid remnant.  There was no scintigraphic evidence of iodine-avid metastatic thyroid cancer.  Thyroid ultrasound on 08/06/2018 revealed no residual or recurrent tissue post thyroidectomy. She takes Synthroid 112 mcg daily. She has chronic swallowing issues post surgery.    # stage IA right breast cancer s/p partial mastectomy on 01/14/2020.  Pathology revealed a 7 mm grade 1 invasive mammary carcinoma (NOS, ductal).  There was a separate focus of DCIS and intraductal papilloma.  DCIS margins were 3 mm; invasive carcinoma margins were 6 mm.  One sentinel lymph node was negative.  Tumor was ER+ (>90%), PR+ (> 90%), and HER-2 negative (score 0).  Pathologic stage was pT1bpN0.  CA27.29 was 16.3 on 01/07/2020.  Oncotype DX testing revealed a recurrence score of 16 which  translated to a risk of distant recurrence at 9 years of 15% (95% CI 10-19%) and no benefit of chemotherapy.  She received radiation to the right breast from 02/25/2020 - 03/27/2020. She received 42.56 Gy in 16 fractions. She received a Boost of 10 Gy in 5 fractions.  05/11/2020.Started on Letrozole   # Osteopenia  Bone density on 07/05/2016 revealed osteopenia with a T-score of -1.5 in L1-L4 and -13 in the left femoral neck.  Bone density on 08/15/2018 revealed osteopenia in the left femur neck with a T-score of -1.1 in the left femoral neck.  Bone density on 04/02/2020 ([facility]) revealed osteopenia with a T score of -1.5 in the lumbar spine L1-L4.  She continued calcium and vitamin D.  She began Prolia on 05/06/2020.   INTERVAL HISTORY Tanya Frey is a 75 y.o. female who has above history reviewed by me today presents for follow up visit for history of breast cancer, osteopenia, and history of thyroid cancer.  Problems and complaints are listed below:  She started on Letrozole in April 2022 and overall tolerated well. No hot flash, body aches, joint pain. She takes Synthroid 112 mcg daily. Dr.[name] has referred her to establish care with endocrinology Dr. [name] to manage her history of thyroid cancer.  No new complaints today.  She lost 2 pounds since last visit.  Accompanied by husband.   Review of Systems - Oncology   Past Medical History:  Diagnosis Date   Anemia    Breast cancer in female [facility]) 12/27/2019   Right breast   Difficult intubation    Diffuse cystic mastopathy    Family history of malignant neoplasm of  breast    Hyperlipidemia    Obesity, unspecified    Personal history of tobacco use, presenting hazards to health    Thyroid cancer ([place])    T4a, NX: 4.6 cm lesion with extrathyroidal extension. Received I 131 post procedure.     Varicose veins     Past Surgical History:  Procedure Laterality Date   BREAST LUMPECTOMY,RADIO FREQ LOCALIZER,AXILLARY SENTINEL  LYMPH NODE BIOPSY Right 01/14/2020   Procedure: BREAST LUMPECTOMY,RADIO FREQ LOCALIZER,AXILLARY SENTINEL LYMPH NODE BIOPSY;  Surgeon: Jules Husbands, MD;  Location: [facility] ORS;  Service: General;  Laterality: Right;   BREAST SURGERY     COLONOSCOPY  2011   Dr. [name]; colon polyps (tubular adenoma)   COLONOSCOPY N/A    COLONOSCOPY WITH PROPOFOL N/A 10/10/2014   Procedure: COLONOSCOPY WITH PROPOFOL;  Surgeon: [name], MD;  Location: [place];  Service: Endoscopy;  Laterality: N/A;   COLONOSCOPY WITH PROPOFOL N/A 08/05/2020   Procedure: COLONOSCOPY WITH PROPOFOL;  Surgeon: Robert Bellow, MD;  Location: [facility] ENDOSCOPY;  Service: Endoscopy;  Laterality: N/A;   FRACTURE SURGERY     ORIF ANKLE FRACTURE Right 03/16/2020   Procedure: OPEN REDUCTION INTERNAL FIXATION (ORIF) ANKLE FRACTURE;  Surgeon: [name], MD;  Location: [facility] ORS;  Service: Orthopedics;  Laterality: Right;   THYROID SURGERY Bilateral    THYROIDECTOMY N/A 02/21/2017   Procedure: THYROIDECTOMY;  Surgeon: [name], MD;  Location: [facility] ORS;  Service: ENT;  Laterality: N/A;   TUBAL LIGATION     VARICOSE VEIN SURGERY     vein closure procedure Right 2009    Family History  Problem Relation Age of Onset   Breast cancer Daughter 6       [place] BRCA negative    Social History:  reports that she quit smoking about 30 years ago. Her smoking use included cigarettes. She has a 10.00 pack-year smoking history. She has never used smokeless tobacco. She reports current alcohol use of about 1.0 - 4.0 standard drink of alcohol per week. She reports that she does not use drugs.  She is retired from [employer]. She [place] in [place].  The patient is accompanied by her husband today.  Allergies:  Allergies  Allergen Reactions   Cefdinir Rash    Pt tolerated exposure to cefazolin   Protonix [Pantoprazole Sodium] Rash    Current Medications: Current Outpatient Medications  Medication Sig  Dispense Refill   aspirin EC 81 MG tablet Take 81 mg by mouth daily. Swallow whole.     Calcium-Magnesium-Vitamin D (CALCIUM 1200+D3 PO) Take 1,200 mg by mouth daily.      cyanocobalamin 100 MCG tablet Take 100 mcg by mouth daily.     letrozole (FEMARA) 2.5 MG tablet Take 1 tablet (2.5 mg total) by mouth daily. 30 tablet 0   levothyroxine (SYNTHROID) 112 MCG tablet TAKE 1 TABLET(112 MCG) BY MOUTH DAILY BEFORE AND BREAKFAST 30 tablet 2   Multiple Vitamins-Minerals (MULTIVITAMIN WITH MINERALS) tablet Take 1 tablet by mouth daily.     simvastatin (ZOCOR) 40 MG tablet Take 40 mg by mouth every evening.      No current facility-administered medications for this visit.      Performance status (ECOG):  1  Vitals: Blood pressure (!) 132/58, pulse 70, temperature (!) 96.2 F (35.7 C), resp. rate 20, weight 150 lb 12.7 oz (68.4 kg), SpO2 100 %.   Physical Exam Vitals reviewed.  Constitutional:      General: She is not in acute distress.  Appearance: She is well-developed. She is not diaphoretic.  HENT:     Head: Normocephalic and atraumatic.     Mouth/Throat:     Mouth: Mucous membranes are moist.     Pharynx: Oropharynx is clear. No oropharyngeal exudate.  Eyes:     General: No scleral icterus.    Extraocular Movements: Extraocular movements intact.     Conjunctiva/sclera: Conjunctivae normal.     Pupils: Pupils are equal, round, and reactive to light.  Neck:     Vascular: No JVD.  Cardiovascular:     Rate and Rhythm: Normal rate and regular rhythm.     Heart sounds: Normal heart sounds. No murmur heard. Pulmonary:     Effort: Pulmonary effort is normal. No respiratory distress.     Breath sounds: Normal breath sounds. No wheezing or rales.  Chest:     Chest wall: No tenderness.  Breasts:    Right: Skin change (radiation changes) present. No swelling, bleeding, mass, tenderness or supraclavicular adenopathy.     Left: Skin change (fibrocystic changes superiorly) present. No  swelling, bleeding, mass, tenderness or supraclavicular adenopathy.  Abdominal:     General: Bowel sounds are normal. There is no distension.     Palpations: Abdomen is soft. There is no mass.     Tenderness: There is no abdominal tenderness. There is no guarding or rebound.  Musculoskeletal:        General: No tenderness. Normal range of motion.     Cervical back: Normal range of motion and neck supple.  Lymphadenopathy:     Head:     Right side of head: No preauricular, posterior auricular or occipital adenopathy.     Left side of head: No preauricular, posterior auricular or occipital adenopathy.     Cervical: No cervical adenopathy.     Upper Body:     Right upper body: No supraclavicular adenopathy.     Left upper body: No supraclavicular adenopathy.     Lower Body: No right inguinal adenopathy. No left inguinal adenopathy.  Skin:    General: Skin is warm and dry.     Coloration: Skin is not pale.     Findings: No erythema or rash.  Neurological:     Mental Status: She is alert and oriented to person, place, and time.  Psychiatric:        Behavior: Behavior normal.        Thought Content: Thought content normal.        Judgment: Judgment normal.   Appointment on 08/27/2020  Component Date Value Ref Range Status   WBC 08/27/2020 4.6  4.0 - 10.5 K/uL Final   RBC 08/27/2020 4.64  3.87 - 5.11 MIL/uL Final   Hemoglobin 08/27/2020 12.8  12.0 - 15.0 g/dL Final   HCT 08/27/2020 38.9  36.0 - 46.0 % Final   MCV 08/27/2020 83.8  80.0 - 100.0 fL Final   MCH 08/27/2020 27.6  26.0 - 34.0 pg Final   MCHC 08/27/2020 32.9  30.0 - 36.0 g/dL Final   RDW 08/27/2020 16.1 (A) 11.5 - 15.5 % Final   Platelets 08/27/2020 213  150 - 400 K/uL Final   nRBC 08/27/2020 0.0  0.0 - 0.2 % Final   Neutrophils Relative % 08/27/2020 53  % Final   Neutro Abs 08/27/2020 2.4  1.7 - 7.7 K/uL Final   Lymphocytes Relative 08/27/2020 36  % Final   Lymphs Abs 08/27/2020 1.7  0.7 - 4.0 K/uL Final   Monocytes  Relative 08/27/2020  8  % Final   Monocytes Absolute 08/27/2020 0.4  0.1 - 1.0 K/uL Final   Eosinophils Relative 08/27/2020 2  % Final   Eosinophils Absolute 08/27/2020 0.1  0.0 - 0.5 K/uL Final   Basophils Relative 08/27/2020 1  % Final   Basophils Absolute 08/27/2020 0.1  0.0 - 0.1 K/uL Final   Immature Granulocytes 08/27/2020 0  % Final   Abs Immature Granulocytes 08/27/2020 0.01  0.00 - 0.07 K/uL Final   Performed at [facility], [address] [place], [place] [address]   Sodium 08/27/2020 134 (A) 135 - 145 mmol/L Final   Potassium 08/27/2020 4.4  3.5 - 5.1 mmol/L Final   Chloride 08/27/2020 101  98 - 111 mmol/L Final   CO2 08/27/2020 28  22 - 32 mmol/L Final   Glucose, Bld 08/27/2020 104 (A) 70 - 99 mg/dL Final   Glucose reference range applies only to samples taken after fasting for at least 8 hours.   BUN 08/27/2020 13  8 - 23 mg/dL Final   Creatinine, Ser 08/27/2020 0.65  0.44 - 1.00 mg/dL Final   Calcium 08/27/2020 9.3  8.9 - 10.3 mg/dL Final   Total Protein 08/27/2020 7.1  6.5 - 8.1 g/dL Final   Albumin 08/27/2020 4.1  3.5 - 5.0 g/dL Final   AST 08/27/2020 19  15 - 41 U/L Final   ALT 08/27/2020 16  0 - 44 U/L Final   Alkaline Phosphatase 08/27/2020 34 (A) 38 - 126 U/L Final   Total Bilirubin 08/27/2020 0.6  0.3 - 1.2 mg/dL Final   GFR, Estimated 08/27/2020 >60  >60 mL/min Final   Comment: (NOTE) Calculated using the CKD-EPI Creatinine Equation (2021)    Anion gap 08/27/2020 5  5 - 15 Final   Performed at [facility], [address], [place],  [address]    Assessment and plan 1. Malignant neoplasm of upper-outer quadrant of right breast in female, estrogen receptor positive ([place])   2. Thyroid cancer ([place])   3. Osteopenia of spine   4. Aromatase inhibitor use   Cancer Staging Malignant neoplasm of female breast [facility]) Staging form: Breast, AJCC 8th Edition - Clinical stage from 01/14/2020: Stage IA (cT1b, cN0(sn), cM0, G1, ER+, PR+,  HER2-) - Signed by Lequita Asal, MD on 04/05/2020   # Stage IA right breast cancer, s/p partial mastectomy on 01/14/2020, ER/PR+ and Her2/neu-. Oncotype DX testing revealed a recurrence score of 16 no benefit of chemotherapy-s/p radiation-Letrozole since 05/11/2020.   Clinically she is doing well.  Continue Letrozole.    #Osteopenia  Bone density [[facility]]  on 04/02/2020 revealed progressive osteopenia with a T score of -1.5 in the lumbar spine L1-L4. Continue calcium and vitamin D. Prolia Q25msince 05/06/2020, next due in Sept 2022. Will arrange.   # Stage III thyroid carcinoma, s/p thyroidectomy 02/2017.s/p I-131. Thyroid ultrasound on 08/06/2018 revealed no residual disease. Thyroglobulin was <0.1, thyroglobulin antibody was <1.0.  TSH was 0.379 Synthroid 112 mcg/day. To establish care with Dr.Solum   RTC in lab Prolia around 9/30 3 months for MD assessment and labs (CBC with diff, CMP).  I discussed the assessment and treatment plan with the patient.  The patient was provided an opportunity to ask questions and all were answered.  The patient agreed with the plan and demonstrated an understanding of the instructions.  The patient was advised to call back if the symptoms worsen or if the condition fails to improve as anticipated.  Earlie Server, MD, PhD Hematology Oncology [place] at [facility] 08/27/2020

## 2020-08-27 NOTE — Progress Notes (Signed)
Patient states no new concerns. 

## 2020-08-28 LAB — CANCER ANTIGEN 27.29: CA 27.29: 25.2 U/mL (ref 0.0–38.6)

## 2020-08-31 ENCOUNTER — Other Ambulatory Visit: Payer: Self-pay | Admitting: *Deleted

## 2020-08-31 DIAGNOSIS — Z170 Estrogen receptor positive status [ER+]: Secondary | ICD-10-CM

## 2020-08-31 DIAGNOSIS — C50411 Malignant neoplasm of upper-outer quadrant of right female breast: Secondary | ICD-10-CM

## 2020-08-31 MED ORDER — LETROZOLE 2.5 MG PO TABS: 2.5000 mg | ORAL_TABLET | Freq: Every day | ORAL | 0 refills | Status: DC

## 2020-09-14 ENCOUNTER — Other Ambulatory Visit: Payer: Self-pay | Admitting: Oncology

## 2020-09-14 DIAGNOSIS — C73 Malignant neoplasm of thyroid gland: Secondary | ICD-10-CM

## 2020-09-16 DIAGNOSIS — Z85850 Personal history of malignant neoplasm of thyroid: Secondary | ICD-10-CM | POA: Diagnosis not present

## 2020-09-16 DIAGNOSIS — E890 Postprocedural hypothyroidism: Secondary | ICD-10-CM | POA: Diagnosis not present

## 2020-10-01 ENCOUNTER — Ambulatory Visit
Admission: RE | Admit: 2020-10-01 | Discharge: 2020-10-01 | Disposition: A | Discharge status: Home / Self Care | Payer: Medicare HMO | Source: Ambulatory Visit | Attending: Radiation Oncology | Admitting: Radiation Oncology

## 2020-10-01 ENCOUNTER — Encounter: Payer: Self-pay | Admitting: Radiation Oncology

## 2020-10-01 ENCOUNTER — Other Ambulatory Visit: Payer: Self-pay

## 2020-10-01 DIAGNOSIS — Z853 Personal history of malignant neoplasm of breast: Secondary | ICD-10-CM | POA: Diagnosis not present

## 2020-10-01 DIAGNOSIS — C50411 Malignant neoplasm of upper-outer quadrant of right female breast: Secondary | ICD-10-CM | POA: Insufficient documentation

## 2020-10-01 DIAGNOSIS — Z79811 Long term (current) use of aromatase inhibitors: Secondary | ICD-10-CM | POA: Insufficient documentation

## 2020-10-01 DIAGNOSIS — Z170 Estrogen receptor positive status [ER+]: Secondary | ICD-10-CM

## 2020-10-01 DIAGNOSIS — Z923 Personal history of irradiation: Secondary | ICD-10-CM | POA: Insufficient documentation

## 2020-10-01 DIAGNOSIS — Z08 Encounter for follow-up examination after completed treatment for malignant neoplasm: Secondary | ICD-10-CM | POA: Diagnosis not present

## 2020-10-01 NOTE — Progress Notes (Signed)
Radiation Oncology Follow up Note  Name: Tanya Frey   Date:   10/01/2020 MRN:  [id number] DOB: 02-Feb-1946    This 75 y.o. female presents to the clinic today for 106-monthfollow-up status post whole breast radiation to her right breast for stage I ER/PR positive base of mammary carcinoma.  REFERRING PROVIDER: JBaxter Hire MD  HPI: Patient is a 75year old female now out 6 months having completed whole breast radiation to her right breast for stage I ER/PR positive invasive mammary carcinoma.  Seen today in routine follow-up she is doing well she specifically denies breast tenderness cough or bone pain..  She is currently on Femara tolerant well without side effects.  She has not yet had a current mammogram.  COMPLICATIONS OF TREATMENT: none  FOLLOW UP COMPLIANCE: keeps appointments   PHYSICAL EXAM:  BP (!) (P) 144/78 (BP Location: Left Arm, Patient Position: Sitting)   Pulse (P) 72   Temp (!) (P) 97 F (36.1 C) (Tympanic)   Resp (P) 16   Wt (P) 151 lb 6.4 oz (68.7 kg)   BMI (P) 25.19 kg/m  Lungs are clear to A&P cardiac examination essentially unremarkable with regular rate and rhythm. No dominant mass or nodularity is noted in either breast in 2 positions examined. Incision is well-healed. No axillary or supraclavicular adenopathy is appreciated. Cosmetic result is excellent.  Well-developed well-nourished patient in NAD. HEENT reveals PERLA, EOMI, discs not visualized.  Oral cavity is clear. No oral mucosal lesions are identified. Neck is clear without evidence of cervical or supraclavicular adenopathy. Lungs are clear to A&P. Cardiac examination is essentially unremarkable with regular rate and rhythm without murmur rub or thrill. Abdomen is benign with no organomegaly or masses noted. Motor sensory and DTR levels are equal and symmetric in the upper and lower extremities. Cranial nerves II through XII are grossly intact. Proprioception is intact. No peripheral adenopathy or  edema is identified. No motor or sensory levels are noted. Crude visual fields are within normal range.  RADIOLOGY RESULTS: No current films for review  PLAN: Present time patient is doing well 6 months out with no evidence of disease.  And pleased with her overall progress.  I have asked to see her back in 6 months for follow-up at which time anticipate she will have a had a mammogram ordered.  She continues on Femara without side effect.  Patient knows to call with any concerns.  I would like to take this opportunity to thank you for allowing me to participate in the care of your patient..Noreene Filbert MD

## 2020-10-13 ENCOUNTER — Other Ambulatory Visit: Payer: Self-pay

## 2020-10-13 DIAGNOSIS — Z170 Estrogen receptor positive status [ER+]: Secondary | ICD-10-CM

## 2020-10-13 DIAGNOSIS — C50411 Malignant neoplasm of upper-outer quadrant of right female breast: Secondary | ICD-10-CM

## 2020-10-14 DIAGNOSIS — E890 Postprocedural hypothyroidism: Secondary | ICD-10-CM | POA: Diagnosis not present

## 2020-10-14 DIAGNOSIS — Z85850 Personal history of malignant neoplasm of thyroid: Secondary | ICD-10-CM | POA: Diagnosis not present

## 2020-11-06 ENCOUNTER — Inpatient Hospital Stay: Payer: Medicare HMO

## 2020-11-06 ENCOUNTER — Other Ambulatory Visit: Payer: Self-pay

## 2020-11-06 ENCOUNTER — Inpatient Hospital Stay: Payer: Medicare HMO | Attending: Nurse Practitioner

## 2020-11-06 VITALS — BP 119/75 | HR 74 | Temp 98.2°F | Resp 18

## 2020-11-06 DIAGNOSIS — Z79899 Other long term (current) drug therapy: Secondary | ICD-10-CM | POA: Insufficient documentation

## 2020-11-06 DIAGNOSIS — Z7982 Long term (current) use of aspirin: Secondary | ICD-10-CM | POA: Diagnosis not present

## 2020-11-06 DIAGNOSIS — Z888 Allergy status to other drugs, medicaments and biological substances status: Secondary | ICD-10-CM | POA: Insufficient documentation

## 2020-11-06 DIAGNOSIS — Z87891 Personal history of nicotine dependence: Secondary | ICD-10-CM | POA: Diagnosis not present

## 2020-11-06 DIAGNOSIS — C50411 Malignant neoplasm of upper-outer quadrant of right female breast: Secondary | ICD-10-CM

## 2020-11-06 DIAGNOSIS — Z86010 Personal history of colonic polyps: Secondary | ICD-10-CM | POA: Diagnosis not present

## 2020-11-06 DIAGNOSIS — Z170 Estrogen receptor positive status [ER+]: Secondary | ICD-10-CM | POA: Insufficient documentation

## 2020-11-06 DIAGNOSIS — Z881 Allergy status to other antibiotic agents status: Secondary | ICD-10-CM | POA: Insufficient documentation

## 2020-11-06 DIAGNOSIS — Z79811 Long term (current) use of aromatase inhibitors: Secondary | ICD-10-CM | POA: Diagnosis not present

## 2020-11-06 DIAGNOSIS — M8580 Other specified disorders of bone density and structure, unspecified site: Secondary | ICD-10-CM | POA: Diagnosis not present

## 2020-11-06 DIAGNOSIS — M8588 Other specified disorders of bone density and structure, other site: Secondary | ICD-10-CM

## 2020-11-06 DIAGNOSIS — Z923 Personal history of irradiation: Secondary | ICD-10-CM | POA: Insufficient documentation

## 2020-11-06 LAB — BASIC METABOLIC PANEL
Anion gap: 7 (ref 5–15)
BUN: 12 mg/dL (ref 8–23)
CO2: 29 mmol/L (ref 22–32)
Calcium: 9.2 mg/dL (ref 8.9–10.3)
Chloride: 96 mmol/L — ABNORMAL LOW (ref 98–111)
Creatinine, Ser: 0.69 mg/dL (ref 0.44–1.00)
GFR, Estimated: >60 mL/min (ref >60)
Glucose, Bld: 86 mg/dL (ref 70–99)
Potassium: 4 mmol/L (ref 3.5–5.1)
Sodium: 132 mmol/L — ABNORMAL LOW (ref 135–145)

## 2020-11-06 MED ORDER — DENOSUMAB 60 MG/ML ~~LOC~~ SOSY
60.0000 mg | PREFILLED_SYRINGE | Freq: Once | SUBCUTANEOUS | Status: AC
  Administered 2020-11-06: 60 mg via SUBCUTANEOUS

## 2020-11-09 ENCOUNTER — Encounter: Payer: Self-pay | Admitting: Oncology

## 2020-11-10 NOTE — Telephone Encounter (Signed)
Please advise 

## 2020-11-17 ENCOUNTER — Other Ambulatory Visit: Payer: Self-pay

## 2020-11-17 ENCOUNTER — Inpatient Hospital Stay: Payer: Medicare HMO | Attending: Oncology

## 2020-11-17 DIAGNOSIS — E871 Hypo-osmolality and hyponatremia: Secondary | ICD-10-CM | POA: Diagnosis not present

## 2020-11-17 DIAGNOSIS — Z79811 Long term (current) use of aromatase inhibitors: Secondary | ICD-10-CM | POA: Diagnosis not present

## 2020-11-17 DIAGNOSIS — Z170 Estrogen receptor positive status [ER+]: Secondary | ICD-10-CM | POA: Insufficient documentation

## 2020-11-17 DIAGNOSIS — Z79899 Other long term (current) drug therapy: Secondary | ICD-10-CM | POA: Diagnosis not present

## 2020-11-17 DIAGNOSIS — C50411 Malignant neoplasm of upper-outer quadrant of right female breast: Secondary | ICD-10-CM | POA: Diagnosis not present

## 2020-11-17 DIAGNOSIS — M8580 Other specified disorders of bone density and structure, unspecified site: Secondary | ICD-10-CM | POA: Diagnosis not present

## 2020-11-17 DIAGNOSIS — Z85850 Personal history of malignant neoplasm of thyroid: Secondary | ICD-10-CM | POA: Insufficient documentation

## 2020-11-17 LAB — COMPREHENSIVE METABOLIC PANEL
ALT: 17 U/L (ref 0–44)
AST: 19 U/L (ref 15–41)
Albumin: 4 g/dL (ref 3.5–5.0)
Alkaline Phosphatase: 38 U/L (ref 38–126)
Anion gap: 6 (ref 5–15)
BUN: 17 mg/dL (ref 8–23)
CO2: 25 mmol/L (ref 22–32)
Calcium: 8.8 mg/dL — ABNORMAL LOW (ref 8.9–10.3)
Chloride: 101 mmol/L (ref 98–111)
Creatinine, Ser: 0.73 mg/dL (ref 0.44–1.00)
GFR, Estimated: >60 mL/min (ref >60)
Glucose, Bld: 97 mg/dL (ref 70–99)
Potassium: 4.3 mmol/L (ref 3.5–5.1)
Sodium: 132 mmol/L — ABNORMAL LOW (ref 135–145)
Total Bilirubin: 0.2 mg/dL — ABNORMAL LOW (ref 0.3–1.2)
Total Protein: 7.1 g/dL (ref 6.5–8.1)

## 2020-11-17 LAB — CBC WITH DIFFERENTIAL/PLATELET
Abs Immature Granulocytes: 0.02 10*3/uL (ref 0.00–0.07)
Basophils Absolute: 0.1 10*3/uL (ref 0.0–0.1)
Basophils Relative: 1 %
Eosinophils Absolute: 0.1 10*3/uL (ref 0.0–0.5)
Eosinophils Relative: 1 %
HCT: 38.2 % (ref 36.0–46.0)
Hemoglobin: 13 g/dL (ref 12.0–15.0)
Immature Granulocytes: 0 %
Lymphocytes Relative: 35 %
Lymphs Abs: 1.9 10*3/uL (ref 0.7–4.0)
MCH: 29.1 pg (ref 26.0–34.0)
MCHC: 34 g/dL (ref 30.0–36.0)
MCV: 85.5 fL (ref 80.0–100.0)
Monocytes Absolute: 0.5 10*3/uL (ref 0.1–1.0)
Monocytes Relative: 8 %
Neutro Abs: 3 10*3/uL (ref 1.7–7.7)
Neutrophils Relative %: 55 %
Platelets: 215 10*3/uL (ref 150–400)
RBC: 4.47 MIL/uL (ref 3.87–5.11)
RDW: 15.1 % (ref 11.5–15.5)
WBC: 5.5 10*3/uL (ref 4.0–10.5)
nRBC: 0 % (ref 0.0–0.2)

## 2020-11-19 ENCOUNTER — Inpatient Hospital Stay: Payer: Medicare HMO | Attending: Oncology | Admitting: Oncology

## 2020-11-19 ENCOUNTER — Ambulatory Visit: Payer: Medicare HMO | Admitting: Oncology

## 2020-11-19 ENCOUNTER — Encounter: Payer: Self-pay | Admitting: Oncology

## 2020-11-19 ENCOUNTER — Other Ambulatory Visit: Payer: Self-pay

## 2020-11-19 VITALS — BP 134/73 | HR 75 | Temp 97.2°F | Resp 18 | Wt 154.0 lb

## 2020-11-19 DIAGNOSIS — E871 Hypo-osmolality and hyponatremia: Secondary | ICD-10-CM | POA: Diagnosis not present

## 2020-11-19 DIAGNOSIS — M8588 Other specified disorders of bone density and structure, other site: Secondary | ICD-10-CM | POA: Diagnosis not present

## 2020-11-19 DIAGNOSIS — Z79811 Long term (current) use of aromatase inhibitors: Secondary | ICD-10-CM

## 2020-11-19 DIAGNOSIS — M8580 Other specified disorders of bone density and structure, unspecified site: Secondary | ICD-10-CM | POA: Diagnosis not present

## 2020-11-19 DIAGNOSIS — Z7982 Long term (current) use of aspirin: Secondary | ICD-10-CM | POA: Insufficient documentation

## 2020-11-19 DIAGNOSIS — R413 Other amnesia: Secondary | ICD-10-CM | POA: Diagnosis not present

## 2020-11-19 DIAGNOSIS — Z9011 Acquired absence of right breast and nipple: Secondary | ICD-10-CM | POA: Insufficient documentation

## 2020-11-19 DIAGNOSIS — Z170 Estrogen receptor positive status [ER+]: Secondary | ICD-10-CM

## 2020-11-19 DIAGNOSIS — Z87891 Personal history of nicotine dependence: Secondary | ICD-10-CM | POA: Diagnosis not present

## 2020-11-19 DIAGNOSIS — C50411 Malignant neoplasm of upper-outer quadrant of right female breast: Secondary | ICD-10-CM

## 2020-11-19 DIAGNOSIS — C73 Malignant neoplasm of thyroid gland: Secondary | ICD-10-CM | POA: Diagnosis not present

## 2020-11-19 DIAGNOSIS — Z85850 Personal history of malignant neoplasm of thyroid: Secondary | ICD-10-CM | POA: Diagnosis not present

## 2020-11-19 DIAGNOSIS — Z79899 Other long term (current) drug therapy: Secondary | ICD-10-CM | POA: Diagnosis not present

## 2020-11-19 NOTE — Progress Notes (Signed)
Pt husband has concerns regarding low sodium, calcium and bilirubin per mychart results. No other concerns at this time.

## 2020-11-19 NOTE — Progress Notes (Signed)
Hematology Oncology Progress Note   Clinic Day:  11/19/2020  Referring physician: [name], MD  Chief Complaint: Tanya Frey is a 75 y.o. female presents for stage III papillary carcinoma thyroid and stage IA right breast cancer   PERTINENT ONCOLOGY HISTORY Patient previously followed up by Dr.[name], patient switched care to me on 08/27/20 Extensive medical record review was performed by me   # Stage III papillary carcinoma thyroid carcinoma s/p thyroidectomy in 02/21/2017.  Pathology revealed a 4.6 cm papillary thyroid carcinoma with extrathyroidal extension.  There was angioinvasion but no lymphatic invasion.  Margins were negative.  Pathologic stage was pT4a Nx (stage III).  Soft tissue neck CT on 01/11/2017 revealed a 5 cm complex cystic and solid mass arising from the right lobe of the thyroid compatible with known carcinoma. There was no malignant adenopathy.She received 130.6 mCi I-131 with Thyrogen stimulation on 04/26/2017.  Whole body I-131 scan on 05/04/2017 revealed uptake at thyroid bed consistent with thyroid remnant.  There was no scintigraphic evidence of iodine-avid metastatic thyroid cancer.  Thyroid ultrasound on 08/06/2018 revealed no residual or recurrent tissue post thyroidectomy. She takes Synthroid 112 mcg daily. She has chronic swallowing issues post surgery.    # stage IA right breast cancer s/p partial mastectomy on 01/14/2020.  Pathology revealed a 7 mm grade 1 invasive mammary carcinoma (NOS, ductal).  There was a separate focus of DCIS and intraductal papilloma.  DCIS margins were 3 mm; invasive carcinoma margins were 6 mm.  One sentinel lymph node was negative.  Tumor was ER+ (>90%), PR+ (> 90%), and HER-2 negative (score 0).  Pathologic stage was pT1bpN0.  CA27.29 was 16.3 on 01/07/2020.  Oncotype DX testing revealed a recurrence score of 16 which translated to a risk of distant recurrence at 9 years of 15% (95% CI 10-19%) and no benefit of  chemotherapy.  She received radiation to the right breast from 02/25/2020 - 03/27/2020. She received 42.56 Gy in 16 fractions. She received a Boost of 10 Gy in 5 fractions.  05/11/2020.Started on Letrozole   # Osteopenia  Bone density on 07/05/2016 revealed osteopenia with a T-score of -1.5 in L1-L4 and -13 in the left femoral neck.  Bone density on 08/15/2018 revealed osteopenia in the left femur neck with a T-score of -1.1 in the left femoral neck.  Bone density on 04/02/2020 ([facility]) revealed osteopenia with a T score of -1.5 in the lumbar spine L1-L4.  She continued calcium and vitamin D.  She began Prolia on 05/06/2020.   INTERVAL HISTORY Tanya Frey is a 75 y.o. female who has above history reviewed by me today presents for follow up visit for history of breast cancer, osteopenia, and history of thyroid cancer.  Problems and complaints are listed below: Patient was accompanied by husband. Patient has question about her lab work.  Patient has been more forgetful and confused lately.  She is on Letrozole and tolerates well.   Review of Systems  Constitutional:  Negative for appetite change, chills, fatigue and fever.  HENT:   Negative for hearing loss and voice change.   Eyes:  Negative for eye problems.  Respiratory:  Negative for chest tightness and cough.   Cardiovascular:  Negative for chest pain.  Gastrointestinal:  Negative for abdominal distention, abdominal pain and blood in stool.  Endocrine: Negative for hot flashes.  Genitourinary:  Negative for difficulty urinating and frequency.   Musculoskeletal:  Negative for arthralgias.  Skin:  Negative for itching and rash.  Neurological:  Negative for extremity weakness.  Hematological:  Negative for adenopathy.  Psychiatric/Behavioral:  Positive for confusion.        Memory loss    Past Medical History:  Diagnosis Date   Anemia    Breast cancer in female [facility]) 12/27/2019   Right breast   Difficult intubation     Diffuse cystic mastopathy    Family history of malignant neoplasm of breast    Hyperlipidemia    Obesity, unspecified    Personal history of tobacco use, presenting hazards to health    Thyroid cancer ([place])    T4a, NX: 4.6 cm lesion with extrathyroidal extension. Received I 131 post procedure.     Varicose veins     Past Surgical History:  Procedure Laterality Date   BREAST LUMPECTOMY,RADIO FREQ LOCALIZER,AXILLARY SENTINEL LYMPH NODE BIOPSY Right 01/14/2020   Procedure: BREAST LUMPECTOMY,RADIO FREQ LOCALIZER,AXILLARY SENTINEL LYMPH NODE BIOPSY;  Surgeon: Jules Husbands, MD;  Location: [facility];  Service: General;  Laterality: Right;   BREAST SURGERY     COLONOSCOPY  2011   Dr. [name]; colon polyps (tubular adenoma)   COLONOSCOPY N/A    COLONOSCOPY WITH PROPOFOL N/A 10/10/2014   Procedure: COLONOSCOPY WITH PROPOFOL;  Surgeon: [name], MD;  Location: [place];  Service: Endoscopy;  Laterality: N/A;   COLONOSCOPY WITH PROPOFOL N/A 08/05/2020   Procedure: COLONOSCOPY WITH PROPOFOL;  Surgeon: Robert Bellow, MD;  Location: [facility];  Service: Endoscopy;  Laterality: N/A;   FRACTURE SURGERY     ORIF ANKLE FRACTURE Right 03/16/2020   Procedure: OPEN REDUCTION INTERNAL FIXATION (ORIF) ANKLE FRACTURE;  Surgeon: [name], MD;  Location: [facility];  Service: Orthopedics;  Laterality: Right;   THYROID SURGERY Bilateral    THYROIDECTOMY N/A 02/21/2017   Procedure: THYROIDECTOMY;  Surgeon: [name], MD;  Location: [facility];  Service: ENT;  Laterality: N/A;   TUBAL LIGATION     VARICOSE VEIN SURGERY     vein closure procedure Right 2009    Family History  Problem Relation Age of Onset   Breast cancer Daughter 73       [name] BRCA negative    Social History:  reports that she quit smoking about 30 years ago. Her smoking use included cigarettes. She has a 10.00 pack-year smoking history. She has never used smokeless tobacco. She reports current  alcohol use of about 1.0 - 4.0 standard drink per week. She reports that she does not use drugs.  She is retired from [employer]. She lives in [place].  The patient is accompanied by her husband today.  Allergies:  Allergies  Allergen Reactions   Cefdinir Rash    Pt tolerated exposure to cefazolin   Protonix [Pantoprazole Sodium] Rash    Current Medications: Current Outpatient Medications  Medication Sig Dispense Refill   aspirin EC 81 MG tablet Take 81 mg by mouth daily. Swallow whole.     Calcium-Magnesium-Vitamin D (CALCIUM 1200+D3 PO) Take 1,200 mg by mouth daily.      letrozole (FEMARA) 2.5 MG tablet Take 1 tablet (2.5 mg total) by mouth daily. 90 tablet 0   levothyroxine (SYNTHROID) 112 MCG tablet TAKE 1 TABLET(112 MCG) BY MOUTH DAILY BEFORE AND BREAKFAST 30 tablet 2   Multiple Vitamins-Minerals (MULTIVITAMIN WITH MINERALS) tablet Take 1 tablet by mouth daily.     simvastatin (ZOCOR) 40 MG tablet Take 40 mg by mouth every evening.      vitamin B-12 (CYANOCOBALAMIN) 1000 MCG tablet Take 1,000 mcg  by mouth daily.     No current facility-administered medications for this visit.      Performance status (ECOG):  1  Vitals: Blood pressure 134/73, pulse 75, temperature (!) 97.2 F (36.2 C), resp. rate 18, weight 154 lb (69.9 kg), SpO2 100 %.   Physical Exam Vitals reviewed.  Constitutional:      General: She is not in acute distress.    Appearance: She is well-developed. She is not diaphoretic.  HENT:     Head: Normocephalic and atraumatic.     Mouth/Throat:     Mouth: Mucous membranes are moist.     Pharynx: Oropharynx is clear. No oropharyngeal exudate.  Eyes:     General: No scleral icterus.    Extraocular Movements: Extraocular movements intact.     Conjunctiva/sclera: Conjunctivae normal.     Pupils: Pupils are equal, round, and reactive to light.  Neck:     Vascular: No JVD.  Cardiovascular:     Rate and Rhythm: Normal rate and regular rhythm.      Heart sounds: Normal heart sounds. No murmur heard. Pulmonary:     Effort: Pulmonary effort is normal. No respiratory distress.     Breath sounds: Normal breath sounds. No wheezing or rales.  Chest:     Chest wall: No tenderness.  Breasts:    Right: Skin change (radiation changes) present. No swelling, bleeding, mass or tenderness.     Left: Skin change (fibrocystic changes superiorly) present. No swelling, bleeding, mass or tenderness.  Abdominal:     General: Bowel sounds are normal. There is no distension.     Palpations: Abdomen is soft. There is no mass.     Tenderness: There is no abdominal tenderness. There is no guarding or rebound.  Musculoskeletal:        General: No tenderness. Normal range of motion.     Cervical back: Normal range of motion and neck supple.  Lymphadenopathy:     Head:     Right side of head: No preauricular, posterior auricular or occipital adenopathy.     Left side of head: No preauricular, posterior auricular or occipital adenopathy.     Cervical: No cervical adenopathy.     Upper Body:     Right upper body: No supraclavicular adenopathy.     Left upper body: No supraclavicular adenopathy.     Lower Body: No right inguinal adenopathy. No left inguinal adenopathy.  Skin:    General: Skin is warm and dry.     Coloration: Skin is not pale.     Findings: No erythema or rash.  Neurological:     Mental Status: She is alert and oriented to person, place, and time.  Psychiatric:        Behavior: Behavior normal.        Thought Content: Thought content normal.        Judgment: Judgment normal.   Appointment on 11/17/2020  Component Date Value Ref Range Status   Sodium 11/17/2020 132 (A) 135 - 145 mmol/L Final   Potassium 11/17/2020 4.3  3.5 - 5.1 mmol/L Final   Chloride 11/17/2020 101  98 - 111 mmol/L Final   CO2 11/17/2020 25  22 - 32 mmol/L Final   Glucose, Bld 11/17/2020 97  70 - 99 mg/dL Final   Glucose reference range applies only to samples  taken after fasting for at least 8 hours.   BUN 11/17/2020 17  8 - 23 mg/dL Final   Creatinine, Ser 11/17/2020 0.73  0.44 - 1.00 mg/dL Final   Calcium 11/17/2020 8.8 (A) 8.9 - 10.3 mg/dL Final   Total Protein 11/17/2020 7.1  6.5 - 8.1 g/dL Final   Albumin 11/17/2020 4.0  3.5 - 5.0 g/dL Final   AST 11/17/2020 19  15 - 41 U/L Final   ALT 11/17/2020 17  0 - 44 U/L Final   Alkaline Phosphatase 11/17/2020 38  38 - 126 U/L Final   Total Bilirubin 11/17/2020 0.2 (A) 0.3 - 1.2 mg/dL Final   GFR, Estimated 11/17/2020 >60  >60 mL/min Final   Comment: (NOTE) Calculated using the CKD-EPI Creatinine Equation (2021)    Anion gap 11/17/2020 6  5 - 15 Final   Performed at [facility], [address], [place], [place] [address]   WBC 11/17/2020 5.5  4.0 - 10.5 K/uL Final   RBC 11/17/2020 4.47  3.87 - 5.11 MIL/uL Final   Hemoglobin 11/17/2020 13.0  12.0 - 15.0 g/dL Final   HCT 11/17/2020 38.2  36.0 - 46.0 % Final   MCV 11/17/2020 85.5  80.0 - 100.0 fL Final   MCH 11/17/2020 29.1  26.0 - 34.0 pg Final   MCHC 11/17/2020 34.0  30.0 - 36.0 g/dL Final   RDW 11/17/2020 15.1  11.5 - 15.5 % Final   Platelets 11/17/2020 215  150 - 400 K/uL Final   nRBC 11/17/2020 0.0  0.0 - 0.2 % Final   Neutrophils Relative % 11/17/2020 55  % Final   Neutro Abs 11/17/2020 3.0  1.7 - 7.7 K/uL Final   Lymphocytes Relative 11/17/2020 35  % Final   Lymphs Abs 11/17/2020 1.9  0.7 - 4.0 K/uL Final   Monocytes Relative 11/17/2020 8  % Final   Monocytes Absolute 11/17/2020 0.5  0.1 - 1.0 K/uL Final   Eosinophils Relative 11/17/2020 1  % Final   Eosinophils Absolute 11/17/2020 0.1  0.0 - 0.5 K/uL Final   Basophils Relative 11/17/2020 1  % Final   Basophils Absolute 11/17/2020 0.1  0.0 - 0.1 K/uL Final   Immature Granulocytes 11/17/2020 0  % Final   Abs Immature Granulocytes 11/17/2020 0.02  0.00 - 0.07 K/uL Final   Performed at [facility], [address], [place], [place] [address]    Assessment  and plan 1. Thyroid cancer ([place])   2. Hyponatremia   3. Malignant neoplasm of upper-outer quadrant of right breast in female, estrogen receptor positive ([place])   4. Aromatase inhibitor use   5. Osteopenia of spine   6. Memory loss   Cancer Staging Malignant neoplasm of female breast [facility]) Staging form: Breast, AJCC 8th Edition - Clinical stage from 01/14/2020: Stage IA (cT1b, cN0(sn), cM0, G1, ER+, PR+, HER2-) - Signed by Lequita Asal, MD on 04/05/2020   # Stage IA right breast cancer, s/p partial mastectomy on 01/14/2020, ER/PR+ and Her2/neu-. Oncotype DX testing revealed a recurrence score of 16 no benefit of chemotherapy-s/p radiation-Letrozole since 05/11/2020.   Labs are reviewed and discussed with patient. Continue Letrozole.    #Osteopenia  Bone density [[facility]]  on 04/02/2020 revealed progressive osteopenia with a T score of -1.5 in the lumbar spine L1-L4. Continue calcium and vitamin D. Prolia Q54m last dose 11/06/2020, next due March 2022  # Stage III thyroid carcinoma, s/p thyroidectomy 02/2017.s/p I-131. Thyroid ultrasound on 08/06/2018 revealed no residual disease. Thyroglobulin was <0.1, thyroglobulin antibody was <1.0.  TSH was 0.379 at goal of <0.5.  Continue Synthroid 112 mcg/day. She has establish care with Dr.Solum  and I will defer management to her.   # Hyponatremia, very mild. Check serum osm # memory loss, I recommend patient to discuss with PCP and evaluate for dementia.    6 months for MD assessment and labs (CBC with diff, CMP).  I discussed the assessment and treatment plan with the patient.  The patient was provided an opportunity to ask questions and all were answered.  The patient agreed with the plan and demonstrated an understanding of the instructions.  The patient was advised to call back if the symptoms worsen or if the condition fails to improve as anticipated.    Earlie Server, MD, PhD Hematology Oncology [place] at [facility] 11/19/2020

## 2020-11-20 ENCOUNTER — Telehealth: Payer: Self-pay

## 2020-11-20 NOTE — Telephone Encounter (Signed)
-----   Message from Earlie Server, MD sent at 11/19/2020  9:01 PM EDT ----- Please add [email or web address] when she comes back to see me thanks.

## 2020-11-20 NOTE — Telephone Encounter (Signed)
Please add xgeva (new) when she comes back to see MD..

## 2020-11-23 ENCOUNTER — Other Ambulatory Visit: Payer: Self-pay | Admitting: Oncology

## 2020-11-23 DIAGNOSIS — Z170 Estrogen receptor positive status [ER+]: Secondary | ICD-10-CM

## 2020-11-23 DIAGNOSIS — C50411 Malignant neoplasm of upper-outer quadrant of right female breast: Secondary | ICD-10-CM

## 2020-11-23 DIAGNOSIS — C73 Malignant neoplasm of thyroid gland: Secondary | ICD-10-CM

## 2020-11-23 NOTE — Telephone Encounter (Signed)
Thyroid Stimulating-Hormone (TSH) Order: [id number]  Ref Range & Units 2 mo ago  Thyroid Stimulating Hormone (TSH) 0.450-5.330 uIU/ml uIU/mL 0.330 Low    Comment: Reference Range for Pregnant Females >= 73 yrs old:  Normal Range for 1st trimester: 0.05-3.70 ulU/ml  Normal Range for 2nd trimester: 0.31-4.35 ulU/ml  Resulting Agency  [facility] - LAB  Specimen Collected: 09/16/20 14:15 Last Resulted: 09/16/20 17:09

## 2020-12-04 ENCOUNTER — Ambulatory Visit
Admission: RE | Admit: 2020-12-04 | Discharge: 2020-12-04 | Disposition: A | Discharge status: Home / Self Care | Payer: Medicare HMO | Source: Ambulatory Visit | Attending: Surgery | Admitting: Surgery

## 2020-12-04 ENCOUNTER — Other Ambulatory Visit: Payer: Self-pay

## 2020-12-04 DIAGNOSIS — Z170 Estrogen receptor positive status [ER+]: Secondary | ICD-10-CM

## 2020-12-04 DIAGNOSIS — R922 Inconclusive mammogram: Secondary | ICD-10-CM | POA: Diagnosis not present

## 2020-12-04 DIAGNOSIS — C50411 Malignant neoplasm of upper-outer quadrant of right female breast: Secondary | ICD-10-CM | POA: Insufficient documentation

## 2020-12-16 ENCOUNTER — Ambulatory Visit: Payer: Medicare HMO | Admitting: Surgery

## 2020-12-21 ENCOUNTER — Ambulatory Visit: Payer: Medicare HMO | Admitting: Surgery

## 2020-12-21 ENCOUNTER — Other Ambulatory Visit: Payer: Self-pay

## 2020-12-21 ENCOUNTER — Encounter: Payer: Self-pay | Admitting: Surgery

## 2020-12-21 VITALS — BP 151/85 | HR 78 | Temp 97.8°F | Ht 65.0 in | Wt 154.4 lb

## 2020-12-21 DIAGNOSIS — C50411 Malignant neoplasm of upper-outer quadrant of right female breast: Secondary | ICD-10-CM

## 2020-12-21 DIAGNOSIS — Z853 Personal history of malignant neoplasm of breast: Secondary | ICD-10-CM

## 2020-12-21 NOTE — Patient Instructions (Addendum)
The patient has been asked to return to the office in one year with a bilateral diagnostic mammogram.Continue self breast exams. Call office for any new breast issues or concerns.  

## 2020-12-22 ENCOUNTER — Encounter: Payer: Self-pay | Admitting: Surgery

## 2020-12-22 NOTE — Progress Notes (Signed)
Outpatient Surgical Follow Up  12/22/2020  Tanya Frey is an 75 y.o. female.   Chief Complaint  Patient presents with   Routine Post Op    F/u mammo and breast exam    HPI: This is a 75 year old female right breast outer upper quadrant cancer ER/PR positive HER-2 negative.  underwent uneventful lumpectomy with negative margins 01/2020 by Me.  Negative nodes.  finished radiation therapy with some skin radiation changes on the right breast.She has recovered from LE fracture and ambulates normally. Regarding her breast she has no complaints other just mild soreness after radiation therapy and discoloration of the right breast.  No discharge no fevers no chills no weight loss.   Recent mammogram personally reviewed showing no evidence of suspicious lesions.   Past Medical History:  Diagnosis Date   Anemia    Breast cancer in female [facility]) 12/27/2019   Right breast   Difficult intubation    Diffuse cystic mastopathy    Family history of malignant neoplasm of breast    Hyperlipidemia    Obesity, unspecified    Personal history of tobacco use, presenting hazards to health    Thyroid cancer ([place])    T4a, NX: 4.6 cm lesion with extrathyroidal extension. Received I 131 post procedure.     Varicose veins     Past Surgical History:  Procedure Laterality Date   BREAST LUMPECTOMY,RADIO FREQ LOCALIZER,AXILLARY SENTINEL LYMPH NODE BIOPSY Right 01/14/2020   Procedure: BREAST LUMPECTOMY,RADIO FREQ LOCALIZER,AXILLARY SENTINEL LYMPH NODE BIOPSY;  Surgeon: Jules Husbands, MD;  Location: [facility];  Service: General;  Laterality: Right;   BREAST SURGERY     COLONOSCOPY  2011   Dr. [name]; colon polyps (tubular adenoma)   COLONOSCOPY N/A    COLONOSCOPY WITH PROPOFOL N/A 10/10/2014   Procedure: COLONOSCOPY WITH PROPOFOL;  Surgeon: [name], MD;  Location: [place];  Service: Endoscopy;  Laterality: N/A;   COLONOSCOPY WITH PROPOFOL N/A 08/05/2020   Procedure: COLONOSCOPY WITH  PROPOFOL;  Surgeon: Robert Bellow, MD;  Location: [facility];  Service: Endoscopy;  Laterality: N/A;   FRACTURE SURGERY     ORIF ANKLE FRACTURE Right 03/16/2020   Procedure: OPEN REDUCTION INTERNAL FIXATION (ORIF) ANKLE FRACTURE;  Surgeon: [name], MD;  Location: [facility];  Service: Orthopedics;  Laterality: Right;   THYROID SURGERY Bilateral    THYROIDECTOMY N/A 02/21/2017   Procedure: THYROIDECTOMY;  Surgeon: [name], MD;  Location: [facility];  Service: ENT;  Laterality: N/A;   TUBAL LIGATION     VARICOSE VEIN SURGERY     vein closure procedure Right 2009    Family History  Problem Relation Age of Onset   Breast cancer Daughter 58       [name] BRCA negative    Social History:  reports that she quit smoking about 30 years ago. Her smoking use included cigarettes. She has a 10.00 pack-year smoking history. She has never used smokeless tobacco. She reports current alcohol use of about 1.0 - 4.0 standard drink per week. She reports that she does not use drugs.  Allergies:  Allergies  Allergen Reactions   Cefdinir Rash    Pt tolerated exposure to cefazolin   Protonix [Pantoprazole Sodium] Rash    Medications reviewed.    ROS Full ROS performed and is otherwise negative other than what is stated in HPI   BP (!) 151/85   Pulse 78   Temp 97.8 F (36.6 C) (Oral)   Ht _0  (1.651 m)  Wt 154 lb 6.4 oz (70 kg)   SpO2 94%   BMI 25.69 kg/m   Physical Exam Vitals and nursing note reviewed. Exam conducted with a chaperone present.  Constitutional:      General: She is not in acute distress.    Appearance: Normal appearance. She is normal weight.  Eyes:     General:        Right eye: No discharge.        Left eye: No discharge.  Pulmonary:     Effort: Pulmonary effort is normal. No respiratory distress.     Breath sounds: Normal breath sounds.     Comments: BREAST: Lumpectomy and sentinel lymph node scars,  Evidence of radiation changes to  the right breast/there is no evidence of infection there is no evidence of new masses seromas or complications.  Left breast completely normal Abdominal:     General: Abdomen is flat. There is no distension.     Palpations: Abdomen is soft. There is no mass.  Musculoskeletal:        General: No swelling or tenderness. Normal range of motion.     Cervical back: Normal range of motion and neck supple. No rigidity or tenderness.  Skin:    Capillary Refill: Capillary refill takes less than 2 seconds.  Neurological:     General: No focal deficit present.     Mental Status: She is alert and oriented to person, place, and time.  Psychiatric:        Mood and Affect: Mood normal.        Behavior: Behavior normal.        Thought Content: Thought content normal.        Judgment: Judgment normal.    Assessment/Plan: 75 year old female history of breast cancer status postradiation and lumpectomy doing very well currently Letrozole hormonal therapy. No evidence of recurrence.  I will see her back in 1 year with follow-up mammogram and physical exam Greater than 50% of the 30 minutes  visit was spent in counseling/coordination of care  Caroleen Hamman, MD [place] Surgeon

## 2021-02-22 ENCOUNTER — Other Ambulatory Visit: Payer: Self-pay | Admitting: Oncology

## 2021-02-22 DIAGNOSIS — Z170 Estrogen receptor positive status [ER+]: Secondary | ICD-10-CM

## 2021-02-22 DIAGNOSIS — C50411 Malignant neoplasm of upper-outer quadrant of right female breast: Secondary | ICD-10-CM

## 2021-03-16 DIAGNOSIS — R399 Unspecified symptoms and signs involving the genitourinary system: Secondary | ICD-10-CM | POA: Diagnosis not present

## 2021-03-17 DIAGNOSIS — E890 Postprocedural hypothyroidism: Secondary | ICD-10-CM | POA: Diagnosis not present

## 2021-03-17 DIAGNOSIS — Z85850 Personal history of malignant neoplasm of thyroid: Secondary | ICD-10-CM | POA: Diagnosis not present

## 2021-03-24 DIAGNOSIS — E890 Postprocedural hypothyroidism: Secondary | ICD-10-CM | POA: Diagnosis not present

## 2021-03-24 DIAGNOSIS — Z85850 Personal history of malignant neoplasm of thyroid: Secondary | ICD-10-CM | POA: Diagnosis not present

## 2021-04-07 ENCOUNTER — Encounter: Payer: Self-pay | Admitting: Radiation Oncology

## 2021-04-07 ENCOUNTER — Other Ambulatory Visit: Payer: Self-pay

## 2021-04-07 ENCOUNTER — Ambulatory Visit
Admission: RE | Admit: 2021-04-07 | Discharge: 2021-04-07 | Disposition: A | Discharge status: Home / Self Care | Payer: Medicare HMO | Source: Ambulatory Visit | Attending: Radiation Oncology | Admitting: Radiation Oncology

## 2021-04-07 VITALS — BP 155/82 | HR 70 | Temp 96.8°F | Resp 16 | Wt 153.3 lb

## 2021-04-07 DIAGNOSIS — Z170 Estrogen receptor positive status [ER+]: Secondary | ICD-10-CM | POA: Diagnosis not present

## 2021-04-07 DIAGNOSIS — Z853 Personal history of malignant neoplasm of breast: Secondary | ICD-10-CM | POA: Diagnosis not present

## 2021-04-07 DIAGNOSIS — Z79811 Long term (current) use of aromatase inhibitors: Secondary | ICD-10-CM | POA: Diagnosis not present

## 2021-04-07 DIAGNOSIS — Z08 Encounter for follow-up examination after completed treatment for malignant neoplasm: Secondary | ICD-10-CM | POA: Diagnosis not present

## 2021-04-07 DIAGNOSIS — C50411 Malignant neoplasm of upper-outer quadrant of right female breast: Secondary | ICD-10-CM | POA: Insufficient documentation

## 2021-04-07 DIAGNOSIS — Z923 Personal history of irradiation: Secondary | ICD-10-CM | POA: Diagnosis not present

## 2021-04-07 NOTE — Progress Notes (Signed)
Radiation Oncology ?Follow up Note ? ?Name: Tanya Frey   ?Date:   04/07/2021 ?MRN:  [id number] ?DOB: 30-Sep-1945  ? ? ?This 76 y.o. female presents to the clinic today for 1 year follow-up status post whole breast radiation to her right breast for stage I ER/PR positive invasive mammary carcinoma. ? ?REFERRING PROVIDER: [name], MD ? ?HPI: Patient is a 76 year old female now at 1 year having completed whole breast radiation to her right breast for stage I ER/PR positive invasive mammary carcinoma.  Seen today in routine follow-up she is doing well.  She specifically denies breast tenderness cough or bone pain..  She had mammograms back in October which I have reviewed were BI-RADS 2 benign.  She is currently on letrozole tolerating that well without side effect. ? ?COMPLICATIONS OF TREATMENT: none ? ?FOLLOW UP COMPLIANCE: keeps appointments  ? ?PHYSICAL EXAM:  ?BP (!) 155/82 (BP Location: Left Arm, Patient Position: Sitting)   Pulse 70   Temp (!) 96.8 ?F (36 ?C) (Tympanic)   Resp 16   Wt 153 lb 4.8 oz (69.5 kg)   BMI 25.51 kg/m?  ?Lungs are clear to A&P cardiac examination essentially unremarkable with regular rate and rhythm. No dominant mass or nodularity is noted in either breast in 2 positions examined. Incision is well-healed. No axillary or supraclavicular adenopathy is appreciated. Cosmetic result is excellent.  Well-developed well-nourished patient in NAD. HEENT reveals PERLA, EOMI, discs not visualized.  Oral cavity is clear. No oral mucosal lesions are identified. Neck is clear without evidence of cervical or supraclavicular adenopathy. Lungs are clear to A&P. Cardiac examination is essentially unremarkable with regular rate and rhythm without murmur rub or thrill. Abdomen is benign with no organomegaly or masses noted. Motor sensory and DTR levels are equal and symmetric in the upper and lower extremities. Cranial nerves II through XII are grossly intact. Proprioception is intact. No  peripheral adenopathy or edema is identified. No motor or sensory levels are noted. Crude visual fields are within normal range. ? ?RADIOLOGY RESULTS: Mammograms reviewed compatible with above-stated findings ? ?PLAN: Present time patient is now 1 year out with no evidence of disease.  On pleased with her overall progress.  I have asked to see her back in 1 year for follow-up.  Patient knows to call with any concerns. ? ?I would like to take this opportunity to thank you for allowing me to participate in the care of your patient.. ?  ? Noreene Filbert, MD ? ?

## 2021-05-12 ENCOUNTER — Encounter: Payer: Self-pay | Admitting: Oncology

## 2021-05-13 ENCOUNTER — Other Ambulatory Visit: Payer: Self-pay | Admitting: Oncology

## 2021-05-18 ENCOUNTER — Inpatient Hospital Stay: Payer: Medicare HMO | Attending: Oncology

## 2021-05-18 DIAGNOSIS — F0390 Unspecified dementia without behavioral disturbance: Secondary | ICD-10-CM | POA: Insufficient documentation

## 2021-05-18 DIAGNOSIS — Z79899 Other long term (current) drug therapy: Secondary | ICD-10-CM | POA: Insufficient documentation

## 2021-05-18 DIAGNOSIS — Z170 Estrogen receptor positive status [ER+]: Secondary | ICD-10-CM | POA: Insufficient documentation

## 2021-05-18 DIAGNOSIS — Z85850 Personal history of malignant neoplasm of thyroid: Secondary | ICD-10-CM | POA: Diagnosis not present

## 2021-05-18 DIAGNOSIS — Z87891 Personal history of nicotine dependence: Secondary | ICD-10-CM | POA: Insufficient documentation

## 2021-05-18 DIAGNOSIS — Z79811 Long term (current) use of aromatase inhibitors: Secondary | ICD-10-CM | POA: Diagnosis not present

## 2021-05-18 DIAGNOSIS — E871 Hypo-osmolality and hyponatremia: Secondary | ICD-10-CM | POA: Diagnosis not present

## 2021-05-18 DIAGNOSIS — C50411 Malignant neoplasm of upper-outer quadrant of right female breast: Secondary | ICD-10-CM | POA: Diagnosis not present

## 2021-05-18 DIAGNOSIS — M8580 Other specified disorders of bone density and structure, unspecified site: Secondary | ICD-10-CM | POA: Insufficient documentation

## 2021-05-18 LAB — COMPREHENSIVE METABOLIC PANEL
ALT: 15 U/L (ref 0–44)
AST: 20 U/L (ref 15–41)
Albumin: 4 g/dL (ref 3.5–5.0)
Alkaline Phosphatase: 30 U/L — ABNORMAL LOW (ref 38–126)
Anion gap: 10 (ref 5–15)
BUN: 15 mg/dL (ref 8–23)
CO2: 27 mmol/L (ref 22–32)
Calcium: 9.2 mg/dL (ref 8.9–10.3)
Chloride: 100 mmol/L (ref 98–111)
Creatinine, Ser: 0.63 mg/dL (ref 0.44–1.00)
GFR, Estimated: >60 mL/min (ref >60)
Glucose, Bld: 102 mg/dL — ABNORMAL HIGH (ref 70–99)
Potassium: 4.1 mmol/L (ref 3.5–5.1)
Sodium: 137 mmol/L (ref 135–145)
Total Bilirubin: 0.6 mg/dL (ref 0.3–1.2)
Total Protein: 7 g/dL (ref 6.5–8.1)

## 2021-05-18 LAB — CBC WITH DIFFERENTIAL/PLATELET
Abs Immature Granulocytes: 0.02 10*3/uL (ref 0.00–0.07)
Basophils Absolute: 0.1 10*3/uL (ref 0.0–0.1)
Basophils Relative: 1 %
Eosinophils Absolute: 0 10*3/uL (ref 0.0–0.5)
Eosinophils Relative: 1 %
HCT: 41.8 % (ref 36.0–46.0)
Hemoglobin: 13.9 g/dL (ref 12.0–15.0)
Immature Granulocytes: 0 %
Lymphocytes Relative: 37 %
Lymphs Abs: 1.8 10*3/uL (ref 0.7–4.0)
MCH: 29.5 pg (ref 26.0–34.0)
MCHC: 33.3 g/dL (ref 30.0–36.0)
MCV: 88.7 fL (ref 80.0–100.0)
Monocytes Absolute: 0.4 10*3/uL (ref 0.1–1.0)
Monocytes Relative: 9 %
Neutro Abs: 2.5 10*3/uL (ref 1.7–7.7)
Neutrophils Relative %: 52 %
Platelets: 220 10*3/uL (ref 150–400)
RBC: 4.71 MIL/uL (ref 3.87–5.11)
RDW: 13.6 % (ref 11.5–15.5)
WBC: 4.9 10*3/uL (ref 4.0–10.5)
nRBC: 0 % (ref 0.0–0.2)

## 2021-05-18 LAB — OSMOLALITY: Osmolality: 287 mOsm/kg (ref 275–295)

## 2021-05-19 LAB — CANCER ANTIGEN 27.29: CA 27.29: 15.8 U/mL (ref 0.0–38.6)

## 2021-05-21 ENCOUNTER — Inpatient Hospital Stay (HOSPITAL_BASED_OUTPATIENT_CLINIC_OR_DEPARTMENT_OTHER): Payer: Medicare HMO | Admitting: Oncology

## 2021-05-21 ENCOUNTER — Encounter: Payer: Self-pay | Admitting: Oncology

## 2021-05-21 ENCOUNTER — Inpatient Hospital Stay: Payer: Medicare HMO

## 2021-05-21 VITALS — BP 97/76 | HR 76 | Temp 98.2°F | Resp 16 | Ht 65.0 in | Wt 151.2 lb

## 2021-05-21 DIAGNOSIS — M8588 Other specified disorders of bone density and structure, other site: Secondary | ICD-10-CM

## 2021-05-21 DIAGNOSIS — Z85850 Personal history of malignant neoplasm of thyroid: Secondary | ICD-10-CM | POA: Diagnosis not present

## 2021-05-21 DIAGNOSIS — C50411 Malignant neoplasm of upper-outer quadrant of right female breast: Secondary | ICD-10-CM

## 2021-05-21 DIAGNOSIS — Z87891 Personal history of nicotine dependence: Secondary | ICD-10-CM | POA: Diagnosis not present

## 2021-05-21 DIAGNOSIS — Z79811 Long term (current) use of aromatase inhibitors: Secondary | ICD-10-CM | POA: Diagnosis not present

## 2021-05-21 DIAGNOSIS — E871 Hypo-osmolality and hyponatremia: Secondary | ICD-10-CM | POA: Diagnosis not present

## 2021-05-21 DIAGNOSIS — M8580 Other specified disorders of bone density and structure, unspecified site: Secondary | ICD-10-CM | POA: Diagnosis not present

## 2021-05-21 DIAGNOSIS — C73 Malignant neoplasm of thyroid gland: Secondary | ICD-10-CM

## 2021-05-21 DIAGNOSIS — Z170 Estrogen receptor positive status [ER+]: Secondary | ICD-10-CM | POA: Diagnosis not present

## 2021-05-21 DIAGNOSIS — F0390 Unspecified dementia without behavioral disturbance: Secondary | ICD-10-CM | POA: Diagnosis not present

## 2021-05-21 DIAGNOSIS — Z79899 Other long term (current) drug therapy: Secondary | ICD-10-CM | POA: Diagnosis not present

## 2021-05-21 MED ORDER — DENOSUMAB 60 MG/ML ~~LOC~~ SOSY
60.0000 mg | PREFILLED_SYRINGE | Freq: Once | SUBCUTANEOUS | Status: AC
  Administered 2021-05-21: 60 mg via SUBCUTANEOUS
  Filled 2021-05-21: qty 1

## 2021-05-21 MED ORDER — LETROZOLE 2.5 MG PO TABS: ORAL_TABLET | ORAL | 1 refills | Status: DC

## 2021-05-21 NOTE — Progress Notes (Signed)
?Hematology/Oncology Progress note ?[phone] [phone] ?  ? ? ? ?Clinic Day:  05/21/2021 ? ?Referring physician: [name], MD ? ?Chief Complaint: Tanya Frey is a 76 y.o. female presents for stage III papillary carcinoma thyroid and stage IA right breast cancer  ? ?PERTINENT ONCOLOGY HISTORY ?Patient previously followed up by Dr.[name], patient switched care to me on 08/27/20 ?Extensive medical record review was performed by me ?  ?# Stage III papillary carcinoma thyroid carcinoma s/p thyroidectomy in 02/21/2017.  Pathology revealed a 4.6 cm papillary thyroid carcinoma with extrathyroidal extension.  There was angioinvasion but no lymphatic invasion.  Margins were negative.  Pathologic stage was pT4a Nx (stage III). ? ?Soft tissue neck CT on 01/11/2017 revealed a 5 cm complex cystic and solid mass arising from the right lobe of the thyroid compatible with known carcinoma. There was no malignant adenopathy.She received 130.6 mCi I-131 with Thyrogen stimulation on 04/26/2017.  Whole body I-131 scan on 05/04/2017 revealed uptake at thyroid bed consistent with thyroid remnant.  There was no scintigraphic evidence of iodine-avid metastatic thyroid cancer.  Thyroid ultrasound on 08/06/2018 revealed no residual or recurrent tissue post thyroidectomy. ?She takes Synthroid 112 mcg daily. ?She has chronic swallowing issues post surgery.  ? ? ?# stage IA right breast cancer s/p partial mastectomy on 01/14/2020.  Pathology revealed a 7 mm grade 1 invasive mammary carcinoma (NOS, ductal).  There was a separate focus of DCIS and intraductal papilloma.  DCIS margins were 3 mm; invasive carcinoma margins were 6 mm.  One sentinel lymph node was negative.  Tumor was ER+ (>90%), PR+ (> 90%), and HER-2 negative (score 0).  Pathologic stage was pT1bpN0.  CA27.29 was 16.3 on 01/07/2020. ? ?Oncotype DX testing revealed a recurrence score of 16 which translated to a risk of distant recurrence at 9  years of 15% (95% CI 10-19%) and no benefit of chemotherapy. ? ?She received radiation to the right breast from 02/25/2020 - 03/27/2020. She received 42.56 Gy in 16 fractions. She received a Boost of 10 Gy in 5 fractions.  ?05/11/2020.Started on Letrozole ? ? ?# Osteopenia  ?Bone density on 07/05/2016 revealed osteopenia with a T-score of -1.5 in L1-L4 and -13 in the left femoral neck.  Bone density on 08/15/2018 revealed osteopenia in the left femur neck with a T-score of -1.1 in the left femoral neck.  Bone density on 04/02/2020 ([facility]) revealed osteopenia with a T score of -1.5 in the lumbar spine L1-L4.  She continued calcium and vitamin D.  She began Prolia on 05/06/2020. ? ? ?INTERVAL HISTORY ?Tanya Frey is a 76 y.o. female who has above history reviewed by me today presents for follow up visit for history of breast cancer, osteopenia, and history of thyroid cancer.  ?Patient was accompanied by her husband.  She has dementia, she is currently on Prevagen. ?Patient is on letrozole, she tolerates well.  Denies any side effects. ? ?Review of Systems  ?Constitutional:  Negative for appetite change, chills, fatigue and fever.  ?HENT:   Negative for hearing loss and voice change.   ?Eyes:  Negative for eye problems.  ?Respiratory:  Negative for chest tightness and cough.   ?Cardiovascular:  Negative for chest pain.  ?Gastrointestinal:  Negative for abdominal distention, abdominal pain and blood in stool.  ?Endocrine: Negative for hot flashes.  ?Genitourinary:  Negative for difficulty urinating and frequency.   ?Musculoskeletal:  Negative for arthralgias.  ?Skin:  Negative for itching and rash.  ?Neurological:  Negative  for extremity weakness.  ?Hematological:  Negative for adenopathy.  ?Psychiatric/Behavioral:    ?     Memory loss  ? ? ?Past Medical History:  ?Diagnosis Date  ? Anemia   ? Breast cancer ([place])   ? Breast cancer in female [facility]) 12/27/2019  ? Right breast  ? Difficult intubation   ? Diffuse cystic  mastopathy   ? Family history of malignant neoplasm of breast   ? Hyperlipidemia   ? Obesity, unspecified   ? Personal history of tobacco use, presenting hazards to health   ? Thyroid cancer ([place])   ? T4a, NX: 4.6 cm lesion with extrathyroidal extension. Received I 131 post procedure.    ? Varicose veins   ? ? ?Past Surgical History:  ?Procedure Laterality Date  ? BREAST LUMPECTOMY,RADIO FREQ LOCALIZER,AXILLARY SENTINEL LYMPH NODE BIOPSY Right 01/14/2020  ? Procedure: BREAST LUMPECTOMY,RADIO FREQ LOCALIZER,AXILLARY SENTINEL LYMPH NODE BIOPSY;  Surgeon: Jules Husbands, MD;  Location: [facility] ORS;  Service: General;  Laterality: Right;  ? BREAST SURGERY    ? COLONOSCOPY  2011  ? Dr. [name]; colon polyps (tubular adenoma)  ? COLONOSCOPY N/A   ? COLONOSCOPY WITH PROPOFOL N/A 10/10/2014  ? Procedure: COLONOSCOPY WITH PROPOFOL;  Surgeon: [name], MD;  Location: [facility] ENDOSCOPY;  Service: Endoscopy;  Laterality: N/A;  ? COLONOSCOPY WITH PROPOFOL N/A 08/05/2020  ? Procedure: COLONOSCOPY WITH PROPOFOL;  Surgeon: Robert Bellow, MD;  Location: [facility] ENDOSCOPY;  Service: Endoscopy;  Laterality: N/A;  ? FRACTURE SURGERY    ? ORIF ANKLE FRACTURE Right 03/16/2020  ? Procedure: OPEN REDUCTION INTERNAL FIXATION (ORIF) ANKLE FRACTURE;  Surgeon: [name], MD;  Location: [facility] ORS;  Service: Orthopedics;  Laterality: Right;  ? THYROID SURGERY Bilateral   ? THYROIDECTOMY N/A 02/21/2017  ? Procedure: THYROIDECTOMY;  Surgeon: [name], MD;  Location: [facility] ORS;  Service: ENT;  Laterality: N/A;  ? TUBAL LIGATION    ? VARICOSE VEIN SURGERY    ? vein closure procedure Right 2009  ? ? ?Family History  ?Problem Relation Age of Onset  ? Breast cancer Daughter 64  ?     [name] BRCA negative  ? ? ?Social History:  reports that she quit smoking about 31 years ago. Her smoking use included cigarettes. She has a 10.00 pack-year smoking history. She has never used smokeless tobacco. She reports current alcohol use of about  1.0 - 4.0 standard drink per week. She reports that she does not use drugs.   ?Allergies:  ?Allergies  ?Allergen Reactions  ? Cefdinir Rash  ?  Pt tolerated exposure to cefazolin  ? Protonix [Pantoprazole Sodium] Rash  ? ? ?Current Medications: ?Current Outpatient Medications  ?Medication Sig Dispense Refill  ? Apoaequorin (PREVAGEN) 10 MG CAPS Take 1 capsule by mouth daily.    ? aspirin EC 81 MG tablet Take 81 mg by mouth daily. Swallow whole.    ? Calcium-Magnesium-Vitamin D (CALCIUM 1200+D3 PO) Take 1,200 mg by mouth daily.     ? levothyroxine (SYNTHROID) 112 MCG tablet TAKE 1 TABLET(112 MCG) BY MOUTH DAILY BEFORE AND BREAKFAST 30 tablet 2  ? Multiple Vitamins-Minerals (MULTIVITAMIN WITH MINERALS) tablet Take 1 tablet by mouth daily.    ? simvastatin (ZOCOR) 40 MG tablet Take 40 mg by mouth every evening.     ? vitamin B-12 (CYANOCOBALAMIN) 1000 MCG tablet Take 1,000 mcg by mouth daily.    ? letrozole (FEMARA) 2.5 MG tablet TAKE 1 TABLET(2.5 MG) BY MOUTH DAILY 90 tablet 1  ? ?  No current facility-administered medications for this visit.  ? ? ?  ?Performance status (ECOG):  1 ? ?Vitals: ?Blood pressure 97/76, pulse 76, temperature 98.2 ?F (36.8 ?C), temperature source Oral, resp. rate 16, height '5\' 5"'  (1.651 m), weight 151 lb 3.2 oz (68.6 kg).  ? ?Physical Exam ?Vitals reviewed.  ?Constitutional:   ?   General: She is not in acute distress. ?   Appearance: She is well-developed. She is not diaphoretic.  ?HENT:  ?   Head: Normocephalic and atraumatic.  ?   Mouth/Throat:  ?   Mouth: Mucous membranes are moist.  ?   Pharynx: Oropharynx is clear. No oropharyngeal exudate.  ?Eyes:  ?   General: No scleral icterus. ?   Extraocular Movements: Extraocular movements intact.  ?   Conjunctiva/sclera: Conjunctivae normal.  ?   Pupils: Pupils are equal, round, and reactive to light.  ?Neck:  ?   Vascular: No JVD.  ?Cardiovascular:  ?   Rate and Rhythm: Normal rate and regular rhythm.  ?   Heart sounds: Normal heart sounds. No  murmur heard. ?Pulmonary:  ?   Effort: Pulmonary effort is normal. No respiratory distress.  ?   Breath sounds: Normal breath sounds. No wheezing or rales.  ?Chest:  ?   Chest wall: No tenderness.  ?B

## 2021-05-21 NOTE — Progress Notes (Signed)
Husband states that her memory is worse she taking med for memory ?

## 2021-06-23 DIAGNOSIS — Z0001 Encounter for general adult medical examination with abnormal findings: Secondary | ICD-10-CM | POA: Diagnosis not present

## 2021-06-23 DIAGNOSIS — E7800 Pure hypercholesterolemia, unspecified: Secondary | ICD-10-CM | POA: Diagnosis not present

## 2021-06-30 DIAGNOSIS — E890 Postprocedural hypothyroidism: Secondary | ICD-10-CM | POA: Diagnosis not present

## 2021-06-30 DIAGNOSIS — Z0000 Encounter for general adult medical examination without abnormal findings: Secondary | ICD-10-CM | POA: Diagnosis not present

## 2021-06-30 DIAGNOSIS — M8580 Other specified disorders of bone density and structure, unspecified site: Secondary | ICD-10-CM | POA: Diagnosis not present

## 2021-06-30 DIAGNOSIS — E7800 Pure hypercholesterolemia, unspecified: Secondary | ICD-10-CM | POA: Diagnosis not present

## 2021-06-30 DIAGNOSIS — Z0001 Encounter for general adult medical examination with abnormal findings: Secondary | ICD-10-CM | POA: Diagnosis not present

## 2021-06-30 DIAGNOSIS — Z1389 Encounter for screening for other disorder: Secondary | ICD-10-CM | POA: Diagnosis not present

## 2021-07-23 DIAGNOSIS — D485 Neoplasm of uncertain behavior of skin: Secondary | ICD-10-CM | POA: Diagnosis not present

## 2021-07-23 DIAGNOSIS — L821 Other seborrheic keratosis: Secondary | ICD-10-CM | POA: Diagnosis not present

## 2021-07-23 DIAGNOSIS — D225 Melanocytic nevi of trunk: Secondary | ICD-10-CM | POA: Diagnosis not present

## 2021-07-23 DIAGNOSIS — D2262 Melanocytic nevi of left upper limb, including shoulder: Secondary | ICD-10-CM | POA: Diagnosis not present

## 2021-07-23 DIAGNOSIS — D2239 Melanocytic nevi of other parts of face: Secondary | ICD-10-CM | POA: Diagnosis not present

## 2021-07-23 DIAGNOSIS — D2261 Melanocytic nevi of right upper limb, including shoulder: Secondary | ICD-10-CM | POA: Diagnosis not present

## 2021-09-14 DIAGNOSIS — E890 Postprocedural hypothyroidism: Secondary | ICD-10-CM | POA: Diagnosis not present

## 2021-09-14 DIAGNOSIS — Z85850 Personal history of malignant neoplasm of thyroid: Secondary | ICD-10-CM | POA: Diagnosis not present

## 2021-09-21 DIAGNOSIS — E890 Postprocedural hypothyroidism: Secondary | ICD-10-CM | POA: Diagnosis not present

## 2021-09-21 DIAGNOSIS — Z85850 Personal history of malignant neoplasm of thyroid: Secondary | ICD-10-CM | POA: Diagnosis not present

## 2021-09-30 ENCOUNTER — Other Ambulatory Visit: Payer: Self-pay

## 2021-09-30 DIAGNOSIS — Z170 Estrogen receptor positive status [ER+]: Secondary | ICD-10-CM

## 2021-10-29 IMAGING — RF DG C-ARM 1-60 MIN
1 series · 7 of 7 positions shown · non-contrast
Comparison: None.

CLINICAL DATA: Right ankle ORIF

EXAM:
RIGHT ANKLE - 2 VIEW; DG C-ARM 1-60 MIN
FLUOROSCOPY TIME:  29 seconds

[Series 1: run · 7 of 7 slices shown]
[im 1/7]
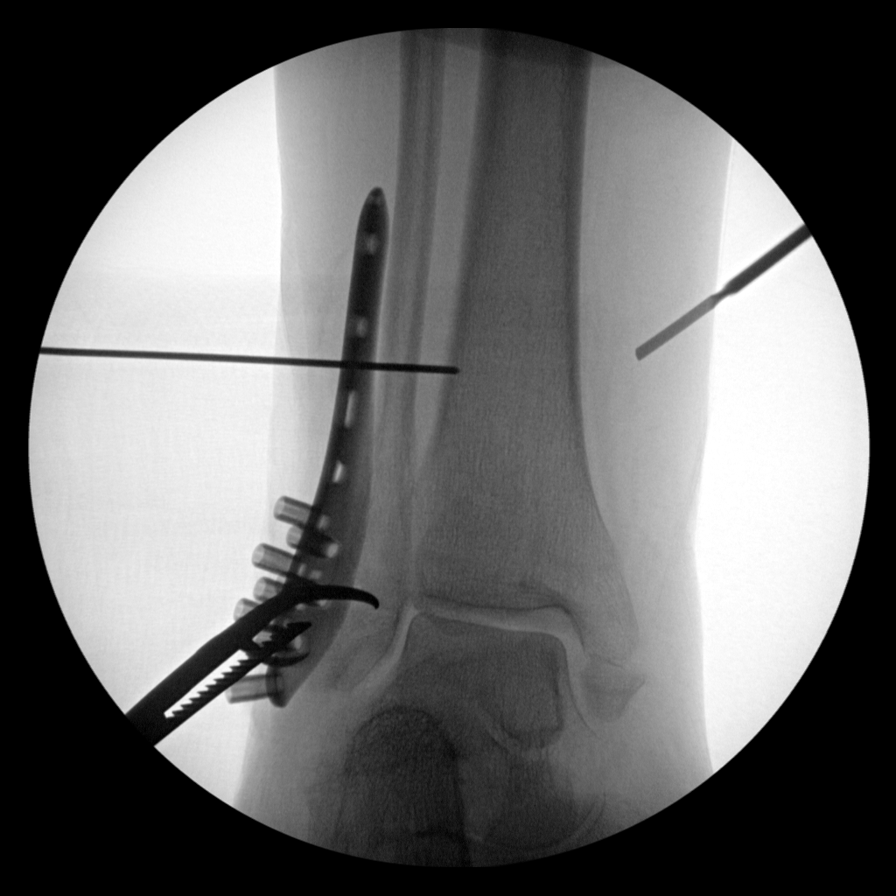
[im 2/7]
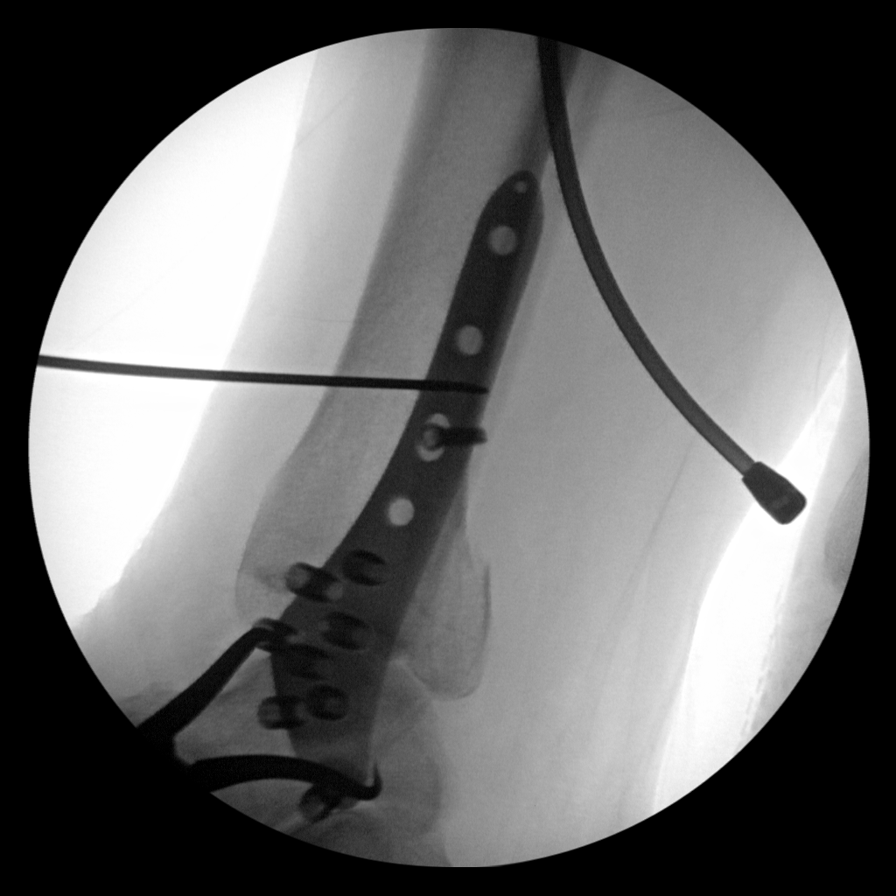
[im 3/7]
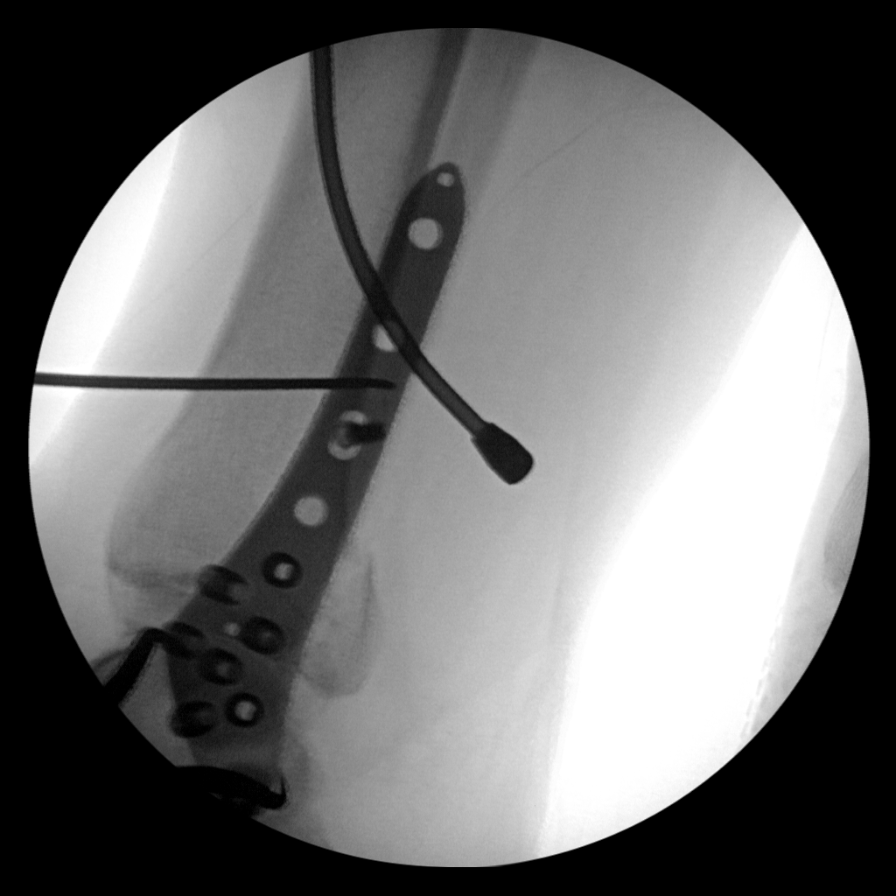
[im 4/7]
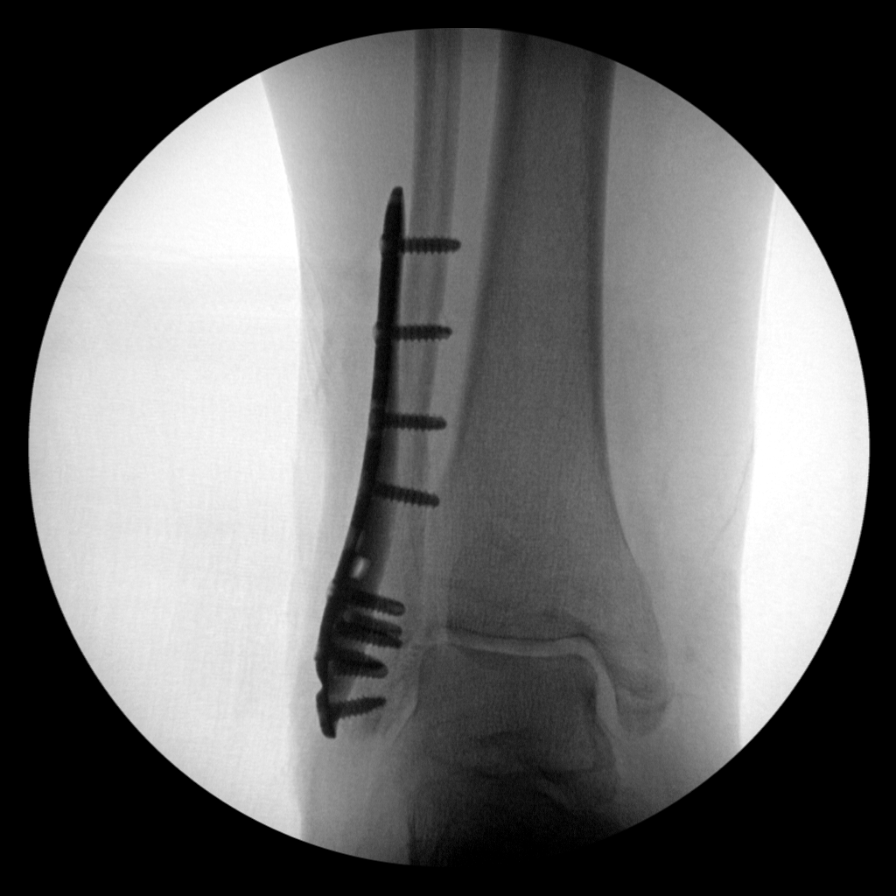
[im 5/7]
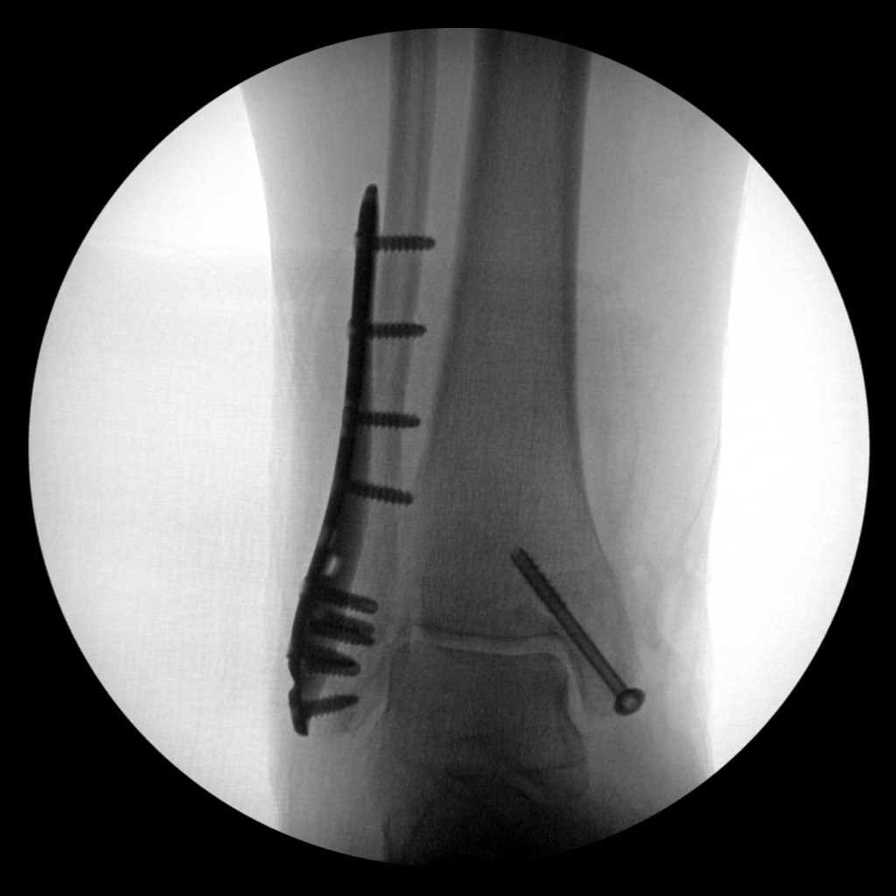
[im 6/7]
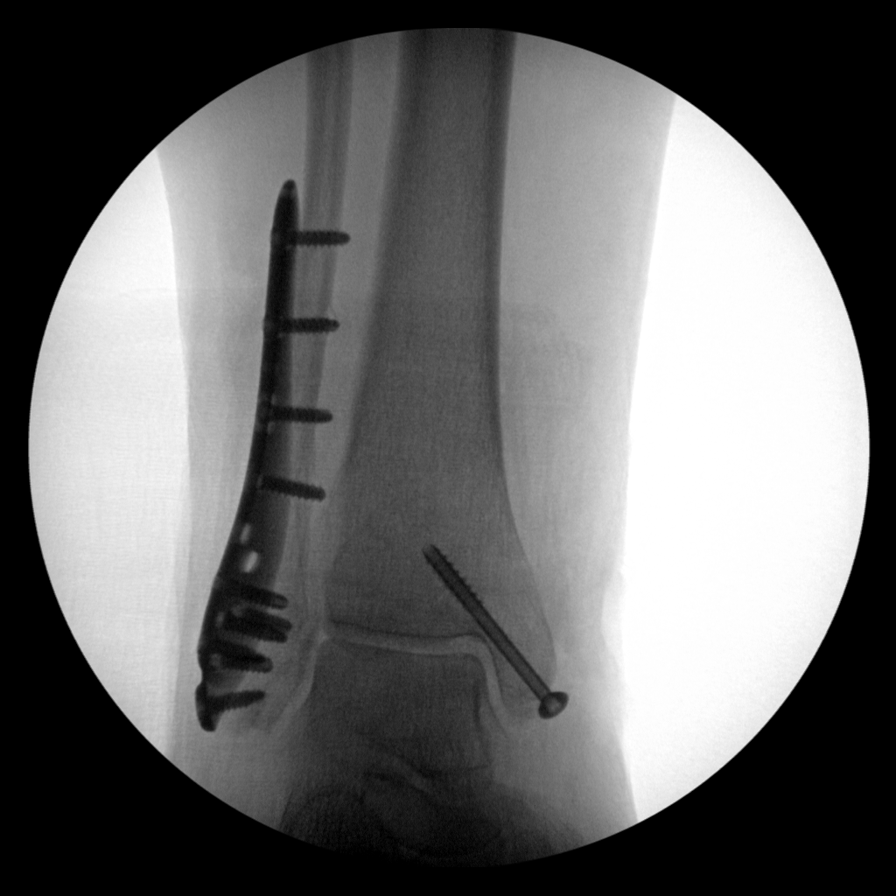
[im 7/7]
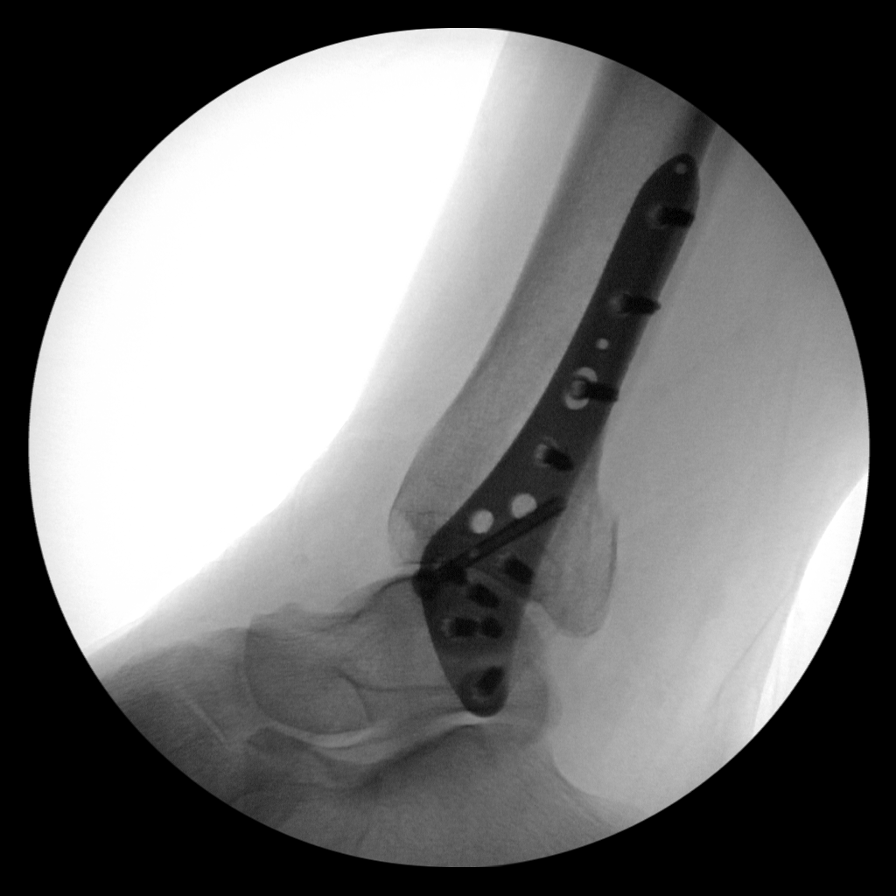

[7 of 7 positions shown; findings below may reference images not displayed]

FINDINGS: Multiple intraoperative images demonstrate plate and screw fixation
along the lateral aspect of the distal fibula across the previously
seen fracture. There is also placement of a screw traversing the
medial malleolus fracture. Near anatomic alignment.
IMPRESSION: Fluoroscopic guidance for ORIF of right ankle fractures.

## 2021-10-29 IMAGING — RF DG ANKLE 2V *R*
1 series · 7 of 7 positions shown · non-contrast
Comparison: None.

CLINICAL DATA: Right ankle ORIF

EXAM:
RIGHT ANKLE - 2 VIEW; DG C-ARM 1-60 MIN
FLUOROSCOPY TIME:  29 seconds

[Series 1: run · 7 of 7 slices shown]
[im 1/7]
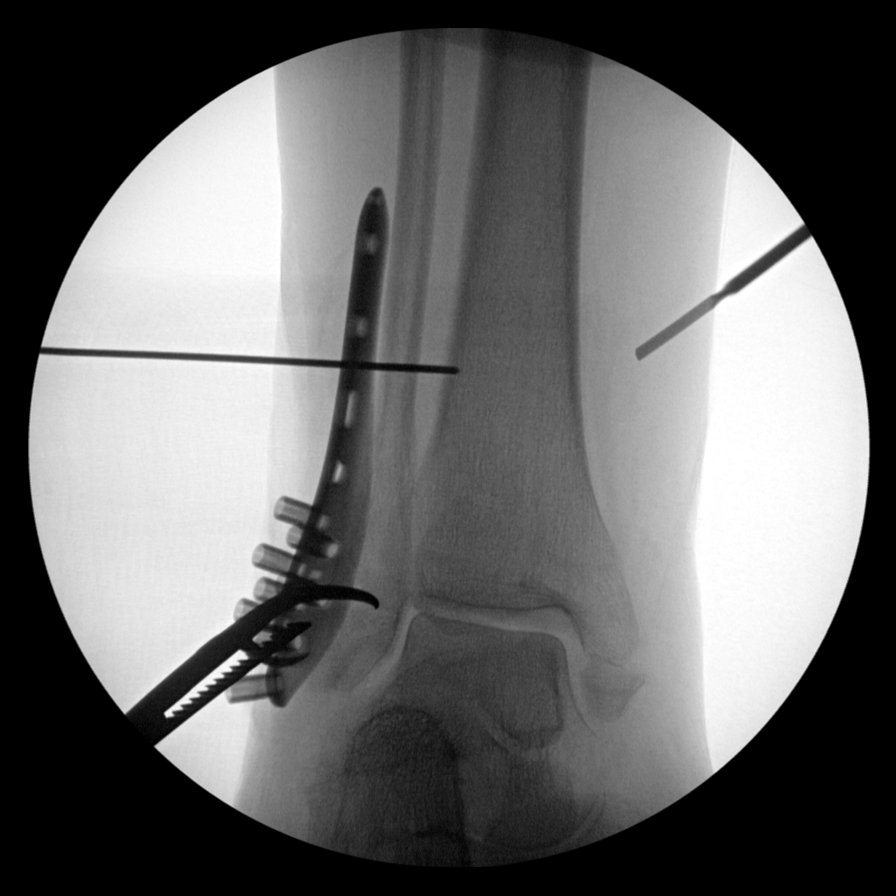
[im 2/7]
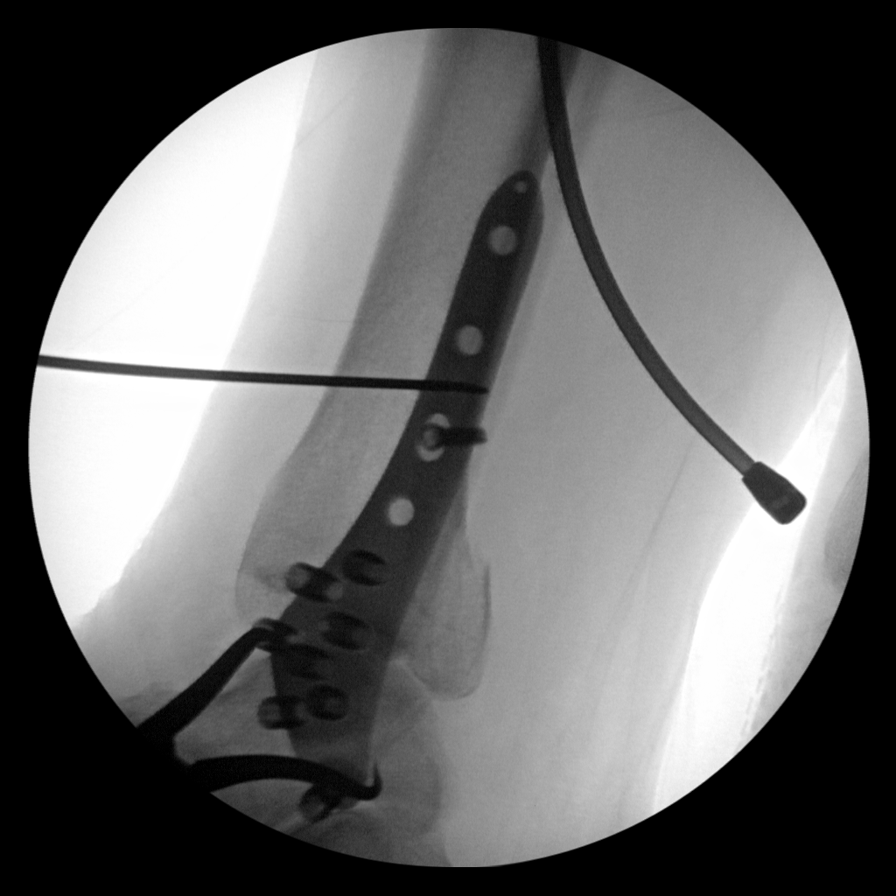
[im 3/7]
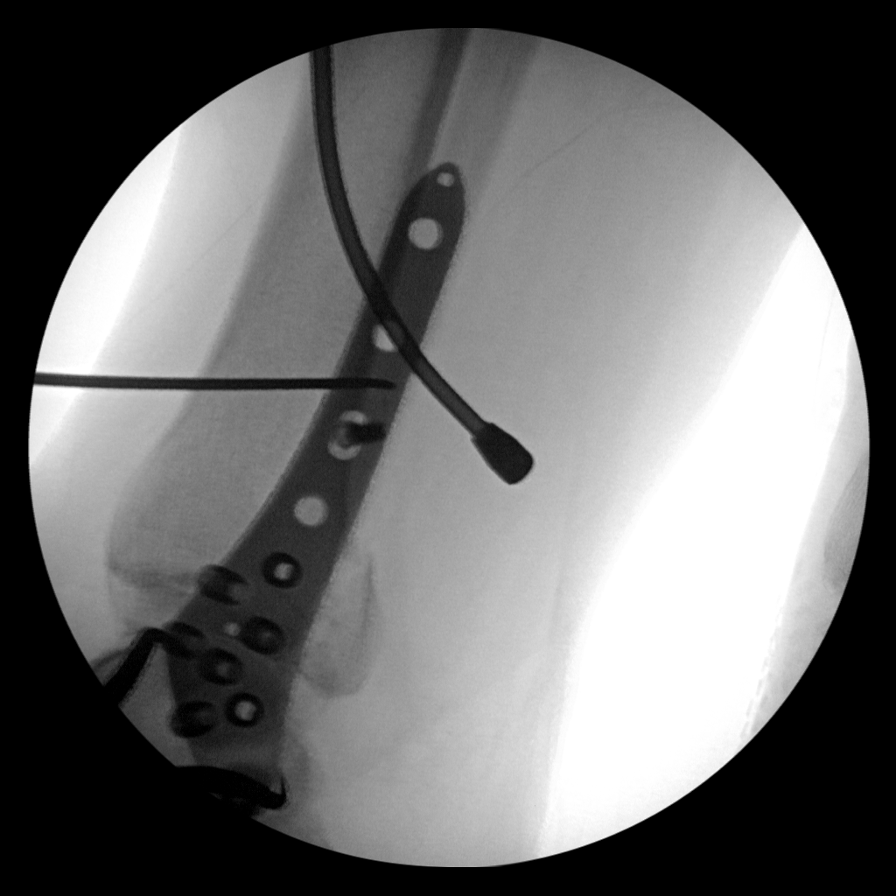
[im 4/7]
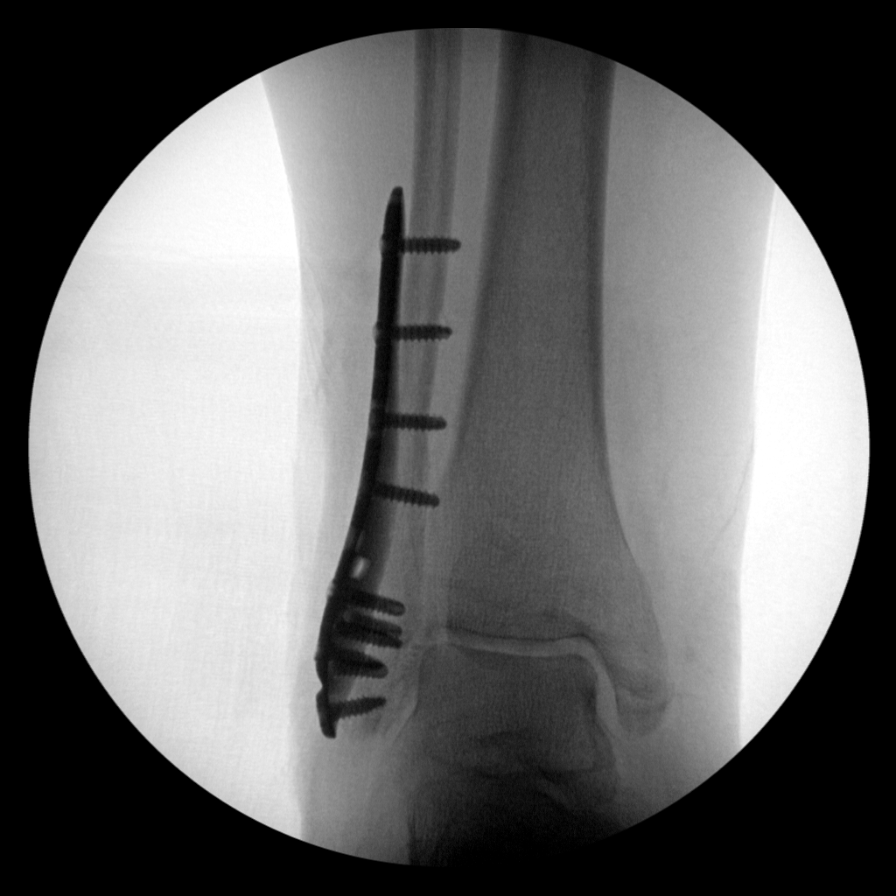
[im 5/7]
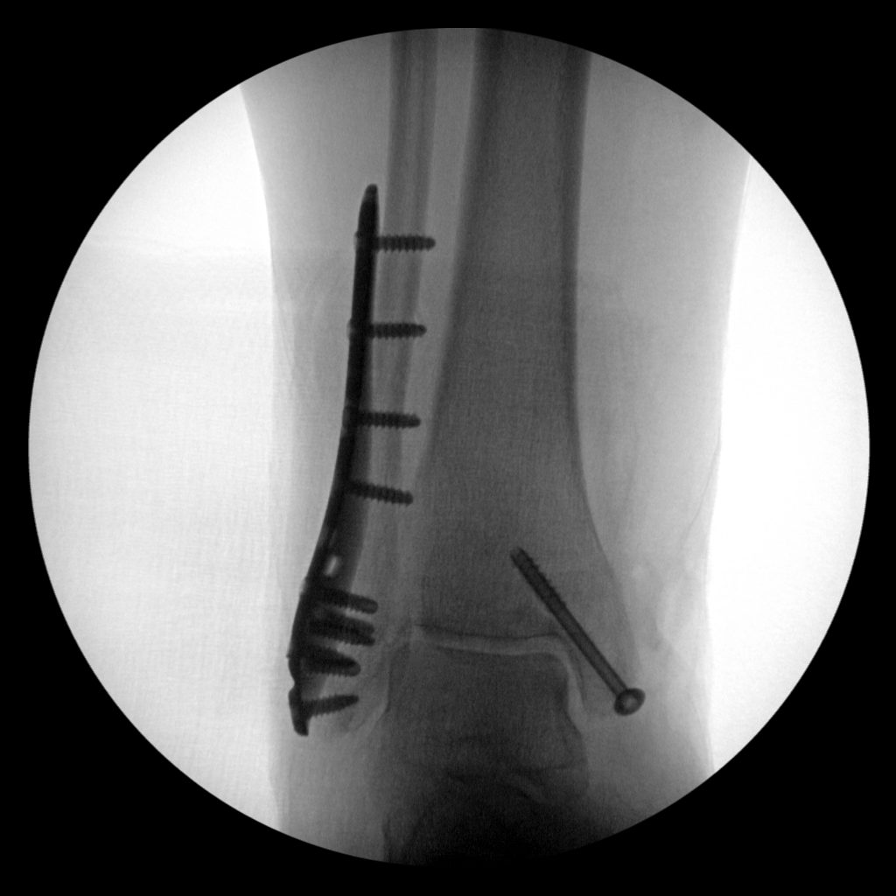
[im 6/7]
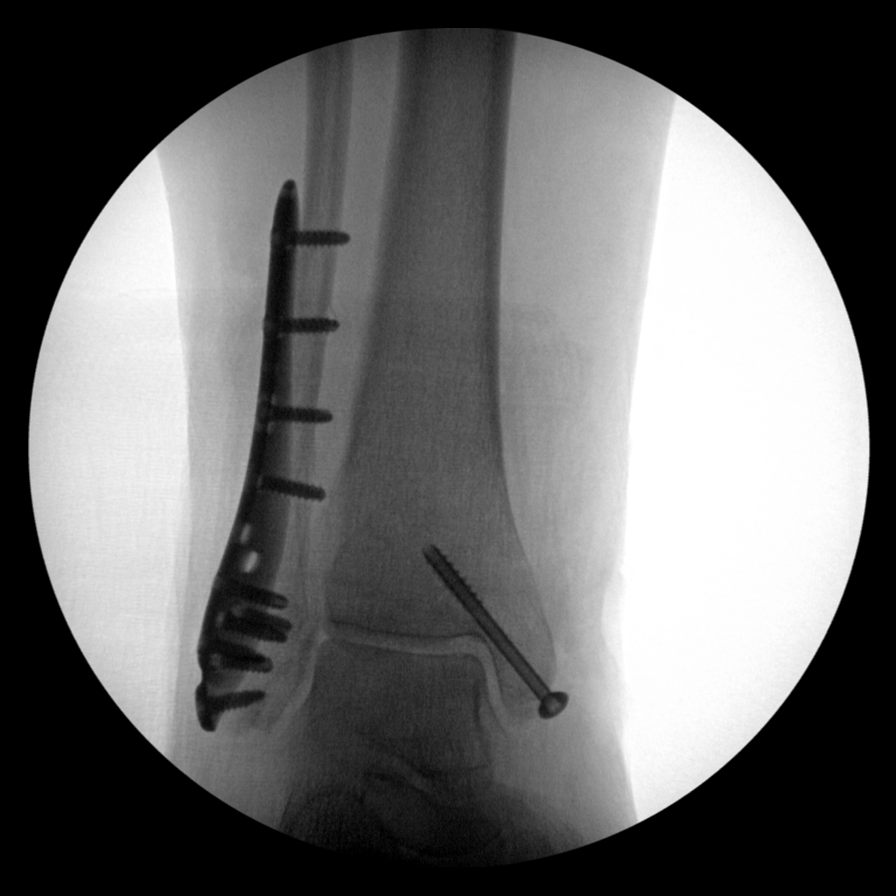
[im 7/7]
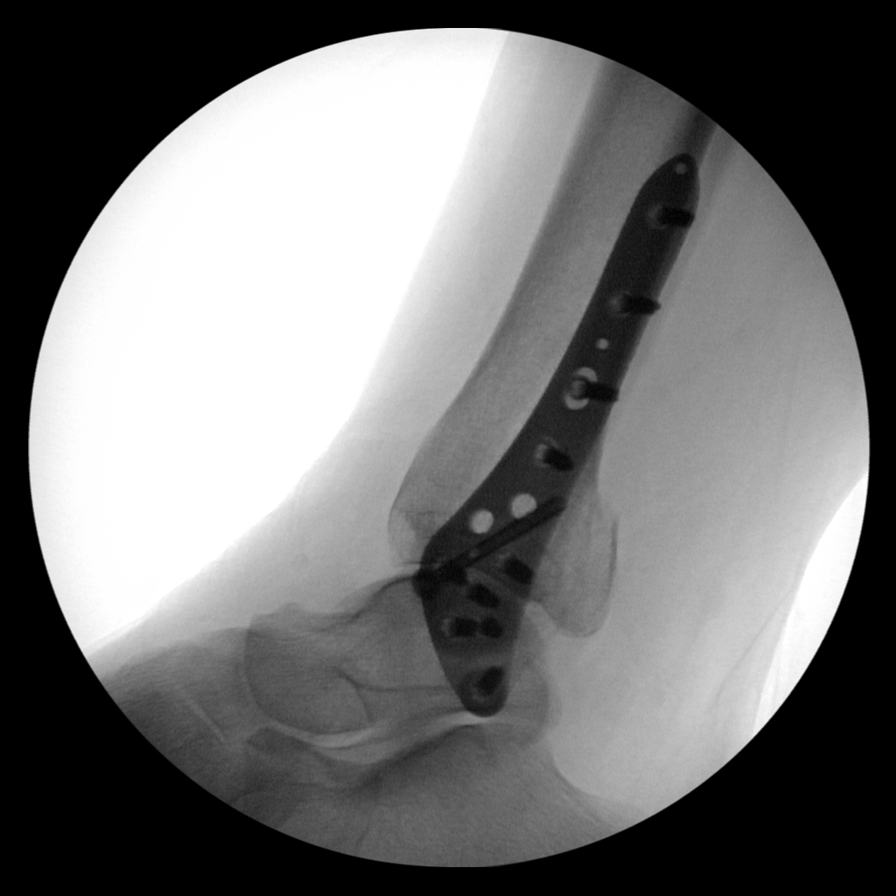

[7 of 7 positions shown; findings below may reference images not displayed]

FINDINGS: Multiple intraoperative images demonstrate plate and screw fixation
along the lateral aspect of the distal fibula across the previously
seen fracture. There is also placement of a screw traversing the
medial malleolus fracture. Near anatomic alignment.
IMPRESSION: Fluoroscopic guidance for ORIF of right ankle fractures.

## 2021-11-20 ENCOUNTER — Other Ambulatory Visit: Payer: Self-pay | Admitting: Oncology

## 2021-11-20 DIAGNOSIS — C50411 Malignant neoplasm of upper-outer quadrant of right female breast: Secondary | ICD-10-CM

## 2021-11-22 ENCOUNTER — Encounter: Payer: Self-pay | Admitting: Hematology and Oncology

## 2021-11-26 ENCOUNTER — Encounter: Payer: Self-pay | Admitting: Oncology

## 2021-11-26 ENCOUNTER — Inpatient Hospital Stay: Payer: Medicare HMO | Attending: Oncology

## 2021-11-26 ENCOUNTER — Inpatient Hospital Stay (HOSPITAL_BASED_OUTPATIENT_CLINIC_OR_DEPARTMENT_OTHER): Payer: Medicare HMO | Admitting: Oncology

## 2021-11-26 ENCOUNTER — Inpatient Hospital Stay: Payer: Medicare HMO

## 2021-11-26 VITALS — BP 149/63 | HR 66 | Temp 96.6°F | Resp 20 | Wt 147.2 lb

## 2021-11-26 DIAGNOSIS — C50411 Malignant neoplasm of upper-outer quadrant of right female breast: Secondary | ICD-10-CM

## 2021-11-26 DIAGNOSIS — Z170 Estrogen receptor positive status [ER+]: Secondary | ICD-10-CM

## 2021-11-26 DIAGNOSIS — Z85850 Personal history of malignant neoplasm of thyroid: Secondary | ICD-10-CM | POA: Insufficient documentation

## 2021-11-26 DIAGNOSIS — Z79899 Other long term (current) drug therapy: Secondary | ICD-10-CM | POA: Insufficient documentation

## 2021-11-26 DIAGNOSIS — Z87891 Personal history of nicotine dependence: Secondary | ICD-10-CM | POA: Insufficient documentation

## 2021-11-26 DIAGNOSIS — M8588 Other specified disorders of bone density and structure, other site: Secondary | ICD-10-CM

## 2021-11-26 DIAGNOSIS — Z79811 Long term (current) use of aromatase inhibitors: Secondary | ICD-10-CM | POA: Insufficient documentation

## 2021-11-26 DIAGNOSIS — Z9089 Acquired absence of other organs: Secondary | ICD-10-CM | POA: Insufficient documentation

## 2021-11-26 DIAGNOSIS — C73 Malignant neoplasm of thyroid gland: Secondary | ICD-10-CM

## 2021-11-26 DIAGNOSIS — F0390 Unspecified dementia without behavioral disturbance: Secondary | ICD-10-CM | POA: Insufficient documentation

## 2021-11-26 DIAGNOSIS — M8580 Other specified disorders of bone density and structure, unspecified site: Secondary | ICD-10-CM | POA: Diagnosis not present

## 2021-11-26 DIAGNOSIS — Z923 Personal history of irradiation: Secondary | ICD-10-CM | POA: Diagnosis not present

## 2021-11-26 DIAGNOSIS — Z79890 Hormone replacement therapy (postmenopausal): Secondary | ICD-10-CM | POA: Diagnosis not present

## 2021-11-26 LAB — CBC WITH DIFFERENTIAL/PLATELET
Abs Immature Granulocytes: 0.02 10*3/uL (ref 0.00–0.07)
Basophils Absolute: 0 10*3/uL (ref 0.0–0.1)
Basophils Relative: 1 %
Eosinophils Absolute: 0.1 10*3/uL (ref 0.0–0.5)
Eosinophils Relative: 1 %
HCT: 42.8 % (ref 36.0–46.0)
Hemoglobin: 14.1 g/dL (ref 12.0–15.0)
Immature Granulocytes: 0 %
Lymphocytes Relative: 37 %
Lymphs Abs: 1.8 10*3/uL (ref 0.7–4.0)
MCH: 28.9 pg (ref 26.0–34.0)
MCHC: 32.9 g/dL (ref 30.0–36.0)
MCV: 87.7 fL (ref 80.0–100.0)
Monocytes Absolute: 0.4 10*3/uL (ref 0.1–1.0)
Monocytes Relative: 8 %
Neutro Abs: 2.5 10*3/uL (ref 1.7–7.7)
Neutrophils Relative %: 53 %
Platelets: 237 10*3/uL (ref 150–400)
RBC: 4.88 MIL/uL (ref 3.87–5.11)
RDW: 14.1 % (ref 11.5–15.5)
WBC: 4.8 10*3/uL (ref 4.0–10.5)
nRBC: 0 % (ref 0.0–0.2)

## 2021-11-26 LAB — COMPREHENSIVE METABOLIC PANEL
ALT: 15 U/L (ref 0–44)
AST: 18 U/L (ref 15–41)
Albumin: 4.4 g/dL (ref 3.5–5.0)
Alkaline Phosphatase: 32 U/L — ABNORMAL LOW (ref 38–126)
Anion gap: 7 (ref 5–15)
BUN: 13 mg/dL (ref 8–23)
CO2: 26 mmol/L (ref 22–32)
Calcium: 9.2 mg/dL (ref 8.9–10.3)
Chloride: 102 mmol/L (ref 98–111)
Creatinine, Ser: 0.65 mg/dL (ref 0.44–1.00)
GFR, Estimated: >60 mL/min (ref >60)
Glucose, Bld: 104 mg/dL — ABNORMAL HIGH (ref 70–99)
Potassium: 4.5 mmol/L (ref 3.5–5.1)
Sodium: 135 mmol/L (ref 135–145)
Total Bilirubin: 0.4 mg/dL (ref 0.3–1.2)
Total Protein: 7.8 g/dL (ref 6.5–8.1)

## 2021-11-26 MED ORDER — DENOSUMAB 60 MG/ML ~~LOC~~ SOSY
60.0000 mg | PREFILLED_SYRINGE | Freq: Once | SUBCUTANEOUS | Status: AC
  Administered 2021-11-26: 60 mg via SUBCUTANEOUS
  Filled 2021-11-26: qty 1

## 2021-11-26 NOTE — Assessment & Plan Note (Signed)
Stage III thyroid carcinoma, s/p thyroidectomy 02/2017.s/p I-131. Thyroid ultrasound on 08/06/2018 revealed no residual disease. She follows up with Dr. [name].

## 2021-11-26 NOTE — Assessment & Plan Note (Signed)
#  01/14/2020 Stage IA right breast cancer, s/p partial mastectomy, ER/PR+ and Her2/neu-. Oncotype DX 16 no adjuvant chemotherapy-s/p radiation -Letrozole since 05/11/2020.   Labs reviewed and discussed with patient Patient tolerates letrozole very well.  Refills sent Annual bilateral diagnostic mammogram in October 2023.-She gets mammograms through Dr. Pabon's office.  

## 2021-11-26 NOTE — Progress Notes (Signed)
Hematology/Oncology Progress note [phone] [phone]      Clinic Day:  11/26/2021  ASSESSMENT & PLAN:   Cancer Staging  Malignant neoplasm of female breast Tanya Frey) Staging form: Breast, AJCC 8th Edition - Clinical stage from 01/14/2020: Stage IA (cT1b, cN0(sn), cM0, G1, ER+, PR+, HER2-) - Signed by Lequita Asal, MD on 04/05/2020   Malignant neoplasm of female breast Tanya Frey) # 01/14/2020 Stage IA right breast cancer, s/p partial mastectomy, ER/PR+ and Her2/neu-. Oncotype DX 16 no adjuvant chemotherapy-s/p radiation -Letrozole since 05/11/2020.   Labs reviewed and discussed with patient Patient tolerates letrozole very well.  Refills sent Annual bilateral diagnostic mammogram in October 2023.-She gets mammograms through Dr. Corlis Leak office.   Osteopenia of spine #Osteopenia  04/02/2020 Bone density [[facility]]  on  revealed progressive osteopenia with a T score of -1.5 in the lumbar spine L1-L4. Recommend patient to repeat DEXA in Feb 2024, patient will ask her PCP to order Continue calcium and vitamin D. Prolia Q27m proceed with Prolia today.    Thyroid cancer (Tanya Frey  Stage III thyroid carcinoma, s/p thyroidectomy 02/2017.s/p I-131. Thyroid ultrasound on 08/06/2018 revealed no residual disease. She follows up with Dr. SGabriel Carina   Orders Placed This Encounter  Procedures   CBC with Differential/Platelet    Standing Status:   Future    Standing Expiration Date:   11/27/2022   Comprehensive metabolic panel    Standing Status:   Future    Standing Expiration Date:   11/26/2022    All questions were answered. The patient knows to call the clinic with any problems, questions or concerns.  Tanya Server MD, PhD [facility]cology 11/26/2021    Chief Complaint: Tanya GOONANis a 76y.o. female presents for stage III papillary carcinoma thyroid and stage IA right breast cancer   PERTINENT ONCOLOGY HISTORY Patient previously followed up by  Dr.[name], patient switched care to me on 08/27/20 Extensive medical record review was performed by me   # Stage III papillary carcinoma thyroid carcinoma s/p thyroidectomy in 02/21/2017.  Pathology revealed a 4.6 cm papillary thyroid carcinoma with extrathyroidal extension.  There was angioinvasion but no lymphatic invasion.  Margins were negative.  Pathologic stage was pT4a Nx (stage III).  Soft tissue neck CT on 01/11/2017 revealed a 5 cm complex cystic and solid mass arising from the right lobe of the thyroid compatible with known carcinoma. There was no malignant adenopathy.She received 130.6 mCi I-131 with Thyrogen stimulation on 04/26/2017.  Whole body I-131 scan on 05/04/2017 revealed uptake at thyroid bed consistent with thyroid remnant.  There was no scintigraphic evidence of iodine-avid metastatic thyroid cancer.  Thyroid ultrasound on 08/06/2018 revealed no residual or recurrent tissue post thyroidectomy. She takes Synthroid 112 mcg daily. She has chronic swallowing issues post surgery.    # stage IA right breast cancer s/p partial mastectomy on 01/14/2020.  Pathology revealed a 7 mm grade 1 invasive mammary carcinoma (NOS, ductal).  There was a separate focus of DCIS and intraductal papilloma.  DCIS margins were 3 mm; invasive carcinoma margins were 6 mm.  One sentinel lymph node was negative.  Tumor was ER+ (>90%), PR+ (> 90%), and HER-2 negative (score 0).  Pathologic stage was pT1bpN0.  CA27.29 was 16.3 on 01/07/2020.  Oncotype DX testing revealed a recurrence score of 16 which translated to a risk of distant recurrence at 9 years of 15% (95% CI 10-19%) and no benefit of chemotherapy.  She received radiation to the right breast from 02/25/2020 -  03/27/2020. She received 42.56 Gy in 16 fractions. She received a Boost of 10 Gy in 5 fractions.  05/11/2020.Started on Letrozole   # Osteopenia  Bone density on 07/05/2016 revealed osteopenia with a T-score of -1.5 in L1-L4 and -13 in  the left femoral neck.  Bone density on 08/15/2018 revealed osteopenia in the left femur neck with a T-score of -1.1 in the left femoral neck.  Bone density on 04/02/2020 ([facility]) revealed osteopenia with a T score of -1.5 in the lumbar spine L1-L4.  She continued calcium and vitamin D.  She began Prolia on 05/06/2020.   INTERVAL HISTORY Tanya Frey is a 76 y.o. female who has above history reviewed by me today presents for follow up visit for history of breast cancer, osteopenia, and history of thyroid cancer.  Patient was accompanied by her husband.  She has dementia. Patient is on letrozole, she tolerates well.  Denies any side effects.  Review of Systems  Constitutional:  Negative for appetite change, chills, fatigue and fever.  HENT:   Negative for hearing loss and voice change.   Eyes:  Negative for eye problems.  Respiratory:  Negative for chest tightness and cough.   Cardiovascular:  Negative for chest pain.  Gastrointestinal:  Negative for abdominal distention, abdominal pain and blood in stool.  Endocrine: Negative for hot flashes.  Genitourinary:  Negative for difficulty urinating and frequency.   Musculoskeletal:  Negative for arthralgias.  Skin:  Negative for itching and rash.  Neurological:  Negative for extremity weakness.  Hematological:  Negative for adenopathy.  Psychiatric/Behavioral:         Memory loss     Past Medical History:  Diagnosis Date   Anemia    Breast cancer ([place])    Breast cancer in female [facility]) 12/27/2019   Right breast   Difficult intubation    Diffuse cystic mastopathy    Family history of malignant neoplasm of breast    Hyperlipidemia    Obesity, unspecified    Personal history of tobacco use, presenting hazards to health    Thyroid cancer ([place])    T4a, NX: 4.6 cm lesion with extrathyroidal extension. Received I 131 post procedure.     Varicose veins     Past Surgical History:  Procedure Laterality Date   BREAST LUMPECTOMY,RADIO  FREQ LOCALIZER,AXILLARY SENTINEL LYMPH NODE BIOPSY Right 01/14/2020   Procedure: BREAST LUMPECTOMY,RADIO FREQ LOCALIZER,AXILLARY SENTINEL LYMPH NODE BIOPSY;  Surgeon: Jules Husbands, MD;  Location: [facility];  Service: General;  Laterality: Right;   BREAST SURGERY     COLONOSCOPY  2011   Dr. [name]; colon polyps (tubular adenoma)   COLONOSCOPY N/A    COLONOSCOPY WITH PROPOFOL N/A 10/10/2014   Procedure: COLONOSCOPY WITH PROPOFOL;  Surgeon: [name], MD;  Location: [facility];  Service: Endoscopy;  Laterality: N/A;   COLONOSCOPY WITH PROPOFOL N/A 08/05/2020   Procedure: COLONOSCOPY WITH PROPOFOL;  Surgeon: Robert Bellow, MD;  Location: [facility];  Service: Endoscopy;  Laterality: N/A;   FRACTURE SURGERY     ORIF ANKLE FRACTURE Right 03/16/2020   Procedure: OPEN REDUCTION INTERNAL FIXATION (ORIF) ANKLE FRACTURE;  Surgeon: [name], MD;  Location: [facility];  Service: Orthopedics;  Laterality: Right;   THYROID SURGERY Bilateral    THYROIDECTOMY N/A 02/21/2017   Procedure: THYROIDECTOMY;  Surgeon: [name], MD;  Location: [facility];  Service: ENT;  Laterality: N/A;   TUBAL LIGATION     VARICOSE VEIN SURGERY     vein closure  procedure Right 2009    Family History  Problem Relation Age of Onset   Breast cancer Daughter 82       [name] BRCA negative    Social History:  reports that she quit smoking about 31 years ago. Her smoking use included cigarettes. She has a 10.00 pack-year smoking history. She has never used smokeless tobacco. She reports current alcohol use of about 1.0 - 4.0 standard drink of alcohol per week. She reports that she does not use drugs.   Allergies:  Allergies  Allergen Reactions   Cefdinir Rash    Pt tolerated exposure to cefazolin   Protonix [Pantoprazole Sodium] Rash    Current Medications: Current Outpatient Medications  Medication Sig Dispense Refill   Apoaequorin (PREVAGEN) 10 MG CAPS Take 1 capsule by mouth daily.      aspirin EC 81 MG tablet Take 81 mg by mouth daily. Swallow whole.     Calcium-Magnesium-Vitamin D (CALCIUM 1200+D3 PO) Take 1,200 mg by mouth daily.      letrozole (FEMARA) 2.5 MG tablet TAKE 1 TABLET(2.5 MG) BY MOUTH DAILY 90 tablet 1   levothyroxine (SYNTHROID) 112 MCG tablet TAKE 1 TABLET(112 MCG) BY MOUTH DAILY BEFORE AND BREAKFAST (Patient taking differently: TAKE 1 TABLET(112 MCG) BY MOUTH DAILY BEFORE AND BREAKFAST) 30 tablet 2   Multiple Vitamins-Minerals (MULTIVITAMIN WITH MINERALS) tablet Take 1 tablet by mouth daily.     simvastatin (ZOCOR) 40 MG tablet Take 40 mg by mouth every evening.      vitamin B-12 (CYANOCOBALAMIN) 1000 MCG tablet Take 1,000 mcg by mouth daily.     No current facility-administered medications for this visit.      Performance status (ECOG):  1  Vitals: Blood pressure (!) 149/63, pulse 66, temperature (!) 96.6 F (35.9 C), resp. rate 20, weight 147 lb 3.2 oz (66.8 kg), SpO2 100 %.   Physical Exam Vitals reviewed.  Constitutional:      General: She is not in acute distress.    Appearance: She is well-developed. She is not diaphoretic.  HENT:     Head: Normocephalic and atraumatic.     Mouth/Throat:     Mouth: Mucous membranes are moist.     Pharynx: Oropharynx is clear. No oropharyngeal exudate.  Eyes:     General: No scleral icterus.    Extraocular Movements: Extraocular movements intact.     Conjunctiva/sclera: Conjunctivae normal.     Pupils: Pupils are equal, round, and reactive to light.  Neck:     Vascular: No JVD.  Cardiovascular:     Rate and Rhythm: Normal rate and regular rhythm.     Heart sounds: Normal heart sounds. No murmur heard. Pulmonary:     Effort: Pulmonary effort is normal. No respiratory distress.     Breath sounds: Normal breath sounds. No wheezing or rales.  Chest:     Chest wall: No tenderness.  Breasts:    Right: Skin change (radiation changes) present. No swelling, bleeding, mass or tenderness.     Left: Skin  change (fibrocystic changes superiorly) present. No swelling, bleeding, mass or tenderness.  Abdominal:     General: Bowel sounds are normal. There is no distension.     Palpations: Abdomen is soft. There is no mass.     Tenderness: There is no abdominal tenderness. There is no guarding or rebound.  Musculoskeletal:        General: No tenderness. Normal range of motion.     Cervical back: Normal range of motion and  neck supple.  Lymphadenopathy:     Head:     Right side of head: No preauricular, posterior auricular or occipital adenopathy.     Left side of head: No preauricular, posterior auricular or occipital adenopathy.     Cervical: No cervical adenopathy.     Upper Body:     Right upper body: No supraclavicular adenopathy.     Left upper body: No supraclavicular adenopathy.     Lower Body: No right inguinal adenopathy. No left inguinal adenopathy.  Skin:    General: Skin is warm and dry.     Coloration: Skin is not pale.     Findings: No erythema or rash.  Neurological:     Mental Status: She is alert and oriented to person, place, and time.  Psychiatric:        Behavior: Behavior normal.        Thought Content: Thought content normal.        Judgment: Judgment normal.    Labs    Latest Ref Rng & Units 11/26/2021   11:21 AM 05/18/2021    1:11 PM 11/17/2020    1:50 PM  CBC  WBC 4.0 - 10.5 K/uL 4.8  4.9  5.5   Hemoglobin 12.0 - 15.0 g/dL 14.1  13.9  13.0   Hematocrit 36.0 - 46.0 % 42.8  41.8  38.2   Platelets 150 - 400 K/uL 237  220  215       Latest Ref Rng & Units 11/26/2021   11:21 AM 05/18/2021    1:11 PM 11/17/2020    1:50 PM  CMP  Glucose 70 - 99 mg/dL 104  102  97   BUN 8 - 23 mg/dL '13  15  17   ' Creatinine 0.44 - 1.00 mg/dL 0.65  0.63  0.73   Sodium 135 - 145 mmol/L 135  137  132   Potassium 3.5 - 5.1 mmol/L 4.5  4.1  4.3   Chloride 98 - 111 mmol/L 102  100  101   CO2 22 - 32 mmol/L '26  27  25   ' Calcium 8.9 - 10.3 mg/dL 9.2  9.2  8.8   Total Protein 6.5  - 8.1 g/dL 7.8  7.0  7.1   Total Bilirubin 0.3 - 1.2 mg/dL 0.4  0.6  0.2   Alkaline Phos 38 - 126 U/L 32  30  38   AST 15 - 41 U/L '18  20  19   ' ALT 0 - 44 U/L 15  15  17

## 2021-11-26 NOTE — Assessment & Plan Note (Signed)
#  Osteopenia  04/02/2020 Bone density [[facility]]  on  revealed progressive osteopenia with a T score of -1.5 in the lumbar spine L1-L4. Recommend patient to repeat DEXA in Feb 2024, patient will ask her PCP to order Continue calcium and vitamin D. Prolia Q8m proceed with Prolia today.

## 2021-11-30 ENCOUNTER — Telehealth: Payer: Self-pay | Admitting: *Deleted

## 2021-11-30 NOTE — Telephone Encounter (Signed)
Mr Panik called stating that patient had her "bone shot" (Prolia last week 11/26/21 nad that she has an appointment with dentist for a cleaning the first week of November. He is asking if they need to post pone her dental cleaning or if she can keep her appointment since it has not been 6 weeks since her injection. Please advise

## 2021-11-30 NOTE — Telephone Encounter (Signed)
Call returned to Tanya Frey and advised per Dr Tasia Catchings ok to have regular gentle cleaning, nothing invasive or no deep cleaning

## 2021-12-06 ENCOUNTER — Ambulatory Visit
Admission: RE | Admit: 2021-12-06 | Discharge: 2021-12-06 | Disposition: A | Discharge status: Home / Self Care | Payer: Medicare HMO | Source: Ambulatory Visit | Attending: Surgery | Admitting: Surgery

## 2021-12-06 DIAGNOSIS — Z853 Personal history of malignant neoplasm of breast: Secondary | ICD-10-CM | POA: Diagnosis not present

## 2021-12-06 DIAGNOSIS — Z170 Estrogen receptor positive status [ER+]: Secondary | ICD-10-CM

## 2021-12-06 DIAGNOSIS — C50411 Malignant neoplasm of upper-outer quadrant of right female breast: Secondary | ICD-10-CM | POA: Insufficient documentation

## 2021-12-13 ENCOUNTER — Ambulatory Visit (INDEPENDENT_AMBULATORY_CARE_PROVIDER_SITE_OTHER): Payer: Medicare HMO | Admitting: Surgery

## 2021-12-13 ENCOUNTER — Encounter: Payer: Self-pay | Admitting: Surgery

## 2021-12-13 VITALS — BP 159/81 | HR 68 | Temp 97.9°F | Ht 66.0 in | Wt 147.4 lb

## 2021-12-13 DIAGNOSIS — Z853 Personal history of malignant neoplasm of breast: Secondary | ICD-10-CM

## 2021-12-13 DIAGNOSIS — C50411 Malignant neoplasm of upper-outer quadrant of right female breast: Secondary | ICD-10-CM

## 2021-12-13 NOTE — Patient Instructions (Signed)
If you have any concerns or questions, please feel free to call our office. We will see you back in 1 year.   Breast Self-Awareness Breast self-awareness is knowing how your breasts look and feel. You need to: Check your breasts on a regular basis. Tell your doctor about any changes. Become familiar with the look and feel of your breasts. This can help you catch a breast problem while it is still small and can be treated. You should do breast self-exams even if you have breast implants. What you need: A mirror. A well-lit room. A pillow or other soft object. How to do a breast self-exam Follow these steps to do a breast self-exam: Look for changes  Take off all the clothes above your waist. Stand in front of a mirror in a room with good lighting. Put your hands down at your sides. Compare your breasts in the mirror. Look for any difference between them, such as: A difference in shape. A difference in size. Wrinkles, dips, and bumps in one breast and not the other. Look at each breast for changes in the skin, such as: Redness. Scaly areas. Skin that has gotten thicker. Dimpling. Open sores (ulcers). Look for changes in your nipples, such as: Fluid coming out of a nipple. Fluid around a nipple. Bleeding. Dimpling. Redness. A nipple that looks pushed in (retracted), or that has changed position. Feel for changes Lie on your back. Feel each breast. To do this: Pick a breast to feel. Place a pillow under the shoulder closest to that breast. Put the arm closest to that breast behind your head. Feel the nipple area of that breast using the hand of your other arm. Feel the area with the pads of your three middle fingers by making small circles with your fingers. Use light, medium, and firm pressure. Continue the overlapping circles, moving downward over the breast. Keep making circles with your fingers. Stop when you feel your ribs. Start making circles with your fingers again, this  time going upward until you reach your collarbone. Then, make circles outward across your breast and into your armpit area. Squeeze your nipple. Check for discharge and lumps. Repeat these steps to check your other breast. Sit or stand in the tub or shower. With soapy water on your skin, feel each breast the same way you did when you were lying down. Write down what you find Writing down what you find can help you remember what to tell your doctor. Write down: What is normal for each breast. Any changes you find in each breast. These include: The kind of changes you find. A tender or painful breast. Any lump you find. Write down its size and where it is. When you last had your monthly period (menstrual cycle). General tips If you are breastfeeding, the best time to check your breasts is after you feed your baby or after you use a breast pump. If you get monthly bleeding, the best time to check your breasts is 5-7 days after your monthly cycle ends. With time, you will become comfortable with the self-exam. You will also start to know if there are changes in your breasts. Contact a doctor if: You see a change in the shape or size of your breasts or nipples. You see a change in the skin of your breast or nipples, such as red or scaly skin. You have fluid coming from your nipples that is not normal. You find a new lump or thick area. You have  breast pain. You have any concerns about your breast health. Summary Breast self-awareness includes looking for changes in your breasts and feeling for changes within your breasts. You should do breast self-awareness in front of a mirror in a well-lit room. If you get monthly periods (menstrual cycles), the best time to check your breasts is 5-7 days after your period ends. Tell your doctor about any changes you see in your breasts. Changes include changes in size, changes on the skin, painful or tender breasts, or fluid from your nipples that is not  normal. This information is not intended to replace advice given to you by your health care provider. Make sure you discuss any questions you have with your health care provider. Document Revised: 07/01/2021 Document Reviewed: 11/26/2020 Elsevier Patient Education  [place].

## 2021-12-13 NOTE — Progress Notes (Signed)
Outpatient Surgical Follow Up  12/13/2021  Tanya Frey is an 76 y.o. female.   Chief Complaint  Patient presents with   Follow-up    1 year f/u mammo 12/06/2021    [id number] is a 76 year old female right breast outer upper quadrant cancer ER/PR positive HER-2 negative.  underwent uneventful lumpectomy with negative margins 01/2020 by Me.  Negative nodes.  finished radiation therapy with some skin radiation changes on the right breast. AShe also had prior lumpectomy on the LEFT side for benign disease. Regarding her breast she has no complaints other just mild soreness after radiation therapy and discoloration of the right breast.  No discharge no fevers no chills no weight loss.   Recent mammogram personally reviewed showing no evidence of suspicious lesions. She was sore and experienced some intermittent dull breast pain after mammo, now is getting better.      Past Medical History:  Diagnosis Date   Anemia    Breast cancer ([place])    Breast cancer in female [facility]) 12/27/2019   Right breast   Difficult intubation    Diffuse cystic mastopathy    Family history of malignant neoplasm of breast    Hyperlipidemia    Obesity, unspecified    Personal history of tobacco use, presenting hazards to health    Thyroid cancer ([place])    T4a, NX: 4.6 cm lesion with extrathyroidal extension. Received I 131 post procedure.     Varicose veins     Past Surgical History:  Procedure Laterality Date   BREAST LUMPECTOMY,RADIO FREQ LOCALIZER,AXILLARY SENTINEL LYMPH NODE BIOPSY Right 01/14/2020   Procedure: BREAST LUMPECTOMY,RADIO FREQ LOCALIZER,AXILLARY SENTINEL LYMPH NODE BIOPSY;  Surgeon: Jules Husbands, MD;  Location: [facility];  Service: General;  Laterality: Right;   BREAST SURGERY     COLONOSCOPY  2011   Dr. [name]; colon polyps (tubular adenoma)   COLONOSCOPY N/A    COLONOSCOPY WITH PROPOFOL N/A 10/10/2014   Procedure: COLONOSCOPY WITH PROPOFOL;  Surgeon: [name], MD;   Location: [facility];  Service: Endoscopy;  Laterality: N/A;   COLONOSCOPY WITH PROPOFOL N/A 08/05/2020   Procedure: COLONOSCOPY WITH PROPOFOL;  Surgeon: Robert Bellow, MD;  Location: [facility];  Service: Endoscopy;  Laterality: N/A;   FRACTURE SURGERY     ORIF ANKLE FRACTURE Right 03/16/2020   Procedure: OPEN REDUCTION INTERNAL FIXATION (ORIF) ANKLE FRACTURE;  Surgeon: [name], MD;  Location: [facility];  Service: Orthopedics;  Laterality: Right;   THYROID SURGERY Bilateral    THYROIDECTOMY N/A 02/21/2017   Procedure: THYROIDECTOMY;  Surgeon: [name], MD;  Location: [facility];  Service: ENT;  Laterality: N/A;   TUBAL LIGATION     VARICOSE VEIN SURGERY     vein closure procedure Right 2009    Family History  Problem Relation Age of Onset   Breast cancer Daughter 17       [name] BRCA negative    Social History:  reports that she quit smoking about 31 years ago. Her smoking use included cigarettes. She has a 10.00 pack-year smoking history. She has never used smokeless tobacco. She reports current alcohol use of about 1.0 - 4.0 standard drink of alcohol per week. She reports that she does not use drugs.  Allergies:  Allergies  Allergen Reactions   Cefdinir Rash    Pt tolerated exposure to cefazolin   Protonix [Pantoprazole Sodium] Rash    Medications reviewed.    ROS Full ROS performed and is otherwise negative other than what  is stated in HPI   BP (!) 159/81   Pulse 68   Temp 97.9 F (36.6 C) (Oral)   Ht _0  (1.676 m)   Wt 147 lb 6.4 oz (66.9 kg)   SpO2 97%   BMI 23.79 kg/m   Physical Exam Vitals and nursing note reviewed. Exam conducted with a chaperone present.  Constitutional:      General: She is not in acute distress.    Appearance: Normal appearance. She is normal weight.  Eyes:     General:        Right eye: No discharge.        Left eye: No discharge.  Pulmonary:     Effort: Pulmonary effort is normal. No respiratory  distress.     Breath sounds: Normal breath sounds.     Comments: BREAST: Lumpectomy scars bilaterally.   Evidence of radiation changes to the right breast/there is no evidence of infection there is no evidence of new masses seromas or complications.  Left breast completely normal. No major deformities Abdominal:     General: Abdomen is flat. There is no distension.     Palpations: Abdomen is soft. There is no mass.  Musculoskeletal:        General: No swelling or tenderness. Normal range of motion.     Cervical back: Normal range of motion and neck supple. No rigidity or tenderness.  Skin:    Capillary Refill: Capillary refill takes less than 2 seconds.  Neurological:     General: No focal deficit present.     Mental Status: She is alert and oriented to person, place, and time.  Psychiatric:        Mood and Affect: Mood normal.        Behavior: Behavior normal.        Thought Content: Thought content normal.        Judgment: Judgment normal.    Assessment/Plan: 76 year old female history of breast cancer status postradiation and lumpectomy 2021 doing very well No evidence of recurrence on Letrozole .  I will see her back in 1 year with follow-up mammogram and physical exam.  Please note that I spent 30 minutes in this encounter including pers. Reviewing images, reviewing records, counseling the pt and performing appropriate documentation.    Caroleen Hamman, MD [facility] General Surgeon

## 2022-03-21 DIAGNOSIS — Z85850 Personal history of malignant neoplasm of thyroid: Secondary | ICD-10-CM | POA: Diagnosis not present

## 2022-03-21 DIAGNOSIS — E890 Postprocedural hypothyroidism: Secondary | ICD-10-CM | POA: Diagnosis not present

## 2022-03-29 DIAGNOSIS — E890 Postprocedural hypothyroidism: Secondary | ICD-10-CM | POA: Diagnosis not present

## 2022-03-29 DIAGNOSIS — Z85850 Personal history of malignant neoplasm of thyroid: Secondary | ICD-10-CM | POA: Diagnosis not present

## 2022-04-14 ENCOUNTER — Encounter: Payer: Self-pay | Admitting: Radiation Oncology

## 2022-04-14 ENCOUNTER — Ambulatory Visit
Admission: RE | Admit: 2022-04-14 | Discharge: 2022-04-14 | Disposition: A | Discharge status: Home / Self Care | Payer: Medicare HMO | Source: Ambulatory Visit | Attending: Radiation Oncology | Admitting: Radiation Oncology

## 2022-04-14 VITALS — BP 117/72 | HR 61 | Temp 96.0°F | Resp 14 | Ht 65.0 in | Wt 147.1 lb

## 2022-04-14 DIAGNOSIS — Z170 Estrogen receptor positive status [ER+]: Secondary | ICD-10-CM | POA: Diagnosis not present

## 2022-04-14 DIAGNOSIS — C50411 Malignant neoplasm of upper-outer quadrant of right female breast: Secondary | ICD-10-CM | POA: Insufficient documentation

## 2022-04-14 DIAGNOSIS — Z923 Personal history of irradiation: Secondary | ICD-10-CM | POA: Insufficient documentation

## 2022-04-14 DIAGNOSIS — Z79811 Long term (current) use of aromatase inhibitors: Secondary | ICD-10-CM | POA: Insufficient documentation

## 2022-04-14 NOTE — Progress Notes (Signed)
Radiation Oncology Follow up Note  Name: Tanya Frey   Date:   04/14/2022 MRN:  [id number] DOB: 03-26-1945    This 77 y.o. female presents to the clinic today for 2-year follow-up status post whole breast radiation to her right breast for stage I ER/PR positive invasive mammary carcinoma.  REFERRING PROVIDER: [name], MD  HPI: Patient is a 77 year old female now out 2 years having completed whole breast radiation to her right breast for stage I ER/PR positive invasive mammary carcinoma seen today in routine follow-up she is doing well.  She specifically denies breast tenderness cough or bone pain..  She had mammograms back in October which I have reviewed were BI-RADS 2 benign.  She is currently on Femara tolerating it well without side effect.  COMPLICATIONS OF TREATMENT: none  FOLLOW UP COMPLIANCE: keeps appointments   PHYSICAL EXAM:  BP 117/72   Pulse 61   Temp (!) 96 F (35.6 C)   Resp 14   Ht '5\' 5"'$  (1.651 m)   Wt 147 lb 1.6 oz (66.7 kg)   BMI 24.48 kg/m  Lungs are clear to A&P cardiac examination essentially unremarkable with regular rate and rhythm. No dominant mass or nodularity is noted in either breast in 2 positions examined. Incision is well-healed. No axillary or supraclavicular adenopathy is appreciated. Cosmetic result is excellent.  Well-developed well-nourished patient in NAD. HEENT reveals PERLA, EOMI, discs not visualized.  Oral cavity is clear. No oral mucosal lesions are identified. Neck is clear without evidence of cervical or supraclavicular adenopathy. Lungs are clear to A&P. Cardiac examination is essentially unremarkable with regular rate and rhythm without murmur rub or thrill. Abdomen is benign with no organomegaly or masses noted. Motor sensory and DTR levels are equal and symmetric in the upper and lower extremities. Cranial nerves II through XII are grossly intact. Proprioception is intact. No peripheral adenopathy or edema is identified. No  motor or sensory levels are noted. Crude visual fields are within normal range.  RADIOLOGY RESULTS: Mammograms reviewed compatible with above-stated findings  PLAN: The present time patient is now at 2 years with no evidence of disease I am pleased with her overall progress.  I have asked to see her back in 1 year and then will discontinue follow-up care.  She continues on Femara without side effect.  Patient is to call with any concerns.  I would like to take this opportunity to thank you for allowing me to participate in the care of your patient.Noreene Filbert, MD

## 2022-05-26 ENCOUNTER — Other Ambulatory Visit: Payer: Self-pay | Admitting: *Deleted

## 2022-05-26 DIAGNOSIS — Z170 Estrogen receptor positive status [ER+]: Secondary | ICD-10-CM

## 2022-05-26 MED ORDER — LETROZOLE 2.5 MG PO TABS: 2.5000 mg | ORAL_TABLET | Freq: Every day | ORAL | 1 refills | Status: DC

## 2022-05-27 ENCOUNTER — Other Ambulatory Visit: Payer: Self-pay

## 2022-05-27 DIAGNOSIS — Z170 Estrogen receptor positive status [ER+]: Secondary | ICD-10-CM

## 2022-05-27 DIAGNOSIS — M8588 Other specified disorders of bone density and structure, other site: Secondary | ICD-10-CM

## 2022-05-27 NOTE — Progress Notes (Unsigned)
cbcb 

## 2022-05-30 ENCOUNTER — Inpatient Hospital Stay: Payer: Medicare HMO | Attending: Radiation Oncology

## 2022-05-30 ENCOUNTER — Inpatient Hospital Stay: Payer: Medicare HMO

## 2022-05-30 ENCOUNTER — Inpatient Hospital Stay: Payer: Medicare HMO | Admitting: Oncology

## 2022-05-30 ENCOUNTER — Encounter: Payer: Self-pay | Admitting: Oncology

## 2022-05-30 VITALS — BP 137/83 | HR 61 | Temp 96.9°F | Resp 18 | Wt 147.6 lb

## 2022-05-30 DIAGNOSIS — Z170 Estrogen receptor positive status [ER+]: Secondary | ICD-10-CM

## 2022-05-30 DIAGNOSIS — C50411 Malignant neoplasm of upper-outer quadrant of right female breast: Secondary | ICD-10-CM

## 2022-05-30 DIAGNOSIS — C73 Malignant neoplasm of thyroid gland: Secondary | ICD-10-CM | POA: Diagnosis not present

## 2022-05-30 DIAGNOSIS — Z803 Family history of malignant neoplasm of breast: Secondary | ICD-10-CM | POA: Diagnosis not present

## 2022-05-30 DIAGNOSIS — F0390 Unspecified dementia without behavioral disturbance: Secondary | ICD-10-CM | POA: Insufficient documentation

## 2022-05-30 DIAGNOSIS — Z79899 Other long term (current) drug therapy: Secondary | ICD-10-CM | POA: Insufficient documentation

## 2022-05-30 DIAGNOSIS — N6011 Diffuse cystic mastopathy of right breast: Secondary | ICD-10-CM | POA: Insufficient documentation

## 2022-05-30 DIAGNOSIS — Z79811 Long term (current) use of aromatase inhibitors: Secondary | ICD-10-CM | POA: Insufficient documentation

## 2022-05-30 DIAGNOSIS — Z87891 Personal history of nicotine dependence: Secondary | ICD-10-CM | POA: Diagnosis not present

## 2022-05-30 DIAGNOSIS — Z881 Allergy status to other antibiotic agents status: Secondary | ICD-10-CM | POA: Diagnosis not present

## 2022-05-30 DIAGNOSIS — Z8719 Personal history of other diseases of the digestive system: Secondary | ICD-10-CM | POA: Insufficient documentation

## 2022-05-30 DIAGNOSIS — M8588 Other specified disorders of bone density and structure, other site: Secondary | ICD-10-CM | POA: Diagnosis not present

## 2022-05-30 DIAGNOSIS — M8589 Other specified disorders of bone density and structure, multiple sites: Secondary | ICD-10-CM | POA: Insufficient documentation

## 2022-05-30 DIAGNOSIS — Z9011 Acquired absence of right breast and nipple: Secondary | ICD-10-CM | POA: Insufficient documentation

## 2022-05-30 LAB — CBC WITH DIFFERENTIAL (CANCER CENTER ONLY)
Abs Immature Granulocytes: 0.02 10*3/uL (ref 0.00–0.07)
Basophils Absolute: 0.1 10*3/uL (ref 0.0–0.1)
Basophils Relative: 1 %
Eosinophils Absolute: 0.1 10*3/uL (ref 0.0–0.5)
Eosinophils Relative: 1 %
HCT: 41.8 % (ref 36.0–46.0)
Hemoglobin: 13.9 g/dL (ref 12.0–15.0)
Immature Granulocytes: 0 %
Lymphocytes Relative: 32 %
Lymphs Abs: 1.8 10*3/uL (ref 0.7–4.0)
MCH: 29.1 pg (ref 26.0–34.0)
MCHC: 33.3 g/dL (ref 30.0–36.0)
MCV: 87.6 fL (ref 80.0–100.0)
Monocytes Absolute: 0.5 10*3/uL (ref 0.1–1.0)
Monocytes Relative: 8 %
Neutro Abs: 3.3 10*3/uL (ref 1.7–7.7)
Neutrophils Relative %: 58 %
Platelet Count: 211 10*3/uL (ref 150–400)
RBC: 4.77 MIL/uL (ref 3.87–5.11)
RDW: 14.4 % (ref 11.5–15.5)
WBC Count: 5.6 10*3/uL (ref 4.0–10.5)
nRBC: 0 % (ref 0.0–0.2)

## 2022-05-30 LAB — CMP (CANCER CENTER ONLY)
ALT: 17 U/L (ref 0–44)
AST: 19 U/L (ref 15–41)
Albumin: 4.1 g/dL (ref 3.5–5.0)
Alkaline Phosphatase: 32 U/L — ABNORMAL LOW (ref 38–126)
Anion gap: 8 (ref 5–15)
BUN: 13 mg/dL (ref 8–23)
CO2: 25 mmol/L (ref 22–32)
Calcium: 8.7 mg/dL — ABNORMAL LOW (ref 8.9–10.3)
Chloride: 99 mmol/L (ref 98–111)
Creatinine: 0.64 mg/dL (ref 0.44–1.00)
GFR, Estimated: >60 mL/min (ref >60)
Glucose, Bld: 108 mg/dL — ABNORMAL HIGH (ref 70–99)
Potassium: 4.1 mmol/L (ref 3.5–5.1)
Sodium: 132 mmol/L — ABNORMAL LOW (ref 135–145)
Total Bilirubin: 0.4 mg/dL (ref 0.3–1.2)
Total Protein: 7.1 g/dL (ref 6.5–8.1)

## 2022-05-30 MED ORDER — DENOSUMAB 60 MG/ML ~~LOC~~ SOSY
60.0000 mg | PREFILLED_SYRINGE | Freq: Once | SUBCUTANEOUS | Status: AC
  Administered 2022-05-30: 60 mg via SUBCUTANEOUS
  Filled 2022-05-30: qty 1

## 2022-05-30 NOTE — Progress Notes (Signed)
Hematology/Oncology Progress note [phone] [phone]      Clinic Day:  05/30/2022  ASSESSMENT & PLAN:   Cancer Staging  Malignant neoplasm of female breast Staging form: Breast, AJCC 8th Edition - Clinical stage from 01/14/2020: Stage IA (cT1b, cN0(sn), cM0, G1, ER+, PR+, HER2-) - Signed by Rosey Bath, MD on 04/05/2020   Malignant neoplasm of female breast # 01/14/2020 Stage IA right breast cancer, s/p partial mastectomy, ER/PR+ and Her2/neu-. Oncotype DX 16 no adjuvant chemotherapy-s/p radiation Labs reviewed and discussed with patient Continue letrozole 2.5mg  daily.  Mammogram result was reviewed.  Annual bilateral diagnostic mammogram in October 2024-She gets mammograms through Dr. Hurman Horn office.   Thyroid cancer (HCC)  Stage III thyroid carcinoma, s/p thyroidectomy 02/2017.s/p I-131. Thyroid ultrasound on 08/06/2018 revealed no residual disease. She follows up with Dr. Tedd Sias.  Osteopenia of spine #Osteopenia  04/02/2020 Bone density [[facility]]  on  revealed progressive osteopenia with a T score of -1.5 in the lumbar spine L1-L4. Recommend patient to repeat DEXA, she is overdue. Previous DEXA done at [facility].  patient's familly will ask her PCP/Dr. Tedd Sias to order Continue calcium and vitamin D. Prolia Q73m, proceed with Prolia today.     No orders of the defined types were placed in this encounter.   All questions were answered. The patient knows to call the clinic with any problems, questions or concerns.  Rickard Patience, MD, PhD [facility] Hematology Oncology 05/30/2022    Chief Complaint: Tanya Frey is a 77 y.o. female presents for stage III papillary carcinoma thyroid and stage IA right breast cancer   PERTINENT ONCOLOGY HISTORY Patient previously followed up by Dr.[name], patient switched care to me on 08/27/20 Extensive medical record review was performed by me   # Stage III papillary carcinoma thyroid carcinoma s/p  thyroidectomy in 02/21/2017.  Pathology revealed a 4.6 cm papillary thyroid carcinoma with extrathyroidal extension.  There was angioinvasion but no lymphatic invasion.  Margins were negative.  Pathologic stage was pT4a Nx (stage III).  Soft tissue neck CT on 01/11/2017 revealed a 5 cm complex cystic and solid mass arising from the right lobe of the thyroid compatible with known carcinoma. There was no malignant adenopathy.She received 130.6 mCi I-131 with Thyrogen stimulation on 04/26/2017.  Whole body I-131 scan on 05/04/2017 revealed uptake at thyroid bed consistent with thyroid remnant.  There was no scintigraphic evidence of iodine-avid metastatic thyroid cancer.  Thyroid ultrasound on 08/06/2018 revealed no residual or recurrent tissue post thyroidectomy. She takes Synthroid 112 mcg daily. She has chronic swallowing issues post surgery.    # stage IA right breast cancer s/p partial mastectomy on 01/14/2020.  Pathology revealed a 7 mm grade 1 invasive mammary carcinoma (NOS, ductal).  There was a separate focus of DCIS and intraductal papilloma.  DCIS margins were 3 mm; invasive carcinoma margins were 6 mm.  One sentinel lymph node was negative.  Tumor was ER+ (>90%), PR+ (> 90%), and HER-2 negative (score 0).  Pathologic stage was pT1bpN0.  CA27.29 was 16.3 on 01/07/2020.  Oncotype DX testing revealed a recurrence score of 16 which translated to a risk of distant recurrence at 9 years of 15% (95% CI 10-19%) and no benefit of chemotherapy.  She received radiation to the right breast from 02/25/2020 - 03/27/2020. She received 42.56 Gy in 16 fractions. She received a Boost of 10 Gy in 5 fractions.  05/11/2020.Started on Letrozole   # Osteopenia  Bone density on 07/05/2016 revealed osteopenia with a T-score  of -1.5 in L1-L4 and -13 in the left femoral neck.  Bone density on 08/15/2018 revealed osteopenia in the left femur neck with a T-score of -1.1 in the left femoral neck.  Bone density on  04/02/2020 ([facility]) revealed osteopenia with a T score of -1.5 in the lumbar spine L1-L4.  She continued calcium and vitamin D.  She began Prolia on 05/06/2020.   INTERVAL HISTORY Tanya Frey is a 77 y.o. female who has above history reviewed by me today presents for follow up visit for history of breast cancer, osteopenia, and history of thyroid cancer.  Patient was accompanied by her husband.  She has dementia. Patient is on letrozole, she tolerates well.  She denies any breast concerns.  Denies any significnat side effects from letrozole. .  Review of Systems  Constitutional:  Negative for appetite change, chills, fatigue and fever.  HENT:   Negative for hearing loss and voice change.   Eyes:  Negative for eye problems.  Respiratory:  Negative for chest tightness and cough.   Cardiovascular:  Negative for chest pain.  Gastrointestinal:  Negative for abdominal distention, abdominal pain and blood in stool.  Endocrine: Negative for hot flashes.  Genitourinary:  Negative for difficulty urinating and frequency.   Musculoskeletal:  Negative for arthralgias.  Skin:  Negative for itching and rash.  Neurological:  Negative for extremity weakness.  Hematological:  Negative for adenopathy.  Psychiatric/Behavioral:         Memory loss     Past Medical History:  Diagnosis Date   Anemia    Breast cancer    Breast cancer in female 12/27/2019   Right breast   Difficult intubation    Diffuse cystic mastopathy    Family history of malignant neoplasm of breast    Hyperlipidemia    Obesity, unspecified    Personal history of tobacco use, presenting hazards to health    Thyroid cancer    T4a, NX: 4.6 cm lesion with extrathyroidal extension. Received I 131 post procedure.     Varicose veins     Past Surgical History:  Procedure Laterality Date   BREAST LUMPECTOMY,RADIO FREQ LOCALIZER,AXILLARY SENTINEL LYMPH NODE BIOPSY Right 01/14/2020   Procedure: BREAST LUMPECTOMY,RADIO FREQ  LOCALIZER,AXILLARY SENTINEL LYMPH NODE BIOPSY;  Surgeon: Leafy Ro, MD;  Location: [facility] ORS;  Service: General;  Laterality: Right;   BREAST SURGERY     COLONOSCOPY  2011   Dr. Mechele Collin; colon polyps (tubular adenoma)   COLONOSCOPY N/A    COLONOSCOPY WITH PROPOFOL N/A 10/10/2014   Procedure: COLONOSCOPY WITH PROPOFOL;  Surgeon: Scot Jun, MD;  Location: [facility] ENDOSCOPY;  Service: Endoscopy;  Laterality: N/A;   COLONOSCOPY WITH PROPOFOL N/A 08/05/2020   Procedure: COLONOSCOPY WITH PROPOFOL;  Surgeon: Earline Mayotte, MD;  Location: [facility] ENDOSCOPY;  Service: Endoscopy;  Laterality: N/A;   FRACTURE SURGERY     ORIF ANKLE FRACTURE Right 03/16/2020   Procedure: OPEN REDUCTION INTERNAL FIXATION (ORIF) ANKLE FRACTURE;  Surgeon: Christena Flake, MD;  Location: [facility] ORS;  Service: Orthopedics;  Laterality: Right;   THYROID SURGERY Bilateral    THYROIDECTOMY N/A 02/21/2017   Procedure: THYROIDECTOMY;  Surgeon: Linus Salmons, MD;  Location: [facility] ORS;  Service: ENT;  Laterality: N/A;   TUBAL LIGATION     VARICOSE VEIN SURGERY     vein closure procedure Right 2009    Family History  Problem Relation Age of Onset   Breast cancer Daughter 63       Dirk Dress BRCA  negative    Social History:  reports that she quit smoking about 32 years ago. Her smoking use included cigarettes. She has a 10.00 pack-year smoking history. She has never used smokeless tobacco. She reports current alcohol use of about 1.0 - 4.0 standard drink of alcohol per week. She reports that she does not use drugs.   Allergies:  Allergies  Allergen Reactions   Cefdinir Rash    Pt tolerated exposure to cefazolin   Protonix [Pantoprazole Sodium] Rash    Current Medications: Current Outpatient Medications  Medication Sig Dispense Refill   Apoaequorin (PREVAGEN) 10 MG CAPS Take 1 capsule by mouth daily.     aspirin EC 81 MG tablet Take 81 mg by mouth daily. Swallow whole.     Calcium-Magnesium-Vitamin D (CALCIUM  1200+D3 PO) Take 1,200 mg by mouth daily.      letrozole (FEMARA) 2.5 MG tablet Take 1 tablet (2.5 mg total) by mouth daily. 90 tablet 1   levothyroxine (SYNTHROID) 112 MCG tablet TAKE 1 TABLET(112 MCG) BY MOUTH DAILY BEFORE AND BREAKFAST (Patient taking differently: TAKE 1 TABLET(112 MCG) BY MOUTH DAILY BEFORE AND BREAKFAST) 30 tablet 2   Multiple Vitamins-Minerals (MULTIVITAMIN WITH MINERALS) tablet Take 1 tablet by mouth daily.     simvastatin (ZOCOR) 40 MG tablet Take 40 mg by mouth every evening.      vitamin B-12 (CYANOCOBALAMIN) 1000 MCG tablet Take 1,000 mcg by mouth daily.     No current facility-administered medications for this visit.      Performance status (ECOG):  1  Vitals: Blood pressure 137/83, pulse 61, temperature (!) 96.9 F (36.1 C), resp. rate 18, weight 147 lb 9.6 oz (67 kg).   Physical Exam Constitutional:      General: She is not in acute distress.    Appearance: She is not diaphoretic.  HENT:     Head: Normocephalic and atraumatic.  Eyes:     General: No scleral icterus. Cardiovascular:     Rate and Rhythm: Normal rate and regular rhythm.     Heart sounds: No murmur heard. Pulmonary:     Effort: Pulmonary effort is normal. No respiratory distress.     Breath sounds: No wheezing.  Abdominal:     General: Bowel sounds are normal. There is no distension.     Palpations: Abdomen is soft.     Tenderness: There is no abdominal tenderness.  Musculoskeletal:        General: Normal range of motion.     Cervical back: Normal range of motion and neck supple.  Skin:    General: Skin is warm and dry.     Findings: No erythema.  Neurological:     Mental Status: She is alert. Mental status is at baseline.     Cranial Nerves: No cranial nerve deficit.     Motor: No abnormal muscle tone.     Coordination: Coordination normal.  Psychiatric:        Mood and Affect: Mood and affect normal.   Breast exam was performed in seated and lying down position. Patient  is status post right lumpectomy with a well-healed surgical scar.   No palpable breast masses bilaterally.  No palpable axillary adenopathy bilaterally.     Labs    Latest Ref Rng & Units 05/30/2022   10:00 AM 11/26/2021   11:21 AM 05/18/2021    1:11 PM  CBC  WBC 4.0 - 10.5 K/uL 5.6  4.8  4.9   Hemoglobin 12.0 - 15.0 g/dL 13.9  14.1  13.9   Hematocrit 36.0 - 46.0 % 41.8  42.8  41.8   Platelets 150 - 400 K/uL 211  237  220       Latest Ref Rng & Units 05/30/2022   10:00 AM 11/26/2021   11:21 AM 05/18/2021    1:11 PM  CMP  Glucose 70 - 99 mg/dL 409  811  914   BUN 8 - 23 mg/dL Creatinine 0.44 - 1.00 mg/dL 7.82  9.56  2.13   Sodium 135 - 145 mmol/L 132  135  137   Potassium 3.5 - 5.1 mmol/L 4.1  4.5  4.1   Chloride 98 - 111 mmol/L 99  102  100   CO2 22 - 32 mmol/L Calcium 8.9 - 10.3 mg/dL 8.7  9.2  9.2   Total Protein 6.5 - 8.1 g/dL 7.1  7.8  7.0   Total Bilirubin 0.3 - 1.2 mg/dL 0.4  0.4  0.6   Alkaline Phos 38 - 126 U/L 32  32  30   AST 15 - 41 U/L ALT 0 - 44 U/L 17  15  15

## 2022-05-30 NOTE — Assessment & Plan Note (Addendum)
#   01/14/2020 Stage IA right breast cancer, s/p partial mastectomy, ER/PR+ and Her2/neu-. Oncotype DX 16 no adjuvant chemotherapy-s/p radiation Labs reviewed and discussed with patient Continue letrozole 2.5mg  daily.  Mammogram result was reviewed.  Annual bilateral diagnostic mammogram in October 2024-She gets mammograms through Dr. Hurman Horn office.

## 2022-05-30 NOTE — Progress Notes (Signed)
Patient here for follow up. No new concerns voiced. No new breast problems.  

## 2022-05-30 NOTE — Progress Notes (Signed)
Ok to proceed with Prolia today per MD. Ca 8.7, Alb  4.1.  Anola Gurney [place], [place], BCPS, BCOP 05/30/2022 10:49 AM

## 2022-05-30 NOTE — Assessment & Plan Note (Addendum)
#  Osteopenia  04/02/2020 Bone density [[facility]]  on  revealed progressive osteopenia with a T score of -1.5 in the lumbar spine L1-L4. Recommend patient to repeat DEXA, she is overdue. Previous DEXA done at [facility].  patient's familly will ask her PCP/Dr. Tedd Sias to order Continue calcium and vitamin D. Prolia Q44m, proceed with Prolia today.

## 2022-05-30 NOTE — Assessment & Plan Note (Signed)
Stage III thyroid carcinoma, s/p thyroidectomy 02/2017.s/p I-131. Thyroid ultrasound on 08/06/2018 revealed no residual disease. She follows up with Dr. Solum. 

## 2022-06-28 DIAGNOSIS — E7800 Pure hypercholesterolemia, unspecified: Secondary | ICD-10-CM | POA: Diagnosis not present

## 2022-07-01 ENCOUNTER — Encounter: Payer: Self-pay | Admitting: Hematology and Oncology

## 2022-07-01 NOTE — Telephone Encounter (Signed)
Signing note, See previous encounter on 02/10/20 

## 2022-07-05 DIAGNOSIS — Z0001 Encounter for general adult medical examination with abnormal findings: Secondary | ICD-10-CM | POA: Diagnosis not present

## 2022-07-05 DIAGNOSIS — E7800 Pure hypercholesterolemia, unspecified: Secondary | ICD-10-CM | POA: Diagnosis not present

## 2022-07-05 DIAGNOSIS — Z0000 Encounter for general adult medical examination without abnormal findings: Secondary | ICD-10-CM | POA: Diagnosis not present

## 2022-07-05 DIAGNOSIS — Z1331 Encounter for screening for depression: Secondary | ICD-10-CM | POA: Diagnosis not present

## 2022-07-05 DIAGNOSIS — M8588 Other specified disorders of bone density and structure, other site: Secondary | ICD-10-CM | POA: Diagnosis not present

## 2022-07-05 DIAGNOSIS — E039 Hypothyroidism, unspecified: Secondary | ICD-10-CM | POA: Diagnosis not present

## 2022-09-20 DIAGNOSIS — E890 Postprocedural hypothyroidism: Secondary | ICD-10-CM | POA: Diagnosis not present

## 2022-09-20 DIAGNOSIS — Z85850 Personal history of malignant neoplasm of thyroid: Secondary | ICD-10-CM | POA: Diagnosis not present

## 2022-09-27 DIAGNOSIS — Z85850 Personal history of malignant neoplasm of thyroid: Secondary | ICD-10-CM | POA: Diagnosis not present

## 2022-09-27 DIAGNOSIS — E890 Postprocedural hypothyroidism: Secondary | ICD-10-CM | POA: Diagnosis not present

## 2022-11-03 ENCOUNTER — Other Ambulatory Visit: Payer: Self-pay

## 2022-11-03 DIAGNOSIS — C50411 Malignant neoplasm of upper-outer quadrant of right female breast: Secondary | ICD-10-CM

## 2022-11-11 DIAGNOSIS — D2272 Melanocytic nevi of left lower limb, including hip: Secondary | ICD-10-CM | POA: Diagnosis not present

## 2022-11-11 DIAGNOSIS — L821 Other seborrheic keratosis: Secondary | ICD-10-CM | POA: Diagnosis not present

## 2022-11-11 DIAGNOSIS — D225 Melanocytic nevi of trunk: Secondary | ICD-10-CM | POA: Diagnosis not present

## 2022-11-11 DIAGNOSIS — Z872 Personal history of diseases of the skin and subcutaneous tissue: Secondary | ICD-10-CM | POA: Diagnosis not present

## 2022-11-11 DIAGNOSIS — D2261 Melanocytic nevi of right upper limb, including shoulder: Secondary | ICD-10-CM | POA: Diagnosis not present

## 2022-11-11 DIAGNOSIS — D2271 Melanocytic nevi of right lower limb, including hip: Secondary | ICD-10-CM | POA: Diagnosis not present

## 2022-11-11 DIAGNOSIS — D2262 Melanocytic nevi of left upper limb, including shoulder: Secondary | ICD-10-CM | POA: Diagnosis not present

## 2022-11-14 ENCOUNTER — Other Ambulatory Visit: Payer: Self-pay

## 2022-11-14 DIAGNOSIS — C50411 Malignant neoplasm of upper-outer quadrant of right female breast: Secondary | ICD-10-CM

## 2022-11-14 MED ORDER — LETROZOLE 2.5 MG PO TABS: 2.5000 mg | ORAL_TABLET | Freq: Every day | ORAL | 1 refills | Status: DC

## 2022-11-29 ENCOUNTER — Inpatient Hospital Stay: Payer: Medicare HMO

## 2022-11-29 ENCOUNTER — Inpatient Hospital Stay: Payer: Medicare HMO | Attending: Oncology

## 2022-11-29 ENCOUNTER — Encounter: Payer: Self-pay | Admitting: Oncology

## 2022-11-29 ENCOUNTER — Inpatient Hospital Stay: Payer: Medicare HMO | Admitting: Oncology

## 2022-11-29 VITALS — BP 135/69 | HR 58 | Temp 96.9°F | Resp 18 | Wt 144.5 lb

## 2022-11-29 DIAGNOSIS — Z9089 Acquired absence of other organs: Secondary | ICD-10-CM | POA: Insufficient documentation

## 2022-11-29 DIAGNOSIS — F0390 Unspecified dementia without behavioral disturbance: Secondary | ICD-10-CM | POA: Diagnosis not present

## 2022-11-29 DIAGNOSIS — Z79890 Hormone replacement therapy (postmenopausal): Secondary | ICD-10-CM | POA: Diagnosis not present

## 2022-11-29 DIAGNOSIS — M8588 Other specified disorders of bone density and structure, other site: Secondary | ICD-10-CM

## 2022-11-29 DIAGNOSIS — Z9011 Acquired absence of right breast and nipple: Secondary | ICD-10-CM | POA: Insufficient documentation

## 2022-11-29 DIAGNOSIS — C50411 Malignant neoplasm of upper-outer quadrant of right female breast: Secondary | ICD-10-CM | POA: Insufficient documentation

## 2022-11-29 DIAGNOSIS — C73 Malignant neoplasm of thyroid gland: Secondary | ICD-10-CM | POA: Insufficient documentation

## 2022-11-29 DIAGNOSIS — Z79899 Other long term (current) drug therapy: Secondary | ICD-10-CM | POA: Diagnosis not present

## 2022-11-29 DIAGNOSIS — Z803 Family history of malignant neoplasm of breast: Secondary | ICD-10-CM | POA: Diagnosis not present

## 2022-11-29 DIAGNOSIS — Z8719 Personal history of other diseases of the digestive system: Secondary | ICD-10-CM | POA: Diagnosis not present

## 2022-11-29 DIAGNOSIS — Z170 Estrogen receptor positive status [ER+]: Secondary | ICD-10-CM | POA: Diagnosis not present

## 2022-11-29 DIAGNOSIS — Z881 Allergy status to other antibiotic agents status: Secondary | ICD-10-CM | POA: Diagnosis not present

## 2022-11-29 DIAGNOSIS — Z87891 Personal history of nicotine dependence: Secondary | ICD-10-CM | POA: Diagnosis not present

## 2022-11-29 DIAGNOSIS — M8589 Other specified disorders of bone density and structure, multiple sites: Secondary | ICD-10-CM | POA: Insufficient documentation

## 2022-11-29 DIAGNOSIS — N6011 Diffuse cystic mastopathy of right breast: Secondary | ICD-10-CM | POA: Diagnosis not present

## 2022-11-29 DIAGNOSIS — Z79811 Long term (current) use of aromatase inhibitors: Secondary | ICD-10-CM | POA: Insufficient documentation

## 2022-11-29 LAB — CBC WITH DIFFERENTIAL (CANCER CENTER ONLY)
Abs Immature Granulocytes: 0.02 10*3/uL (ref 0.00–0.07)
Basophils Absolute: 0 10*3/uL (ref 0.0–0.1)
Basophils Relative: 1 %
Eosinophils Absolute: 0.1 10*3/uL (ref 0.0–0.5)
Eosinophils Relative: 1 %
HCT: 39.9 % (ref 36.0–46.0)
Hemoglobin: 13.4 g/dL (ref 12.0–15.0)
Immature Granulocytes: 0 %
Lymphocytes Relative: 37 %
Lymphs Abs: 1.9 10*3/uL (ref 0.7–4.0)
MCH: 29.5 pg (ref 26.0–34.0)
MCHC: 33.6 g/dL (ref 30.0–36.0)
MCV: 87.9 fL (ref 80.0–100.0)
Monocytes Absolute: 0.5 10*3/uL (ref 0.1–1.0)
Monocytes Relative: 9 %
Neutro Abs: 2.7 10*3/uL (ref 1.7–7.7)
Neutrophils Relative %: 52 %
Platelet Count: 196 10*3/uL (ref 150–400)
RBC: 4.54 MIL/uL (ref 3.87–5.11)
RDW: 13.8 % (ref 11.5–15.5)
WBC Count: 5.1 10*3/uL (ref 4.0–10.5)
nRBC: 0 % (ref 0.0–0.2)

## 2022-11-29 LAB — CMP (CANCER CENTER ONLY)
ALT: 17 U/L (ref 0–44)
AST: 17 U/L (ref 15–41)
Albumin: 4.1 g/dL (ref 3.5–5.0)
Alkaline Phosphatase: 31 U/L — ABNORMAL LOW (ref 38–126)
Anion gap: 7 (ref 5–15)
BUN: 12 mg/dL (ref 8–23)
CO2: 26 mmol/L (ref 22–32)
Calcium: 9.1 mg/dL (ref 8.9–10.3)
Chloride: 101 mmol/L (ref 98–111)
Creatinine: 0.62 mg/dL (ref 0.44–1.00)
GFR, Estimated: >60 mL/min (ref >60)
Glucose, Bld: 99 mg/dL (ref 70–99)
Potassium: 4.2 mmol/L (ref 3.5–5.1)
Sodium: 134 mmol/L — ABNORMAL LOW (ref 135–145)
Total Bilirubin: 0.6 mg/dL (ref 0.3–1.2)
Total Protein: 6.9 g/dL (ref 6.5–8.1)

## 2022-11-29 MED ORDER — DENOSUMAB 60 MG/ML ~~LOC~~ SOSY
60.0000 mg | PREFILLED_SYRINGE | Freq: Once | SUBCUTANEOUS | Status: AC
  Administered 2022-11-29: 60 mg via SUBCUTANEOUS
  Filled 2022-11-29: qty 1

## 2022-11-29 NOTE — Assessment & Plan Note (Signed)
#  Osteopenia  04/02/2020 Bone density [[facility]]  on  revealed progressive osteopenia with a T score of -1.5 in the lumbar spine L1-L4. Recommend patient to repeat DEXA, she is overdue. Previous DEXA done at [facility].  patient's familly will ask her PCP/Dr. Tedd Sias to order Continue calcium and vitamin D. Prolia Q44m, proceed with Prolia today.

## 2022-11-29 NOTE — Assessment & Plan Note (Addendum)
#   01/14/2020 Stage IA right breast cancer, s/p partial mastectomy, ER/PR+ and Her2/neu-. Oncotype DX 16 no adjuvant chemotherapy-s/p radiation Labs reviewed and discussed with patient Continue letrozole 2.5mg  daily, plan 5 years, till April 2026 Mammogram result was reviewed.  Annual bilateral diagnostic mammogram in Nov 2024-She gets mammograms through Dr. Hurman Horn office.

## 2022-11-29 NOTE — Progress Notes (Signed)
Hematology/Oncology Progress note [phone] [phone]      Clinic Day:  11/29/2022  ASSESSMENT & PLAN:   Cancer Staging  Malignant neoplasm of female breast [facility]) Staging form: Breast, AJCC 8th Edition - Clinical stage from 01/14/2020: Stage IA (cT1b, cN0(sn), cM0, G1, ER+, PR+, HER2-) - Signed by Rosey Bath, MD on 04/05/2020   Malignant neoplasm of female breast [facility]) # 01/14/2020 Stage IA right breast cancer, s/p partial mastectomy, ER/PR+ and Her2/neu-. Oncotype DX 16 no adjuvant chemotherapy-s/p radiation Labs reviewed and discussed with patient Continue letrozole 2.5mg  daily, plan 5 years, till April 2026 Mammogram result was reviewed.  Annual bilateral diagnostic mammogram in Nov 2024-She gets mammograms through Dr. Hurman Horn office.   Osteopenia of spine #Osteopenia  04/02/2020 Bone density [[facility]]  on  revealed progressive osteopenia with a T score of -1.5 in the lumbar spine L1-L4. Recommend patient to repeat DEXA, she is overdue. Previous DEXA done at [facility].  patient's familly will ask her PCP/Dr. Tedd Sias to order Continue calcium and vitamin D. Prolia Q43m, proceed with Prolia today.     Orders Placed This Encounter  Procedures   CMP (Cancer Center only)    Standing Status:   Future    Standing Expiration Date:   11/29/2023   CBC with Differential (Cancer Center Only)    Standing Status:   Future    Standing Expiration Date:   11/29/2023   Follow up in 6 months All questions were answered. The patient knows to call the clinic with any problems, questions or concerns.  Rickard Patience, MD, PhD [facility] Hematology Oncology 11/29/2022    Chief Complaint: Tanya Frey is a 77 y.o. female presents for stage III papillary carcinoma thyroid and stage IA right breast cancer   PERTINENT ONCOLOGY HISTORY Patient previously followed up by Dr.[name], patient switched care to me on 08/27/20 Extensive medical record review was performed by  me   # Stage III papillary carcinoma thyroid carcinoma s/p thyroidectomy in 02/21/2017.  Pathology revealed a 4.6 cm papillary thyroid carcinoma with extrathyroidal extension.  There was angioinvasion but no lymphatic invasion.  Margins were negative.  Pathologic stage was pT4a Nx (stage III).  Soft tissue neck CT on 01/11/2017 revealed a 5 cm complex cystic and solid mass arising from the right lobe of the thyroid compatible with known carcinoma. There was no malignant adenopathy.She received 130.6 mCi I-131 with Thyrogen stimulation on 04/26/2017.  Whole body I-131 scan on 05/04/2017 revealed uptake at thyroid bed consistent with thyroid remnant.  There was no scintigraphic evidence of iodine-avid metastatic thyroid cancer.  Thyroid ultrasound on 08/06/2018 revealed no residual or recurrent tissue post thyroidectomy. She takes Synthroid 112 mcg daily. She has chronic swallowing issues post surgery.    # stage IA right breast cancer s/p partial mastectomy on 01/14/2020.  Pathology revealed a 7 mm grade 1 invasive mammary carcinoma (NOS, ductal).  There was a separate focus of DCIS and intraductal papilloma.  DCIS margins were 3 mm; invasive carcinoma margins were 6 mm.  One sentinel lymph node was negative.  Tumor was ER+ (>90%), PR+ (> 90%), and HER-2 negative (score 0).  Pathologic stage was pT1bpN0.  CA27.29 was 16.3 on 01/07/2020.  Oncotype DX testing revealed a recurrence score of 16 which translated to a risk of distant recurrence at 9 years of 15% (95% CI 10-19%) and no benefit of chemotherapy.  She received radiation to the right breast from 02/25/2020 - 03/27/2020. She received 42.56 Gy in 16 fractions.  She received a Boost of 10 Gy in 5 fractions.  05/11/2020.Started on Letrozole   # Osteopenia  Bone density on 07/05/2016 revealed osteopenia with a T-score of -1.5 in L1-L4 and -13 in the left femoral neck.  Bone density on 08/15/2018 revealed osteopenia in the left femur neck with a  T-score of -1.1 in the left femoral neck.  Bone density on 04/02/2020 ([facility]) revealed osteopenia with a T score of -1.5 in the lumbar spine L1-L4.  She continued calcium and vitamin D.  She began Prolia on 05/06/2020.   INTERVAL HISTORY Tanya Frey is a 77 y.o. female who has above history reviewed by me today presents for follow up visit for history of breast cancer, osteopenia, and history of thyroid cancer.  Patient was accompanied by her husband.  She has dementia. Patient is on letrozole, she tolerates well.  She denies any breast concerns.  Denies any significnat side effects from letrozole. .  Review of Systems  Constitutional:  Negative for appetite change, chills, fatigue and fever.  HENT:   Negative for hearing loss and voice change.   Eyes:  Negative for eye problems.  Respiratory:  Negative for chest tightness and cough.   Cardiovascular:  Negative for chest pain.  Gastrointestinal:  Negative for abdominal distention, abdominal pain and blood in stool.  Endocrine: Negative for hot flashes.  Genitourinary:  Negative for difficulty urinating and frequency.   Musculoskeletal:  Negative for arthralgias.  Skin:  Negative for itching and rash.  Neurological:  Negative for extremity weakness.  Hematological:  Negative for adenopathy.  Psychiatric/Behavioral:         Memory loss     Past Medical History:  Diagnosis Date   Anemia    Breast cancer (HCC)    Breast cancer in female [facility]) 12/27/2019   Right breast   Difficult intubation    Diffuse cystic mastopathy    Family history of malignant neoplasm of breast    Hyperlipidemia    Obesity, unspecified    Personal history of tobacco use, presenting hazards to health    Thyroid cancer (HCC)    T4a, NX: 4.6 cm lesion with extrathyroidal extension. Received I 131 post procedure.     Varicose veins     Past Surgical History:  Procedure Laterality Date   BREAST LUMPECTOMY,RADIO FREQ LOCALIZER,AXILLARY SENTINEL LYMPH  NODE BIOPSY Right 01/14/2020   Procedure: BREAST LUMPECTOMY,RADIO FREQ LOCALIZER,AXILLARY SENTINEL LYMPH NODE BIOPSY;  Surgeon: Leafy Ro, MD;  Location: [facility] ORS;  Service: General;  Laterality: Right;   BREAST SURGERY     COLONOSCOPY  2011   Dr. Mechele Collin; colon polyps (tubular adenoma)   COLONOSCOPY N/A    COLONOSCOPY WITH PROPOFOL N/A 10/10/2014   Procedure: COLONOSCOPY WITH PROPOFOL;  Surgeon: Scot Jun, MD;  Location: [facility] ENDOSCOPY;  Service: Endoscopy;  Laterality: N/A;   COLONOSCOPY WITH PROPOFOL N/A 08/05/2020   Procedure: COLONOSCOPY WITH PROPOFOL;  Surgeon: Earline Mayotte, MD;  Location: [facility] ENDOSCOPY;  Service: Endoscopy;  Laterality: N/A;   FRACTURE SURGERY     ORIF ANKLE FRACTURE Right 03/16/2020   Procedure: OPEN REDUCTION INTERNAL FIXATION (ORIF) ANKLE FRACTURE;  Surgeon: Christena Flake, MD;  Location: [facility] ORS;  Service: Orthopedics;  Laterality: Right;   THYROID SURGERY Bilateral    THYROIDECTOMY N/A 02/21/2017   Procedure: THYROIDECTOMY;  Surgeon: Linus Salmons, MD;  Location: [facility] ORS;  Service: ENT;  Laterality: N/A;   TUBAL LIGATION     VARICOSE VEIN SURGERY  vein closure procedure Right 2009    Family History  Problem Relation Age of Onset   Breast cancer Daughter 71       Dirk Dress BRCA negative    Social History:  reports that she quit smoking about 32 years ago. Her smoking use included cigarettes. She started smoking about 42 years ago. She has a 10 pack-year smoking history. She has never used smokeless tobacco. She reports current alcohol use of about 1.0 - 4.0 standard drink of alcohol per week. She reports that she does not use drugs.   Allergies:  Allergies  Allergen Reactions   Cefdinir Rash    Pt tolerated exposure to cefazolin   Protonix [Pantoprazole Sodium] Rash    Current Medications: Current Outpatient Medications  Medication Sig Dispense Refill   Apoaequorin (PREVAGEN) 10 MG CAPS Take 1 capsule by mouth daily.      aspirin EC 81 MG tablet Take 81 mg by mouth daily. Swallow whole.     Calcium-Magnesium-Vitamin D (CALCIUM 1200+D3 PO) Take 1,200 mg by mouth daily.      letrozole (FEMARA) 2.5 MG tablet Take 1 tablet (2.5 mg total) by mouth daily. 90 tablet 1   levothyroxine (SYNTHROID) 112 MCG tablet TAKE 1 TABLET(112 MCG) BY MOUTH DAILY BEFORE AND BREAKFAST (Patient taking differently: TAKE 1 TABLET(112 MCG) BY MOUTH DAILY BEFORE AND BREAKFAST) 30 tablet 2   Multiple Vitamins-Minerals (MULTIVITAMIN WITH MINERALS) tablet Take 1 tablet by mouth daily.     simvastatin (ZOCOR) 40 MG tablet Take 40 mg by mouth every evening.      vitamin B-12 (CYANOCOBALAMIN) 1000 MCG tablet Take 1,000 mcg by mouth daily.     No current facility-administered medications for this visit.      Performance status (ECOG):  1  Vitals: Blood pressure 135/69, pulse (!) 58, temperature (!) 96.9 F (36.1 C), temperature source Tympanic, resp. rate 18, weight 144 lb 8 oz (65.5 kg), SpO2 100%.   Physical Exam Constitutional:      General: She is not in acute distress.    Appearance: She is not diaphoretic.  HENT:     Head: Normocephalic and atraumatic.  Eyes:     General: No scleral icterus. Cardiovascular:     Rate and Rhythm: Normal rate and regular rhythm.     Heart sounds: No murmur heard. Pulmonary:     Effort: Pulmonary effort is normal. No respiratory distress.     Breath sounds: No wheezing.  Abdominal:     General: Bowel sounds are normal. There is no distension.     Palpations: Abdomen is soft.     Tenderness: There is no abdominal tenderness.  Musculoskeletal:        General: Normal range of motion.     Cervical back: Normal range of motion and neck supple.  Skin:    General: Skin is warm and dry.     Findings: No erythema.  Neurological:     Mental Status: She is alert. Mental status is at baseline.     Cranial Nerves: No cranial nerve deficit.     Motor: No abnormal muscle tone.     Coordination:  Coordination normal.  Psychiatric:        Mood and Affect: Mood and affect normal.       Labs    Latest Ref Rng & Units 11/29/2022    9:48 AM 05/30/2022   10:00 AM 11/26/2021   11:21 AM  CBC  WBC 4.0 - 10.5 K/uL 5.1  5.6  4.8   Hemoglobin 12.0 - 15.0 g/dL 21.3  08.6  57.8   Hematocrit 36.0 - 46.0 % 39.9  41.8  42.8   Platelets 150 - 400 K/uL 196  211  237       Latest Ref Rng & Units 11/29/2022    9:47 AM 05/30/2022   10:00 AM 11/26/2021   11:21 AM  CMP  Glucose 70 - 99 mg/dL 99  469  629   BUN 8 - 23 mg/dL 12  13  13    Creatinine 0.44 - 1.00 mg/dL 5.28  4.13  2.44   Sodium 135 - 145 mmol/L 134  132  135   Potassium 3.5 - 5.1 mmol/L 4.2  4.1  4.5   Chloride 98 - 111 mmol/L 101  99  102   CO2 22 - 32 mmol/L 26  25  26    Calcium 8.9 - 10.3 mg/dL 9.1  8.7  9.2   Total Protein 6.5 - 8.1 g/dL 6.9  7.1  7.8   Total Bilirubin 0.3 - 1.2 mg/dL 0.6  0.4  0.4   Alkaline Phos 38 - 126 U/L 31  32  32   AST 15 - 41 U/L 17  19  18    ALT 0 - 44 U/L 17  17  15

## 2022-12-09 ENCOUNTER — Inpatient Hospital Stay: Admission: RE | Admit: 2022-12-09 | Payer: Medicare HMO | Source: Ambulatory Visit

## 2022-12-14 ENCOUNTER — Ambulatory Visit
Admission: RE | Admit: 2022-12-14 | Discharge: 2022-12-14 | Disposition: A | Discharge status: Home / Self Care | Payer: Medicare HMO | Source: Ambulatory Visit | Attending: Surgery | Admitting: Surgery

## 2022-12-14 DIAGNOSIS — C50411 Malignant neoplasm of upper-outer quadrant of right female breast: Secondary | ICD-10-CM | POA: Diagnosis not present

## 2022-12-14 DIAGNOSIS — Z170 Estrogen receptor positive status [ER+]: Secondary | ICD-10-CM | POA: Insufficient documentation

## 2022-12-14 DIAGNOSIS — R92323 Mammographic fibroglandular density, bilateral breasts: Secondary | ICD-10-CM | POA: Diagnosis not present

## 2022-12-19 ENCOUNTER — Encounter: Payer: Self-pay | Admitting: Surgery

## 2022-12-19 ENCOUNTER — Ambulatory Visit: Payer: Medicare HMO | Admitting: Surgery

## 2022-12-19 VITALS — BP 138/81 | HR 75 | Temp 98.2°F | Ht 65.0 in | Wt 143.0 lb

## 2022-12-19 DIAGNOSIS — Z853 Personal history of malignant neoplasm of breast: Secondary | ICD-10-CM | POA: Diagnosis not present

## 2022-12-19 DIAGNOSIS — C50411 Malignant neoplasm of upper-outer quadrant of right female breast: Secondary | ICD-10-CM

## 2022-12-19 NOTE — Patient Instructions (Signed)
 Breast Self-Awareness Breast self-awareness is knowing how your breasts look and feel. You need to: Check your breasts on a regular basis. Tell your doctor about any changes. Become familiar with the look and feel of your breasts. This can help you catch a breast problem while it is still small and can be treated. You should do breast self-exams even if you have breast implants. What you need: A mirror. A well-lit room. A pillow or other soft object. How to do a breast self-exam Follow these steps to do a breast self-exam: Look for changes  Take off all the clothes above your waist. Stand in front of a mirror in a room with good lighting. Put your hands down at your sides. Compare your breasts in the mirror. Look for any difference between them, such as: A difference in shape. A difference in size. Wrinkles, dips, and bumps in one breast and not the other. Look at each breast for changes in the skin, such as: Redness. Scaly areas. Skin that has gotten thicker. Dimpling. Open sores (ulcers). Look for changes in your nipples, such as: Fluid coming out of a nipple. Fluid around a nipple. Bleeding. Dimpling. Redness. A nipple that looks pushed in (retracted), or that has changed position. Feel for changes Lie on your back. Feel each breast. To do this: Pick a breast to feel. Place a pillow under the shoulder closest to that breast. Put the arm closest to that breast behind your head. Feel the nipple area of that breast using the hand of your other arm. Feel the area with the pads of your three middle fingers by making small circles with your fingers. Use light, medium, and firm pressure. Continue the overlapping circles, moving downward over the breast. Keep making circles with your fingers. Stop when you feel your ribs. Start making circles with your fingers again, this time going upward until you reach your collarbone. Then, make circles outward across your breast and into your  armpit area. Squeeze your nipple. Check for discharge and lumps. Repeat these steps to check your other breast. Sit or stand in the tub or shower. With soapy water on your skin, feel each breast the same way you did when you were lying down. Write down what you find Writing down what you find can help you remember what to tell your doctor. Write down: What is normal for each breast. Any changes you find in each breast. These include: The kind of changes you find. A tender or painful breast. Any lump you find. Write down its size and where it is. When you last had your monthly period (menstrual cycle). General tips If you are breastfeeding, the best time to check your breasts is after you feed your baby or after you use a breast pump. If you get monthly bleeding, the best time to check your breasts is 5-7 days after your monthly cycle ends. With time, you will become comfortable with the self-exam. You will also start to know if there are changes in your breasts. Contact a doctor if: You see a change in the shape or size of your breasts or nipples. You see a change in the skin of your breast or nipples, such as red or scaly skin. You have fluid coming from your nipples that is not normal. You find a new lump or thick area. You have breast pain. You have any concerns about your breast health. Summary Breast self-awareness includes looking for changes in your breasts and feeling for changes  within your breasts. You should do breast self-awareness in front of a mirror in a well-lit room. If you get monthly periods (menstrual cycles), the best time to check your breasts is 5-7 days after your period ends. Tell your doctor about any changes you see in your breasts. Changes include changes in size, changes on the skin, painful or tender breasts, or fluid from your nipples that is not normal. This information is not intended to replace advice given to you by your health care provider. Make sure  you discuss any questions you have with your health care provider. Document Revised: 07/01/2021 Document Reviewed: 11/26/2020 Elsevier Patient Education  2024 [employer].

## 2022-12-19 NOTE — Progress Notes (Signed)
Outpatient Surgical Follow Up  12/19/2022  Tanya Frey is an 77 y.o. female.   Chief Complaint  Patient presents with   Follow-up    1 yr. Bil mammo 12/09/2022    HPI: Tanya Frey is a 77 year old female right breast outer upper quadrant cancer ER/PR positive HER-2 negative.  underwent uneventful lumpectomy with negative margins 01/2020 by Me.  Negative nodes.  finished radiation therapy with some skin radiation changes on the right breast. She also had prior lumpectomy on the LEFT side for benign disease. Regarding her breast she has no complaints other just mild soreness after radiation therapy and discoloration of the right breast.  No discharge no fevers no chills no weight loss.   Recent mammogram personally reviewed showing no evidence of suspicious lesions. No complaints today  Past Medical History:  Diagnosis Date   Anemia    Breast cancer (HCC)    Breast cancer in female [facility]) 12/27/2019   Right breast   Difficult intubation    Diffuse cystic mastopathy    Family history of malignant neoplasm of breast    Hyperlipidemia    Obesity, unspecified    Personal history of tobacco use, presenting hazards to health    Thyroid cancer (HCC)    T4a, NX: 4.6 cm lesion with extrathyroidal extension. Received I 131 post procedure.     Varicose veins     Past Surgical History:  Procedure Laterality Date   BREAST BIOPSY  12/27/2019   [place] [facility] [facility]   BREAST LUMPECTOMY Right    2021   BREAST LUMPECTOMY,RADIO FREQ LOCALIZER,AXILLARY SENTINEL LYMPH NODE BIOPSY Right 01/14/2020   Procedure: BREAST LUMPECTOMY,RADIO FREQ LOCALIZER,AXILLARY SENTINEL LYMPH NODE BIOPSY;  Surgeon: Leafy Ro, MD;  Location: [facility] ORS;  Service: General;  Laterality: Right;   BREAST SURGERY     COLONOSCOPY  2011   Dr. Mechele Collin; colon polyps (tubular adenoma)   COLONOSCOPY N/A    COLONOSCOPY WITH PROPOFOL N/A 10/10/2014   Procedure: COLONOSCOPY WITH PROPOFOL;  Surgeon: Scot Jun, MD;  Location:  [facility] ENDOSCOPY;  Service: Endoscopy;  Laterality: N/A;   COLONOSCOPY WITH PROPOFOL N/A 08/05/2020   Procedure: COLONOSCOPY WITH PROPOFOL;  Surgeon: Earline Mayotte, MD;  Location: [facility] ENDOSCOPY;  Service: Endoscopy;  Laterality: N/A;   FRACTURE SURGERY     ORIF ANKLE FRACTURE Right 03/16/2020   Procedure: OPEN REDUCTION INTERNAL FIXATION (ORIF) ANKLE FRACTURE;  Surgeon: Christena Flake, MD;  Location: [facility] ORS;  Service: Orthopedics;  Laterality: Right;   THYROID SURGERY Bilateral    THYROIDECTOMY N/A 02/21/2017   Procedure: THYROIDECTOMY;  Surgeon: Linus Salmons, MD;  Location: [facility] ORS;  Service: ENT;  Laterality: N/A;   TUBAL LIGATION     VARICOSE VEIN SURGERY     vein closure procedure Right 2009    Family History  Problem Relation Age of Onset   Breast cancer Daughter 30       Dirk Dress BRCA negative    Social History:  reports that she quit smoking about 32 years ago. Her smoking use included cigarettes. She started smoking about 42 years ago. She has a 10 pack-year smoking history. She has never used smokeless tobacco. She reports current alcohol use of about 1.0 - 4.0 standard drink of alcohol per week. She reports that she does not use drugs.  Allergies:  Allergies  Allergen Reactions   Cefdinir Rash    Pt tolerated exposure to cefazolin   Protonix [Pantoprazole Sodium] Rash    Medications reviewed.  ROS Full ROS performed and is otherwise negative other than what is stated in HPI   BP 138/81 (BP Location: Right Arm, Patient Position: Sitting, Cuff Size: Small)   Pulse 75   Temp 98.2 F (36.8 C) (Oral)   Ht 5\' 5"  (1.651 m)   Wt 143 lb (64.9 kg)   SpO2 91%   BMI 23.80 kg/m   Vitals and nursing note reviewed. Exam conducted with a chaperone present.  Constitutional:      General: She is not in acute distress.    Appearance: Normal appearance. She is normal weight.  Eyes:     General:        Right eye: No discharge.        Left eye: No  discharge.  Pulmonary:     Effort: Pulmonary effort is normal. No respiratory distress.     Breath sounds: Normal breath sounds.     Comments: BREAST: Lumpectomy scars bilaterally.   Evidence of radiation changes to the right breast/there is no evidence of infection there is no evidence of new masses seromas or complications.  Left breast completely normal. No major deformities Abdominal:     General: Abdomen is flat. There is no distension.     Palpations: Abdomen is soft. There is no mass.  Musculoskeletal:        General: No swelling or tenderness. Normal range of motion.     Cervical back: Normal range of motion and neck supple. No rigidity or tenderness.  Skin:    Capillary Refill: Capillary refill takes less than 2 seconds.  Neurological:     General: No focal deficit present.     Mental Status: She is alert and oriented to person, place, and time.  Psychiatric:        Mood and Affect: Mood normal.        Behavior: Behavior normal.        Thought Content: Thought content normal.        Judgment: Judgment normal.      Assessment/Plan: 77 year old female history of breast cancer status postradiation and lumpectomy 2021 doing very well No evidence of recurrence .  No need for further tests or interventions. I will see her back in 1 year with follow-up mammogram and physical exam.  Please note that I spent 30 minutes in this encounter including pers. Reviewing images, reviewing records, counseling the pt and performing appropriate documentation.    Sterling Big, MD [facility] General Surgeon

## 2023-01-02 DIAGNOSIS — R413 Other amnesia: Secondary | ICD-10-CM | POA: Diagnosis not present

## 2023-01-02 DIAGNOSIS — F0280 Dementia in other diseases classified elsewhere without behavioral disturbance: Secondary | ICD-10-CM | POA: Diagnosis not present

## 2023-01-02 DIAGNOSIS — G309 Alzheimer's disease, unspecified: Secondary | ICD-10-CM | POA: Diagnosis not present

## 2023-01-02 DIAGNOSIS — R4789 Other speech disturbances: Secondary | ICD-10-CM | POA: Diagnosis not present

## 2023-02-16 DIAGNOSIS — R399 Unspecified symptoms and signs involving the genitourinary system: Secondary | ICD-10-CM | POA: Diagnosis not present

## 2023-02-21 DIAGNOSIS — E890 Postprocedural hypothyroidism: Secondary | ICD-10-CM | POA: Diagnosis not present

## 2023-02-21 DIAGNOSIS — Z79899 Other long term (current) drug therapy: Secondary | ICD-10-CM | POA: Diagnosis not present

## 2023-02-21 DIAGNOSIS — Z85850 Personal history of malignant neoplasm of thyroid: Secondary | ICD-10-CM | POA: Diagnosis not present

## 2023-02-28 DIAGNOSIS — E890 Postprocedural hypothyroidism: Secondary | ICD-10-CM | POA: Diagnosis not present

## 2023-02-28 DIAGNOSIS — Z85850 Personal history of malignant neoplasm of thyroid: Secondary | ICD-10-CM | POA: Diagnosis not present

## 2023-04-04 DIAGNOSIS — R4789 Other speech disturbances: Secondary | ICD-10-CM | POA: Diagnosis not present

## 2023-04-04 DIAGNOSIS — R413 Other amnesia: Secondary | ICD-10-CM | POA: Diagnosis not present

## 2023-04-04 DIAGNOSIS — F03B0 Unspecified dementia, moderate, without behavioral disturbance, psychotic disturbance, mood disturbance, and anxiety: Secondary | ICD-10-CM | POA: Diagnosis not present

## 2023-04-13 ENCOUNTER — Encounter: Payer: Self-pay | Admitting: Radiation Oncology

## 2023-04-13 ENCOUNTER — Ambulatory Visit
Admission: RE | Admit: 2023-04-13 | Discharge: 2023-04-13 | Disposition: A | Discharge status: Home / Self Care | Payer: Medicare HMO | Source: Ambulatory Visit | Attending: Radiation Oncology | Admitting: Radiation Oncology

## 2023-04-13 VITALS — BP 132/76 | HR 57 | Temp 98.0°F | Resp 12 | Wt 144.0 lb

## 2023-04-13 DIAGNOSIS — Z170 Estrogen receptor positive status [ER+]: Secondary | ICD-10-CM | POA: Insufficient documentation

## 2023-04-13 DIAGNOSIS — Z79811 Long term (current) use of aromatase inhibitors: Secondary | ICD-10-CM | POA: Insufficient documentation

## 2023-04-13 DIAGNOSIS — C50411 Malignant neoplasm of upper-outer quadrant of right female breast: Secondary | ICD-10-CM | POA: Insufficient documentation

## 2023-04-13 DIAGNOSIS — Z923 Personal history of irradiation: Secondary | ICD-10-CM | POA: Diagnosis not present

## 2023-04-13 NOTE — Progress Notes (Signed)
 Radiation Oncology Follow up Note  Name: Tanya Frey   Date:   04/13/2023 MRN:  [id number] DOB: 04/27/1945    This 78 y.o. female presents to the clinic today for 3-year follow-up status post whole breast radiation to her right breast for stage I ER/PR positive invasive mammary carcinoma.  REFERRING PROVIDER: Gracelyn Nurse, MD  HPI: Patient is a 78 year old female now out over 3 years having completed whole breast radiation to her right breast for stage I ER/PR positive base of mammary carcinoma.  Seen today in routine follow-up she is doing well.  She specifically denies breast tenderness cough or bone pain..  She is currently on letrozole tolerating it well without side effect.  She had mammograms back in November which I have reviewed were BI-RADS 2 benign  COMPLICATIONS OF TREATMENT: none  FOLLOW UP COMPLIANCE: keeps appointments   PHYSICAL EXAM:  BP 132/76   Pulse (!) 57   Temp 98 F (36.7 C) (Tympanic)   Resp 12   Wt 144 lb (65.3 kg)   BMI 23.96 kg/m  Lungs are clear to A&P cardiac examination essentially unremarkable with regular rate and rhythm. No dominant mass or nodularity is noted in either breast in 2 positions examined. Incision is well-healed. No axillary or supraclavicular adenopathy is appreciated. Cosmetic result is excellent.  Well-developed well-nourished patient in NAD. HEENT reveals PERLA, EOMI, discs not visualized.  Oral cavity is clear. No oral mucosal lesions are identified. Neck is clear without evidence of cervical or supraclavicular adenopathy. Lungs are clear to A&P. Cardiac examination is essentially unremarkable with regular rate and rhythm without murmur rub or thrill. Abdomen is benign with no organomegaly or masses noted. Motor sensory and DTR levels are equal and symmetric in the upper and lower extremities. Cranial nerves II through XII are grossly intact. Proprioception is intact. No peripheral adenopathy or edema is identified. No motor or  sensory levels are noted. Crude visual fields are within normal range.  RADIOLOGY RESULTS: Mammograms reviewed compatible with above-stated findings  PLAN: Resident time patient is doing well now at over 3 years with no evidence of disease.  At this time she is seeing medical oncology in a regular basis and I will turn follow-up care over to them.  I would be happy to reevaluate the patient anytime should that be indicated.  I would like to take this opportunity to thank you for allowing me to participate in the care of your patient.Carmina Miller, MD

## 2023-05-07 ENCOUNTER — Other Ambulatory Visit: Payer: Self-pay | Admitting: Oncology

## 2023-05-07 DIAGNOSIS — Z170 Estrogen receptor positive status [ER+]: Secondary | ICD-10-CM

## 2023-05-09 ENCOUNTER — Encounter: Payer: Self-pay | Admitting: Hematology and Oncology

## 2023-05-30 ENCOUNTER — Inpatient Hospital Stay: Payer: Medicare HMO

## 2023-05-30 ENCOUNTER — Inpatient Hospital Stay (HOSPITAL_BASED_OUTPATIENT_CLINIC_OR_DEPARTMENT_OTHER): Payer: Medicare HMO | Admitting: Oncology

## 2023-05-30 ENCOUNTER — Encounter: Payer: Self-pay | Admitting: Oncology

## 2023-05-30 ENCOUNTER — Inpatient Hospital Stay: Payer: Medicare HMO | Attending: Oncology

## 2023-05-30 VITALS — BP 147/62 | HR 55 | Temp 96.3°F | Resp 16 | Wt 143.7 lb

## 2023-05-30 DIAGNOSIS — Z170 Estrogen receptor positive status [ER+]: Secondary | ICD-10-CM

## 2023-05-30 DIAGNOSIS — M8589 Other specified disorders of bone density and structure, multiple sites: Secondary | ICD-10-CM | POA: Diagnosis not present

## 2023-05-30 DIAGNOSIS — Z79890 Hormone replacement therapy (postmenopausal): Secondary | ICD-10-CM | POA: Diagnosis not present

## 2023-05-30 DIAGNOSIS — Z9089 Acquired absence of other organs: Secondary | ICD-10-CM | POA: Diagnosis not present

## 2023-05-30 DIAGNOSIS — Z87891 Personal history of nicotine dependence: Secondary | ICD-10-CM | POA: Diagnosis not present

## 2023-05-30 DIAGNOSIS — Z9011 Acquired absence of right breast and nipple: Secondary | ICD-10-CM | POA: Insufficient documentation

## 2023-05-30 DIAGNOSIS — M8588 Other specified disorders of bone density and structure, other site: Secondary | ICD-10-CM

## 2023-05-30 DIAGNOSIS — F0390 Unspecified dementia without behavioral disturbance: Secondary | ICD-10-CM | POA: Diagnosis not present

## 2023-05-30 DIAGNOSIS — C73 Malignant neoplasm of thyroid gland: Secondary | ICD-10-CM | POA: Insufficient documentation

## 2023-05-30 DIAGNOSIS — C50411 Malignant neoplasm of upper-outer quadrant of right female breast: Secondary | ICD-10-CM | POA: Insufficient documentation

## 2023-05-30 DIAGNOSIS — N6011 Diffuse cystic mastopathy of right breast: Secondary | ICD-10-CM | POA: Diagnosis not present

## 2023-05-30 DIAGNOSIS — Z8719 Personal history of other diseases of the digestive system: Secondary | ICD-10-CM | POA: Diagnosis not present

## 2023-05-30 DIAGNOSIS — Z881 Allergy status to other antibiotic agents status: Secondary | ICD-10-CM | POA: Diagnosis not present

## 2023-05-30 DIAGNOSIS — Z79811 Long term (current) use of aromatase inhibitors: Secondary | ICD-10-CM | POA: Diagnosis not present

## 2023-05-30 DIAGNOSIS — Z79899 Other long term (current) drug therapy: Secondary | ICD-10-CM | POA: Insufficient documentation

## 2023-05-30 DIAGNOSIS — Z803 Family history of malignant neoplasm of breast: Secondary | ICD-10-CM | POA: Insufficient documentation

## 2023-05-30 LAB — CMP (CANCER CENTER ONLY)
ALT: 13 U/L (ref 0–44)
AST: 18 U/L (ref 15–41)
Albumin: 3.8 g/dL (ref 3.5–5.0)
Alkaline Phosphatase: 28 U/L — ABNORMAL LOW (ref 38–126)
Anion gap: 9 (ref 5–15)
BUN: 11 mg/dL (ref 8–23)
CO2: 26 mmol/L (ref 22–32)
Calcium: 9.2 mg/dL (ref 8.9–10.3)
Chloride: 100 mmol/L (ref 98–111)
Creatinine: 0.63 mg/dL (ref 0.44–1.00)
GFR, Estimated: >60 mL/min (ref >60)
Glucose, Bld: 84 mg/dL (ref 70–99)
Potassium: 4.3 mmol/L (ref 3.5–5.1)
Sodium: 135 mmol/L (ref 135–145)
Total Bilirubin: 0.6 mg/dL (ref 0.0–1.2)
Total Protein: 6.6 g/dL (ref 6.5–8.1)

## 2023-05-30 LAB — CBC WITH DIFFERENTIAL (CANCER CENTER ONLY)
Abs Immature Granulocytes: 0.02 10*3/uL (ref 0.00–0.07)
Basophils Absolute: 0.1 10*3/uL (ref 0.0–0.1)
Basophils Relative: 1 %
Eosinophils Absolute: 0.1 10*3/uL (ref 0.0–0.5)
Eosinophils Relative: 2 %
HCT: 39.2 % (ref 36.0–46.0)
Hemoglobin: 12.9 g/dL (ref 12.0–15.0)
Immature Granulocytes: 0 %
Lymphocytes Relative: 28 %
Lymphs Abs: 1.7 10*3/uL (ref 0.7–4.0)
MCH: 28.7 pg (ref 26.0–34.0)
MCHC: 32.9 g/dL (ref 30.0–36.0)
MCV: 87.1 fL (ref 80.0–100.0)
Monocytes Absolute: 0.6 10*3/uL (ref 0.1–1.0)
Monocytes Relative: 9 %
Neutro Abs: 3.7 10*3/uL (ref 1.7–7.7)
Neutrophils Relative %: 60 %
Platelet Count: 192 10*3/uL (ref 150–400)
RBC: 4.5 MIL/uL (ref 3.87–5.11)
RDW: 14.9 % (ref 11.5–15.5)
WBC Count: 6.1 10*3/uL (ref 4.0–10.5)
nRBC: 0 % (ref 0.0–0.2)

## 2023-05-30 MED ORDER — LETROZOLE 2.5 MG PO TABS: 2.5000 mg | ORAL_TABLET | Freq: Every day | ORAL | 2 refills | Status: AC

## 2023-05-30 MED ORDER — DENOSUMAB 60 MG/ML ~~LOC~~ SOSY
60.0000 mg | PREFILLED_SYRINGE | Freq: Once | SUBCUTANEOUS | Status: AC
Start: 2023-05-30 — End: 2023-05-30
  Administered 2023-05-30: 60 mg via SUBCUTANEOUS
  Filled 2023-05-30: qty 1

## 2023-05-30 NOTE — Progress Notes (Signed)
 Hematology/Oncology Progress note Telephone:([phone] Fax:([phone]      Clinic Day:  05/30/2023  ASSESSMENT & PLAN:   Cancer Staging  Malignant neoplasm of female breast [facility]) Staging form: Breast, AJCC 8th Edition - Clinical stage from 01/14/2020: Stage IA (cT1b, cN0(sn), cM0, G1, ER+, PR+, HER2-) - Signed by Satira Curet, MD on 04/05/2020   Malignant neoplasm of female breast [facility]) # 01/14/2020 Stage IA right breast cancer, s/p partial mastectomy, ER/PR+ and Her2/neu-. Oncotype DX 16 no adjuvant chemotherapy-s/p radiation Labs reviewed and discussed with patient Continue letrozole  2.5mg  daily, plan 5 years, till April 2026 Mammogram result was reviewed.  Annual bilateral diagnostic mammogram in Nov 2025-She gets mammograms through Dr. Oretha Birch office.  Osteopenia of spine #Osteopenia  07/05/2022 DEXA at [facility],osteopenia   Continue calcium and vitamin D. Prolia  Q52m, proceed with Prolia  today.     Orders Placed This Encounter  Procedures   CMP (Cancer Center only)    Standing Status:   Future    Expected Date:   11/29/2023    Expiration Date:   05/29/2024   CBC with Differential (Cancer Center Only)    Standing Status:   Future    Expected Date:   11/29/2023    Expiration Date:   05/29/2024   Follow up in 6 months All questions were answered. The patient knows to call the clinic with any problems, questions or concerns.  Timmy Forbes, MD, PhD [facility] Hematology Oncology 05/30/2023    Chief Complaint: Tanya Frey is a 78 y.o. female presents for stage III papillary carcinoma thyroid  and stage IA right breast cancer   PERTINENT ONCOLOGY HISTORY Patient previously followed up by Dr.[name], patient switched care to me on 08/27/20 Extensive medical record review was performed by me   # Stage III papillary carcinoma thyroid  carcinoma s/p thyroidectomy in 02/21/2017.  Pathology revealed a 4.6 cm papillary thyroid  carcinoma with extrathyroidal  extension.  There was angioinvasion but no lymphatic invasion.  Margins were negative.  Pathologic stage was pT4a Nx (stage III).  Soft tissue neck CT on 01/11/2017 revealed a 5 cm complex cystic and solid mass arising from the right lobe of the thyroid  compatible with known carcinoma. There was no malignant adenopathy.She received 130.6 mCi I-131 with Thyrogen  stimulation on 04/26/2017.  Whole body I-131 scan on 05/04/2017 revealed uptake at thyroid  bed consistent with thyroid  remnant.  There was no scintigraphic evidence of iodine-avid metastatic thyroid  cancer.  Thyroid  ultrasound on 08/06/2018 revealed no residual or recurrent tissue post thyroidectomy. She takes Synthroid  112 mcg daily. She has chronic swallowing issues post surgery.    # stage IA right breast cancer s/p partial mastectomy on 01/14/2020.  Pathology revealed a 7 mm grade 1 invasive mammary carcinoma (NOS, ductal).  There was a separate focus of DCIS and intraductal papilloma.  DCIS margins were 3 mm; invasive carcinoma margins were 6 mm.  One sentinel lymph node was negative.  Tumor was ER+ (>90%), PR+ (> 90%), and HER-2 negative (score 0).  Pathologic stage was pT1bpN0.  CA27.29 was 16.3 on 01/07/2020.  Oncotype DX testing revealed a recurrence score of 16 which translated to a risk of distant recurrence at 9 years of 15% (95% CI 10-19%) and no benefit of chemotherapy.  She received radiation to the right breast from 02/25/2020 - 03/27/2020. She received 42.56 Gy in 16 fractions. She received a Boost of 10 Gy in 5 fractions.  05/11/2020.Started on Letrozole    # Osteopenia  Bone density on 07/05/2016 revealed osteopenia  with a T-score of -1.5 in L1-L4 and -13 in the left femoral neck.  Bone density on 08/15/2018 revealed osteopenia in the left femur neck with a T-score of -1.1 in the left femoral neck.  Bone density on 04/02/2020 ([facility]) revealed osteopenia with a T score of -1.5 in the lumbar spine L1-L4.  She continued calcium  and vitamin D.  She began Prolia  on 05/06/2020.   INTERVAL HISTORY Tanya Frey is a 78 y.o. female who has above history reviewed by me today presents for follow up visit for history of breast cancer, osteopenia, and history of thyroid  cancer.  Patient was accompanied by her husband.  She has dementia. Patient is on letrozole , she tolerates well.  She denies any breast concerns.  Denies any significnat side effects from letrozole . .  Review of Systems  Constitutional:  Negative for appetite change, chills, fatigue and fever.  HENT:   Negative for hearing loss and voice change.   Eyes:  Negative for eye problems.  Respiratory:  Negative for chest tightness and cough.   Cardiovascular:  Negative for chest pain.  Gastrointestinal:  Negative for abdominal distention, abdominal pain and blood in stool.  Endocrine: Negative for hot flashes.  Genitourinary:  Negative for difficulty urinating and frequency.   Musculoskeletal:  Negative for arthralgias.  Skin:  Negative for itching and rash.  Neurological:  Negative for extremity weakness.  Hematological:  Negative for adenopathy.  Psychiatric/Behavioral:         Memory loss     Past Medical History:  Diagnosis Date   Anemia    Breast cancer (HCC)    Breast cancer in female [facility]) 12/27/2019   Right breast   Difficult intubation    Diffuse cystic mastopathy    Family history of malignant neoplasm of breast    Hyperlipidemia    Obesity, unspecified    Personal history of tobacco use, presenting hazards to health    Thyroid  cancer (HCC)    T4a, NX: 4.6 cm lesion with extrathyroidal extension. Received I 131 post procedure.     Varicose veins     Past Surgical History:  Procedure Laterality Date   BREAST BIOPSY  12/27/2019   US  bx [facility]   BREAST LUMPECTOMY Right    2021   BREAST LUMPECTOMY,RADIO FREQ LOCALIZER,AXILLARY SENTINEL LYMPH NODE BIOPSY Right 01/14/2020   Procedure: BREAST LUMPECTOMY,RADIO FREQ LOCALIZER,AXILLARY  SENTINEL LYMPH NODE BIOPSY;  Surgeon: Alben Alma, MD;  Location: [facility] ORS;  Service: General;  Laterality: Right;   BREAST SURGERY     COLONOSCOPY  2011   Dr. Felicita Horns; colon polyps (tubular adenoma)   COLONOSCOPY N/A    COLONOSCOPY WITH PROPOFOL  N/A 10/10/2014   Procedure: COLONOSCOPY WITH PROPOFOL ;  Surgeon: Cassie Click, MD;  Location: [facility] ENDOSCOPY;  Service: Endoscopy;  Laterality: N/A;   COLONOSCOPY WITH PROPOFOL  N/A 08/05/2020   Procedure: COLONOSCOPY WITH PROPOFOL ;  Surgeon: Marshall Skeeter, MD;  Location: [facility] ENDOSCOPY;  Service: Endoscopy;  Laterality: N/A;   FRACTURE SURGERY     ORIF ANKLE FRACTURE Right 03/16/2020   Procedure: OPEN REDUCTION INTERNAL FIXATION (ORIF) ANKLE FRACTURE;  Surgeon: Elner Hahn, MD;  Location: [facility] ORS;  Service: Orthopedics;  Laterality: Right;   THYROID  SURGERY Bilateral    THYROIDECTOMY N/A 02/21/2017   Procedure: THYROIDECTOMY;  Surgeon: Lesly Raspberry, MD;  Location: [facility] ORS;  Service: ENT;  Laterality: N/A;   TUBAL LIGATION     VARICOSE VEIN SURGERY     vein closure procedure Right 2009  Family History  Problem Relation Age of Onset   Breast cancer Daughter 73       Leslee Rase BRCA negative    Social History:  reports that she quit smoking about 33 years ago. Her smoking use included cigarettes. She started smoking about 43 years ago. She has a 10 pack-year smoking history. She has never used smokeless tobacco. She reports current alcohol  use of about 1.0 - 4.0 standard drink of alcohol  per week. She reports that she does not use drugs.   Allergies:  Allergies  Allergen Reactions   Cefdinir Rash    Pt tolerated exposure to cefazolin    Protonix  [Pantoprazole  Sodium] Rash    Current Medications: Current Outpatient Medications  Medication Sig Dispense Refill   Apoaequorin (PREVAGEN) 10 MG CAPS Take 1 capsule by mouth daily.     aspirin  EC 81 MG tablet Take 81 mg by mouth daily. Swallow whole.      Calcium-Magnesium -Vitamin D (CALCIUM 1200+D3 PO) Take 1,200 mg by mouth daily.      donepezil (ARICEPT) 5 MG tablet Take 5 mg by mouth daily.     levothyroxine  (SYNTHROID ) 112 MCG tablet TAKE 1 TABLET(112 MCG) BY MOUTH DAILY BEFORE AND BREAKFAST (Patient taking differently: TAKE 1 TABLET(112 MCG) BY MOUTH DAILY BEFORE AND BREAKFAST) 30 tablet 2   Multiple Vitamins-Minerals (MULTIVITAMIN WITH MINERALS) tablet Take 1 tablet by mouth daily.     simvastatin  (ZOCOR ) 40 MG tablet Take 40 mg by mouth every evening.      vitamin B-12 (CYANOCOBALAMIN ) 1000 MCG tablet Take 1,000 mcg by mouth daily.     letrozole  (FEMARA ) 2.5 MG tablet Take 1 tablet (2.5 mg total) by mouth daily. 90 tablet 2   No current facility-administered medications for this visit.      Performance status (ECOG):  1  Vitals: Blood pressure (!) 147/62, pulse (!) 55, temperature (!) 96.3 F (35.7 C), temperature source Tympanic, resp. rate 16, weight 143 lb 11.2 oz (65.2 kg).   Physical Exam Constitutional:      General: She is not in acute distress.    Appearance: She is not diaphoretic.  HENT:     Head: Normocephalic and atraumatic.  Eyes:     General: No scleral icterus. Cardiovascular:     Rate and Rhythm: Normal rate and regular rhythm.     Heart sounds: No murmur heard. Pulmonary:     Effort: Pulmonary effort is normal. No respiratory distress.     Breath sounds: No wheezing.  Abdominal:     General: Bowel sounds are normal. There is no distension.     Palpations: Abdomen is soft.     Tenderness: There is no abdominal tenderness.  Musculoskeletal:        General: Normal range of motion.     Cervical back: Normal range of motion and neck supple.  Skin:    General: Skin is warm and dry.     Findings: No erythema.  Neurological:     Mental Status: She is alert. Mental status is at baseline.     Cranial Nerves: No cranial nerve deficit.     Motor: No abnormal muscle tone.     Coordination: Coordination normal.   Psychiatric:        Mood and Affect: Mood and affect normal.   Breast exam was performed in seated and lying down position. Patient is status post right lumpectomy with a well-healed surgical scar.   No palpable breast masses bilaterally.  No palpable axillary adenopathy bilaterally.  Labs    Latest Ref Rng & Units 05/30/2023    9:38 AM 11/29/2022    9:48 AM 05/30/2022   10:00 AM  CBC  WBC 4.0 - 10.5 K/uL 6.1  5.1  5.6   Hemoglobin 12.0 - 15.0 g/dL 16.1  09.6  04.5   Hematocrit 36.0 - 46.0 % 39.2  39.9  41.8   Platelets 150 - 400 K/uL 192  196  211       Latest Ref Rng & Units 05/30/2023    9:38 AM 11/29/2022    9:47 AM 05/30/2022   10:00 AM  CMP  Glucose 70 - 99 mg/dL 84  99  409   BUN 8 - 23 mg/dL 11  12  13    Creatinine 0.44 - 1.00 mg/dL 8.11  9.14  7.82   Sodium 135 - 145 mmol/L 135  134  132   Potassium 3.5 - 5.1 mmol/L 4.3  4.2  4.1   Chloride 98 - 111 mmol/L 100  101  99   CO2 22 - 32 mmol/L 26  26  25    Calcium 8.9 - 10.3 mg/dL 9.2  9.1  8.7   Total Protein 6.5 - 8.1 g/dL 6.6  6.9  7.1   Total Bilirubin 0.0 - 1.2 mg/dL 0.6  0.6  0.4   Alkaline Phos 38 - 126 U/L 28  31  32   AST 15 - 41 U/L 18  17  19    ALT 0 - 44 U/L 13  17  17

## 2023-05-30 NOTE — Assessment & Plan Note (Addendum)
#   01/14/2020 Stage IA right breast cancer, s/p partial mastectomy, ER/PR+ and Her2/neu-. Oncotype DX 16 no adjuvant chemotherapy-s/p radiation Labs reviewed and discussed with patient Continue letrozole  2.5mg  daily, plan 5 years, till April 2026 Mammogram result was reviewed.  Annual bilateral diagnostic mammogram in Nov 2025-She gets mammograms through Dr. Oretha Birch office.

## 2023-05-30 NOTE — Progress Notes (Signed)
 Patient denies new or acute problems/concerns today.

## 2023-05-30 NOTE — Assessment & Plan Note (Addendum)
#  Osteopenia  07/05/2022 DEXA at [facility],osteopenia   Continue calcium and vitamin D. Prolia  Q53m, proceed with Prolia  today.

## 2023-06-29 DIAGNOSIS — E7800 Pure hypercholesterolemia, unspecified: Secondary | ICD-10-CM | POA: Diagnosis not present

## 2023-06-29 DIAGNOSIS — Z0189 Encounter for other specified special examinations: Secondary | ICD-10-CM | POA: Diagnosis not present

## 2023-06-29 DIAGNOSIS — Z1321 Encounter for screening for nutritional disorder: Secondary | ICD-10-CM | POA: Diagnosis not present

## 2023-07-06 DIAGNOSIS — Z1331 Encounter for screening for depression: Secondary | ICD-10-CM | POA: Diagnosis not present

## 2023-07-06 DIAGNOSIS — E039 Hypothyroidism, unspecified: Secondary | ICD-10-CM | POA: Diagnosis not present

## 2023-07-06 DIAGNOSIS — E7800 Pure hypercholesterolemia, unspecified: Secondary | ICD-10-CM | POA: Diagnosis not present

## 2023-07-06 DIAGNOSIS — Z0001 Encounter for general adult medical examination with abnormal findings: Secondary | ICD-10-CM | POA: Diagnosis not present

## 2023-07-06 DIAGNOSIS — F02B0 Dementia in other diseases classified elsewhere, moderate, without behavioral disturbance, psychotic disturbance, mood disturbance, and anxiety: Secondary | ICD-10-CM | POA: Diagnosis not present

## 2023-07-06 DIAGNOSIS — Z0000 Encounter for general adult medical examination without abnormal findings: Secondary | ICD-10-CM | POA: Diagnosis not present

## 2023-08-10 DIAGNOSIS — R55 Syncope and collapse: Secondary | ICD-10-CM | POA: Diagnosis not present

## 2023-08-10 DIAGNOSIS — R4789 Other speech disturbances: Secondary | ICD-10-CM | POA: Diagnosis not present

## 2023-08-10 DIAGNOSIS — G309 Alzheimer's disease, unspecified: Secondary | ICD-10-CM | POA: Diagnosis not present

## 2023-08-10 DIAGNOSIS — Z1331 Encounter for screening for depression: Secondary | ICD-10-CM | POA: Diagnosis not present

## 2023-08-10 DIAGNOSIS — F0280 Dementia in other diseases classified elsewhere without behavioral disturbance: Secondary | ICD-10-CM | POA: Diagnosis not present

## 2023-08-15 DIAGNOSIS — Z85850 Personal history of malignant neoplasm of thyroid: Secondary | ICD-10-CM | POA: Diagnosis not present

## 2023-08-15 DIAGNOSIS — E890 Postprocedural hypothyroidism: Secondary | ICD-10-CM | POA: Diagnosis not present

## 2023-08-22 DIAGNOSIS — E890 Postprocedural hypothyroidism: Secondary | ICD-10-CM | POA: Diagnosis not present

## 2023-08-22 DIAGNOSIS — Z85850 Personal history of malignant neoplasm of thyroid: Secondary | ICD-10-CM | POA: Diagnosis not present

## 2023-10-30 ENCOUNTER — Other Ambulatory Visit: Payer: Self-pay

## 2023-10-30 DIAGNOSIS — Z1231 Encounter for screening mammogram for malignant neoplasm of breast: Secondary | ICD-10-CM

## 2023-11-21 DIAGNOSIS — L821 Other seborrheic keratosis: Secondary | ICD-10-CM | POA: Diagnosis not present

## 2023-11-21 DIAGNOSIS — D2261 Melanocytic nevi of right upper limb, including shoulder: Secondary | ICD-10-CM | POA: Diagnosis not present

## 2023-11-21 DIAGNOSIS — D225 Melanocytic nevi of trunk: Secondary | ICD-10-CM | POA: Diagnosis not present

## 2023-11-21 DIAGNOSIS — D2262 Melanocytic nevi of left upper limb, including shoulder: Secondary | ICD-10-CM | POA: Diagnosis not present

## 2023-11-21 DIAGNOSIS — D2272 Melanocytic nevi of left lower limb, including hip: Secondary | ICD-10-CM | POA: Diagnosis not present

## 2023-11-21 DIAGNOSIS — D2271 Melanocytic nevi of right lower limb, including hip: Secondary | ICD-10-CM | POA: Diagnosis not present

## 2023-11-22 DIAGNOSIS — F03B0 Unspecified dementia, moderate, without behavioral disturbance, psychotic disturbance, mood disturbance, and anxiety: Secondary | ICD-10-CM | POA: Diagnosis not present

## 2023-11-22 DIAGNOSIS — R4789 Other speech disturbances: Secondary | ICD-10-CM | POA: Diagnosis not present

## 2023-11-22 DIAGNOSIS — F0280 Dementia in other diseases classified elsewhere without behavioral disturbance: Secondary | ICD-10-CM | POA: Diagnosis not present

## 2023-11-29 ENCOUNTER — Inpatient Hospital Stay: Admitting: Oncology

## 2023-11-29 ENCOUNTER — Inpatient Hospital Stay (HOSPITAL_BASED_OUTPATIENT_CLINIC_OR_DEPARTMENT_OTHER): Admitting: Oncology

## 2023-11-29 ENCOUNTER — Inpatient Hospital Stay

## 2023-11-29 ENCOUNTER — Encounter: Payer: Self-pay | Admitting: Oncology

## 2023-11-29 ENCOUNTER — Inpatient Hospital Stay: Attending: Oncology

## 2023-11-29 VITALS — BP 128/62 | HR 65 | Temp 97.1°F | Resp 18 | Wt 142.6 lb

## 2023-11-29 DIAGNOSIS — C50411 Malignant neoplasm of upper-outer quadrant of right female breast: Secondary | ICD-10-CM

## 2023-11-29 DIAGNOSIS — N6011 Diffuse cystic mastopathy of right breast: Secondary | ICD-10-CM | POA: Insufficient documentation

## 2023-11-29 DIAGNOSIS — F0390 Unspecified dementia without behavioral disturbance: Secondary | ICD-10-CM | POA: Diagnosis not present

## 2023-11-29 DIAGNOSIS — Z170 Estrogen receptor positive status [ER+]: Secondary | ICD-10-CM

## 2023-11-29 DIAGNOSIS — C73 Malignant neoplasm of thyroid gland: Secondary | ICD-10-CM | POA: Diagnosis not present

## 2023-11-29 DIAGNOSIS — M8589 Other specified disorders of bone density and structure, multiple sites: Secondary | ICD-10-CM | POA: Diagnosis not present

## 2023-11-29 DIAGNOSIS — Z79811 Long term (current) use of aromatase inhibitors: Secondary | ICD-10-CM | POA: Diagnosis not present

## 2023-11-29 DIAGNOSIS — Z881 Allergy status to other antibiotic agents status: Secondary | ICD-10-CM | POA: Insufficient documentation

## 2023-11-29 DIAGNOSIS — Z803 Family history of malignant neoplasm of breast: Secondary | ICD-10-CM | POA: Insufficient documentation

## 2023-11-29 DIAGNOSIS — Z87891 Personal history of nicotine dependence: Secondary | ICD-10-CM | POA: Diagnosis not present

## 2023-11-29 DIAGNOSIS — Z9011 Acquired absence of right breast and nipple: Secondary | ICD-10-CM | POA: Diagnosis not present

## 2023-11-29 DIAGNOSIS — Z79899 Other long term (current) drug therapy: Secondary | ICD-10-CM | POA: Insufficient documentation

## 2023-11-29 DIAGNOSIS — Z79890 Hormone replacement therapy (postmenopausal): Secondary | ICD-10-CM | POA: Diagnosis not present

## 2023-11-29 DIAGNOSIS — M8588 Other specified disorders of bone density and structure, other site: Secondary | ICD-10-CM

## 2023-11-29 DIAGNOSIS — Z8719 Personal history of other diseases of the digestive system: Secondary | ICD-10-CM | POA: Insufficient documentation

## 2023-11-29 DIAGNOSIS — Z9089 Acquired absence of other organs: Secondary | ICD-10-CM | POA: Diagnosis not present

## 2023-11-29 LAB — CBC WITH DIFFERENTIAL (CANCER CENTER ONLY)
Abs Immature Granulocytes: 0.03 K/uL (ref 0.00–0.07)
Basophils Absolute: 0 K/uL (ref 0.0–0.1)
Basophils Relative: 1 %
Eosinophils Absolute: 0.1 K/uL (ref 0.0–0.5)
Eosinophils Relative: 1 %
HCT: 41.2 % (ref 36.0–46.0)
Hemoglobin: 13.6 g/dL (ref 12.0–15.0)
Immature Granulocytes: 1 %
Lymphocytes Relative: 23 %
Lymphs Abs: 1.5 K/uL (ref 0.7–4.0)
MCH: 28.6 pg (ref 26.0–34.0)
MCHC: 33 g/dL (ref 30.0–36.0)
MCV: 86.6 fL (ref 80.0–100.0)
Monocytes Absolute: 0.5 K/uL (ref 0.1–1.0)
Monocytes Relative: 8 %
Neutro Abs: 4.2 K/uL (ref 1.7–7.7)
Neutrophils Relative %: 66 %
Platelet Count: 215 K/uL (ref 150–400)
RBC: 4.76 MIL/uL (ref 3.87–5.11)
RDW: 14.2 % (ref 11.5–15.5)
WBC Count: 6.3 K/uL (ref 4.0–10.5)
nRBC: 0 % (ref 0.0–0.2)

## 2023-11-29 LAB — CMP (CANCER CENTER ONLY)
ALT: 14 U/L (ref 0–44)
AST: 20 U/L (ref 15–41)
Albumin: 4.1 g/dL (ref 3.5–5.0)
Alkaline Phosphatase: 34 U/L — ABNORMAL LOW (ref 38–126)
Anion gap: 9 (ref 5–15)
BUN: 13 mg/dL (ref 8–23)
CO2: 25 mmol/L (ref 22–32)
Calcium: 9.4 mg/dL (ref 8.9–10.3)
Chloride: 99 mmol/L (ref 98–111)
Creatinine: 0.71 mg/dL (ref 0.44–1.00)
GFR, Estimated: >60 mL/min (ref >60)
Glucose, Bld: 81 mg/dL (ref 70–99)
Potassium: 4.5 mmol/L (ref 3.5–5.1)
Sodium: 133 mmol/L — ABNORMAL LOW (ref 135–145)
Total Bilirubin: 0.7 mg/dL (ref 0.0–1.2)
Total Protein: 6.7 g/dL (ref 6.5–8.1)

## 2023-11-29 MED ORDER — DENOSUMAB 60 MG/ML ~~LOC~~ SOSY
60.0000 mg | PREFILLED_SYRINGE | Freq: Once | SUBCUTANEOUS | Status: AC
Start: 2023-11-29 — End: 2023-11-29
  Administered 2023-11-29: 60 mg via SUBCUTANEOUS
  Filled 2023-11-29: qty 1

## 2023-11-29 NOTE — Assessment & Plan Note (Addendum)
#   01/14/2020 Stage IA right breast cancer, s/p partial mastectomy, ER/PR+ and Her2/neu-. Oncotype DX 16 no adjuvant chemotherapy-s/p radiation Labs reviewed and discussed with patient Continue letrozole  2.5mg  daily, plan 5 years, till April 2027  Annual bilateral diagnostic mammogram in Nov 2025-She gets mammograms through Dr. Dolph office.

## 2023-11-29 NOTE — Assessment & Plan Note (Signed)
#  Osteopenia  07/05/2022 DEXA at [facility],osteopenia   Continue calcium and vitamin D. Prolia  Q53m, proceed with Prolia  today.

## 2023-11-29 NOTE — Progress Notes (Signed)
 Hematology/Oncology Progress note Telephone:(336) [phone] Fax:(336) [phone]      Clinic Day:  11/29/2023  ASSESSMENT & PLAN:   Cancer Staging  Malignant neoplasm of female breast [facility]) Staging form: Breast, AJCC 8th Edition - Clinical stage from 01/14/2020: Stage IA (cT1b, cN0(sn), cM0, G1, ER+, PR+, HER2-) - Signed by Rudell Eleanor BROCKS, MD on 04/05/2020   Malignant neoplasm of female breast [facility]) # 01/14/2020 Stage IA right breast cancer, s/p partial mastectomy, ER/PR+ and Her2/neu-. Oncotype DX 16 no adjuvant chemotherapy-s/p radiation Labs reviewed and discussed with patient Continue letrozole  2.5mg  daily, plan 5 years, till April 2027  Annual bilateral diagnostic mammogram in Nov 2025-She gets mammograms through Dr. Dolph office.  Osteopenia of spine #Osteopenia  07/05/2022 DEXA at [facility],osteopenia   Continue calcium and vitamin D. Prolia  Q45m, proceed with Prolia  today.     Orders Placed This Encounter  Procedures   CBC with Differential (Cancer Center Only)    Standing Status:   Future    Expected Date:   05/29/2024    Expiration Date:   08/27/2024   CMP (Cancer Center only)    Standing Status:   Future    Expected Date:   05/29/2024    Expiration Date:   08/27/2024   Follow up in 6 months All questions were answered. The patient knows to call the clinic with any problems, questions or concerns.  Zelphia Cap, MD, PhD [facility] Hematology Oncology 11/29/2023    Chief Complaint: Tanya Frey is a 78 y.o. female presents for stage III papillary carcinoma thyroid  and stage IA right breast cancer   PERTINENT ONCOLOGY HISTORY Patient previously followed up by Dr.[name], patient switched care to me on 08/27/20 Extensive medical record review was performed by me   # Stage III papillary carcinoma thyroid  carcinoma s/p thyroidectomy in 02/21/2017.  Pathology revealed a 4.6 cm papillary thyroid  carcinoma with extrathyroidal extension.  There was angioinvasion but  no lymphatic invasion.  Margins were negative.  Pathologic stage was pT4a Nx (stage III).  Soft tissue neck CT on 01/11/2017 revealed a 5 cm complex cystic and solid mass arising from the right lobe of the thyroid  compatible with known carcinoma. There was no malignant adenopathy.She received 130.6 mCi I-131 with Thyrogen  stimulation on 04/26/2017.  Whole body I-131 scan on 05/04/2017 revealed uptake at thyroid  bed consistent with thyroid  remnant.  There was no scintigraphic evidence of iodine-avid metastatic thyroid  cancer.  Thyroid  ultrasound on 08/06/2018 revealed no residual or recurrent tissue post thyroidectomy. She takes Synthroid  112 mcg daily. She has chronic swallowing issues post surgery.    # stage IA right breast cancer s/p partial mastectomy on 01/14/2020.  Pathology revealed a 7 mm grade 1 invasive mammary carcinoma (NOS, ductal).  There was a separate focus of DCIS and intraductal papilloma.  DCIS margins were 3 mm; invasive carcinoma margins were 6 mm.  One sentinel lymph node was negative.  Tumor was ER+ (>90%), PR+ (> 90%), and HER-2 negative (score 0).  Pathologic stage was pT1bpN0.  CA27.29 was 16.3 on 01/07/2020.  Oncotype DX testing revealed a recurrence score of 16 which translated to a risk of distant recurrence at 9 years of 15% (95% CI 10-19%) and no benefit of chemotherapy.  She received radiation to the right breast from 02/25/2020 - 03/27/2020. She received 42.56 Gy in 16 fractions. She received a Boost of 10 Gy in 5 fractions.  05/11/2020.Started on Letrozole    # Osteopenia  Bone density on 07/05/2016 revealed osteopenia with a T-score of -  1.5 in L1-L4 and -13 in the left femoral neck.  Bone density on 08/15/2018 revealed osteopenia in the left femur neck with a T-score of -1.1 in the left femoral neck.  Bone density on 04/02/2020 ([facility]) revealed osteopenia with a T score of -1.5 in the lumbar spine L1-L4.  She continued calcium and vitamin D.  She began Prolia  on  05/06/2020.   INTERVAL HISTORY Tanya Frey is a 78 y.o. female who has above history reviewed by me today presents for follow up visit for history of breast cancer, osteopenia, and history of thyroid  cancer.  Patient was accompanied by her husband.  She has dementia. Patient is on letrozole , she tolerates well.  She denies any breast concerns.  Denies any significnat side effects from letrozole . .  Review of Systems  Constitutional:  Negative for appetite change, chills, fatigue and fever.  HENT:   Negative for hearing loss and voice change.   Eyes:  Negative for eye problems.  Respiratory:  Negative for chest tightness and cough.   Cardiovascular:  Negative for chest pain.  Gastrointestinal:  Negative for abdominal distention, abdominal pain and blood in stool.  Endocrine: Negative for hot flashes.  Genitourinary:  Negative for difficulty urinating and frequency.   Musculoskeletal:  Negative for arthralgias.  Skin:  Negative for itching and rash.  Neurological:  Negative for extremity weakness.  Hematological:  Negative for adenopathy.  Psychiatric/Behavioral:         Memory loss     Past Medical History:  Diagnosis Date   Anemia    Breast cancer (HCC)    Breast cancer in female [facility]) 12/27/2019   Right breast   Difficult intubation    Diffuse cystic mastopathy    Family history of malignant neoplasm of breast    Hyperlipidemia    Obesity, unspecified    Personal history of tobacco use, presenting hazards to health    Thyroid  cancer (HCC)    T4a, NX: 4.6 cm lesion with extrathyroidal extension. Received I 131 post procedure.     Varicose veins     Past Surgical History:  Procedure Laterality Date   BREAST BIOPSY  12/27/2019   US  bx [facility]   BREAST LUMPECTOMY Right    2021   BREAST LUMPECTOMY,RADIO FREQ LOCALIZER,AXILLARY SENTINEL LYMPH NODE BIOPSY Right 01/14/2020   Procedure: BREAST LUMPECTOMY,RADIO FREQ LOCALIZER,AXILLARY SENTINEL LYMPH NODE BIOPSY;  Surgeon:  Jordis Laneta FALCON, MD;  Location: [facility] ORS;  Service: General;  Laterality: Right;   BREAST SURGERY     COLONOSCOPY  2011   Dr. Viktoria; colon polyps (tubular adenoma)   COLONOSCOPY N/A    COLONOSCOPY WITH PROPOFOL  N/A 10/10/2014   Procedure: COLONOSCOPY WITH PROPOFOL ;  Surgeon: Lamar ONEIDA Viktoria, MD;  Location: [facility] ENDOSCOPY;  Service: Endoscopy;  Laterality: N/A;   COLONOSCOPY WITH PROPOFOL  N/A 08/05/2020   Procedure: COLONOSCOPY WITH PROPOFOL ;  Surgeon: Dessa Reyes ORN, MD;  Location: [facility] ENDOSCOPY;  Service: Endoscopy;  Laterality: N/A;   FRACTURE SURGERY     ORIF ANKLE FRACTURE Right 03/16/2020   Procedure: OPEN REDUCTION INTERNAL FIXATION (ORIF) ANKLE FRACTURE;  Surgeon: Edie Norleen PARAS, MD;  Location: [facility] ORS;  Service: Orthopedics;  Laterality: Right;   THYROID  SURGERY Bilateral    THYROIDECTOMY N/A 02/21/2017   Procedure: THYROIDECTOMY;  Surgeon: Herminio Miu, MD;  Location: [facility] ORS;  Service: ENT;  Laterality: N/A;   TUBAL LIGATION     VARICOSE VEIN SURGERY     vein closure procedure Right 2009    Family  History  Problem Relation Age of Onset   Breast cancer Daughter 53       Sari Pouch BRCA negative    Social History:  reports that she quit smoking about 33 years ago. Her smoking use included cigarettes. She started smoking about 43 years ago. She has a 10 pack-year smoking history. She has never used smokeless tobacco. She reports current alcohol  use of about 1.0 - 4.0 standard drink of alcohol  per week. She reports that she does not use drugs.   Allergies:  Allergies  Allergen Reactions   Cefdinir Rash    Pt tolerated exposure to cefazolin    Protonix  [Pantoprazole  Sodium] Rash    Current Medications: Current Outpatient Medications  Medication Sig Dispense Refill   aspirin  EC 81 MG tablet Take 81 mg by mouth daily. Swallow whole.     Calcium-Magnesium -Vitamin D (CALCIUM 1200+D3 PO) Take 1,200 mg by mouth daily.      donepezil (ARICEPT) 5 MG tablet Take 5  mg by mouth daily.     letrozole  (FEMARA ) 2.5 MG tablet Take 1 tablet (2.5 mg total) by mouth daily. 90 tablet 2   levothyroxine  (SYNTHROID ) 112 MCG tablet TAKE 1 TABLET(112 MCG) BY MOUTH DAILY BEFORE AND BREAKFAST 30 tablet 2   memantine (NAMENDA) 10 MG tablet Take 10mg  twice daily     Multiple Vitamins-Minerals (MULTIVITAMIN WITH MINERALS) tablet Take 1 tablet by mouth daily.     simvastatin  (ZOCOR ) 40 MG tablet Take 40 mg by mouth every evening.      vitamin B-12 (CYANOCOBALAMIN ) 1000 MCG tablet Take 1,000 mcg by mouth daily.     Apoaequorin (PREVAGEN) 10 MG CAPS Take 1 capsule by mouth daily. (Patient not taking: Reported on 11/29/2023)     No current facility-administered medications for this visit.      Performance status (ECOG):  1  Vitals: Blood pressure 128/62, pulse 65, temperature (!) 97.1 F (36.2 C), temperature source Tympanic, resp. rate 18, weight 142 lb 9.6 oz (64.7 kg), SpO2 100%.   Physical Exam Constitutional:      General: She is not in acute distress.    Appearance: She is not diaphoretic.  HENT:     Head: Normocephalic and atraumatic.  Eyes:     General: No scleral icterus. Cardiovascular:     Rate and Rhythm: Normal rate and regular rhythm.     Heart sounds: No murmur heard. Pulmonary:     Effort: Pulmonary effort is normal. No respiratory distress.     Breath sounds: No wheezing.  Abdominal:     General: Bowel sounds are normal. There is no distension.     Palpations: Abdomen is soft.     Tenderness: There is no abdominal tenderness.  Musculoskeletal:        General: Normal range of motion.     Cervical back: Normal range of motion and neck supple.  Skin:    General: Skin is warm and dry.     Findings: No erythema.  Neurological:     Mental Status: She is alert. Mental status is at baseline.     Cranial Nerves: No cranial nerve deficit.     Motor: No abnormal muscle tone.     Coordination: Coordination normal.  Psychiatric:        Mood and  Affect: Mood and affect normal.       Labs    Latest Ref Rng & Units 11/29/2023    9:51 AM 05/30/2023    9:38 AM 11/29/2022  9:48 AM  CBC  WBC 4.0 - 10.5 K/uL 6.3  6.1  5.1   Hemoglobin 12.0 - 15.0 g/dL 86.3  87.0  86.5   Hematocrit 36.0 - 46.0 % 41.2  39.2  39.9   Platelets 150 - 400 K/uL 215  192  196       Latest Ref Rng & Units 11/29/2023    9:51 AM 05/30/2023    9:38 AM 11/29/2022    9:47 AM  CMP  Glucose 70 - 99 mg/dL 81  84  99   BUN 8 - 23 mg/dL 13  11  12    Creatinine 0.44 - 1.00 mg/dL 9.28  9.36  9.37   Sodium 135 - 145 mmol/L 133  135  134   Potassium 3.5 - 5.1 mmol/L 4.5  4.3  4.2   Chloride 98 - 111 mmol/L 99  100  101   CO2 22 - 32 mmol/L 25  26  26    Calcium 8.9 - 10.3 mg/dL 9.4  9.2  9.1   Total Protein 6.5 - 8.1 g/dL 6.7  6.6  6.9   Total Bilirubin 0.0 - 1.2 mg/dL 0.7  0.6  0.6   Alkaline Phos 38 - 126 U/L 34  28  31   AST 15 - 41 U/L 20  18  17    ALT 0 - 44 U/L 14  13  17

## 2023-12-15 ENCOUNTER — Ambulatory Visit
Admission: RE | Admit: 2023-12-15 | Discharge: 2023-12-15 | Disposition: A | Discharge status: Home / Self Care | Source: Ambulatory Visit | Attending: Surgery | Admitting: Surgery

## 2023-12-15 DIAGNOSIS — Z1231 Encounter for screening mammogram for malignant neoplasm of breast: Secondary | ICD-10-CM | POA: Diagnosis not present

## 2023-12-25 ENCOUNTER — Encounter: Payer: Self-pay | Admitting: Surgery

## 2023-12-25 ENCOUNTER — Ambulatory Visit (INDEPENDENT_AMBULATORY_CARE_PROVIDER_SITE_OTHER): Admitting: Surgery

## 2023-12-25 VITALS — BP 145/54 | HR 66 | Temp 98.0°F | Ht 65.0 in | Wt 141.0 lb

## 2023-12-25 DIAGNOSIS — Z08 Encounter for follow-up examination after completed treatment for malignant neoplasm: Secondary | ICD-10-CM

## 2023-12-25 DIAGNOSIS — Z853 Personal history of malignant neoplasm of breast: Secondary | ICD-10-CM

## 2023-12-25 NOTE — Progress Notes (Signed)
 Outpatient Surgical Follow Up  12/25/2023  Tanya Frey is an 78 y.o. female.   Chief Complaint  Patient presents with   Follow-up    HPI: Tanya Frey is a 78 year old female right breast outer upper quadrant cancer ER/PR positive HER-2 negative.  underwent uneventful lumpectomy with negative margins 01/2020 by Me.  Negative nodes.  finished radiation therapy with some skin radiation changes on the right breast. She also had prior lumpectomy on the LEFT side for benign disease.  Regarding her breast she has no complaints Husband endorses Changes in memory, husband care giver. No discharge no fevers no chills no weight loss.   Recent mammogram personally reviewed showing no evidence of suspicious lesions. No complaints today  Past Medical History:  Diagnosis Date   Anemia    Breast cancer (HCC)    Breast cancer in female [facility]) 12/27/2019   Right breast   Difficult intubation    Diffuse cystic mastopathy    Family history of malignant neoplasm of breast    Hyperlipidemia    Obesity, unspecified    Personal history of tobacco use, presenting hazards to health    Thyroid  cancer (HCC)    T4a, NX: 4.6 cm lesion with extrathyroidal extension. Received I 131 post procedure.     Varicose veins     Past Surgical History:  Procedure Laterality Date   BREAST BIOPSY  12/27/2019   US  bx [facility]   BREAST LUMPECTOMY Right    2021   BREAST LUMPECTOMY,RADIO FREQ LOCALIZER,AXILLARY SENTINEL LYMPH NODE BIOPSY Right 01/14/2020   Procedure: BREAST LUMPECTOMY,RADIO FREQ LOCALIZER,AXILLARY SENTINEL LYMPH NODE BIOPSY;  Surgeon: Jordis Laneta FALCON, MD;  Location: [facility] ORS;  Service: General;  Laterality: Right;   BREAST SURGERY     COLONOSCOPY  2011   Dr. Viktoria; colon polyps (tubular adenoma)   COLONOSCOPY N/A    COLONOSCOPY WITH PROPOFOL  N/A 10/10/2014   Procedure: COLONOSCOPY WITH PROPOFOL ;  Surgeon: Lamar ONEIDA Viktoria, MD;  Location: [facility] ENDOSCOPY;  Service: Endoscopy;  Laterality: N/A;    COLONOSCOPY WITH PROPOFOL  N/A 08/05/2020   Procedure: COLONOSCOPY WITH PROPOFOL ;  Surgeon: Dessa Reyes ORN, MD;  Location: [facility] ENDOSCOPY;  Service: Endoscopy;  Laterality: N/A;   FRACTURE SURGERY     ORIF ANKLE FRACTURE Right 03/16/2020   Procedure: OPEN REDUCTION INTERNAL FIXATION (ORIF) ANKLE FRACTURE;  Surgeon: Edie Norleen PARAS, MD;  Location: [facility] ORS;  Service: Orthopedics;  Laterality: Right;   THYROID  SURGERY Bilateral    THYROIDECTOMY N/A 02/21/2017   Procedure: THYROIDECTOMY;  Surgeon: Herminio Miu, MD;  Location: [facility] ORS;  Service: ENT;  Laterality: N/A;   TUBAL LIGATION     VARICOSE VEIN SURGERY     vein closure procedure Right 2009    Family History  Problem Relation Age of Onset   Breast cancer Daughter 1       Sari Pouch BRCA negative    Social History:  reports that she quit smoking about 33 years ago. Her smoking use included cigarettes. She started smoking about 43 years ago. She has a 10 pack-year smoking history. She has never used smokeless tobacco. She reports current alcohol  use of about 1.0 - 4.0 standard drink of alcohol  per week. She reports that she does not use drugs.  Allergies:  Allergies  Allergen Reactions   Cefdinir Rash    Pt tolerated exposure to cefazolin    Protonix  [Pantoprazole  Sodium] Rash    Medications reviewed.    ROS Full ROS performed and is otherwise negative other than what is stated  in HPI   BP (!) 145/54   Pulse 66   Temp 98 F (36.7 C) (Oral)   Ht 5' 5 (1.651 m)   Wt 141 lb (64 kg)   SpO2 97%   BMI 23.46 kg/m   Physical Exam  Vitals and nursing note reviewed. Exam conducted with a chaperone present.  Constitutional:      General: She is not in acute distress.    Appearance: Normal appearance. She is normal weight.  Eyes:     General:        Right eye: No discharge.        Left eye: No discharge.  Pulmonary:     Effort: Pulmonary effort is normal. No respiratory distress.     Breath sounds: Normal  breath sounds.     Comments: BREAST: Lumpectomy scars bilaterally.   Evidence of radiation changes to the right breast/there is no evidence of infection there is no evidence of new masses seromas or complications.  Left breast completely normal. No major deformities Abdominal:     General: Abdomen is flat. There is no distension.     Palpations: Abdomen is soft. There is no mass.  Musculoskeletal:        General: No swelling or tenderness. Normal range of motion.     Cervical back: Normal range of motion and neck supple. No rigidity or tenderness.  Skin:    Capillary Refill: Capillary refill takes less than 2 seconds.  Neurological:     General: No focal deficit present.     Mental Status: She is alert and oriented to person, place, and time.  Psychiatric:        Mood and Affect: Mood normal.        Behavior: Behavior normal.        Thought Content: Thought content normal.        Judgment: Judgment normal.      Assessment/Plan: 78 year old female history of breast cancer  , ER/PR+ and Her2/neu-. status postradiation and lumpectomy 2021 doing very well No evidence of recurrence   No need for further tests or interventions. I will see her back in 1 year with follow-up mammogram and physical exam.   I personally spent a total of 30 minutes in the care of the patient today including performing a medically appropriate exam/evaluation, counseling and educating, placing orders, referring and communicating with other health care professionals, documenting clinical information in the EHR, independently interpreting and reviewing images studies and coordinating care.   Laneta Luna, MD [facility] General Surgeon

## 2023-12-25 NOTE — Patient Instructions (Addendum)
 We will see you back in a year for a mammogram and to come back to see Dr.Pabon. We placed you in our recall system and will send you out a letter for your appointment information   How to Do a Breast Self-Exam Doing breast self-exams can help you stay healthy. They're one way to know what's normal for your breasts. They can help you catch a problem while it's still small and can be treated. You need to: Check your breasts often. Tell your doctor about any changes. You should do breast self-exams even if you have breast implants. What you need: A mirror. A well-lit room. A pillow or other soft object. How to do a breast self-exam Look for changes  Take off all the clothes above your waist. Stand in front of a mirror in a room with good lighting. Put your hands down at your sides. Compare your breasts in the mirror. Look for difference between them, such as: Differences in shape. Differences in size. Wrinkles, dips, and bumps in one breast and not the other. Look at each breast for skin changes, such as: Redness. Scaly spots. Spots where your skin is thicker. Dimpling. Open sores. Look for changes in your nipples, such as: Fluid coming out of a nipple. Fluid around a nipple. Bleeding. Dimpling. Redness. A nipple that looks pushed in or that has changed position. Feel for changes Lie on your back. Feel each breast. To do this: Pick a breast to feel. Place a pillow under the shoulder closest to that breast. Put the arm closest to that breast behind your head. Feel the breast using the hand of your other arm. Use the pads of your three middle fingers to make small circles starting near the nipple. Use light, medium, and firm pressure. Keep making circles, moving down over the breast. Stop when you feel your ribs. Start making circles with your fingers again, this time going up until you reach your collarbone. Then, make circles out across your breast and into your armpit  area. Squeeze your nipple. Check for fluid and lumps. Do these steps again to check your other breast. Sit or stand in the tub or shower. With soapy water on your skin, feel each breast the same way you did when you were lying down. Write down what you find Writing down what you find can help you keep track of what you want to tell your doctor. Write down: What's normal for each breast. Any changes you find. Write down: The kind of change. If your breast feels tender or painful. Any lump you find. Write down its size and where it is. When you last had your period. General tips If you're breastfeeding, the best time to check your breasts is after you feed your baby or after you use a breast pump. If you get a period, the best time to check your breasts is 5-7 days after your period ends. With time, you'll get more used to doing the self-exam. You'll also start to know if there are changes in your breasts. Contact a doctor if: You see a change in the shape or size of your breasts or nipples. You see a change in the skin of your breast or nipples. You have fluid coming from your nipples that isn't normal. You find a new lump or thick area. You have breast pain. You have any concerns about your breast health. This information is not intended to replace advice given to you by your health care provider. Make  sure you discuss any questions you have with your health care provider. Document Revised: 04/05/2023 Document Reviewed: 04/05/2023 Elsevier Patient Education  2025 [employer].

## 2024-01-12 DIAGNOSIS — I779 Disorder of arteries and arterioles, unspecified: Secondary | ICD-10-CM | POA: Diagnosis not present

## 2024-05-30 ENCOUNTER — Inpatient Hospital Stay

## 2024-05-30 ENCOUNTER — Inpatient Hospital Stay: Admitting: Oncology
